# Patient Record
Sex: Female | Born: 1965 | Race: White | Hispanic: No | Smoking: Former smoker
Health system: Southern US, Community
[De-identification: ages and names within clinical notes are randomized; demographics above are authoritative.]

## PROBLEM LIST (undated history)

## (undated) DIAGNOSIS — M797 Fibromyalgia: Secondary | ICD-10-CM

## (undated) DIAGNOSIS — J189 Pneumonia, unspecified organism: Secondary | ICD-10-CM

## (undated) DIAGNOSIS — K219 Gastro-esophageal reflux disease without esophagitis: Secondary | ICD-10-CM

## (undated) DIAGNOSIS — F32A Depression, unspecified: Secondary | ICD-10-CM

## (undated) DIAGNOSIS — M549 Dorsalgia, unspecified: Secondary | ICD-10-CM

## (undated) DIAGNOSIS — F329 Major depressive disorder, single episode, unspecified: Secondary | ICD-10-CM

## (undated) DIAGNOSIS — J449 Chronic obstructive pulmonary disease, unspecified: Secondary | ICD-10-CM

## (undated) DIAGNOSIS — I5181 Takotsubo syndrome: Secondary | ICD-10-CM

## (undated) DIAGNOSIS — I10 Essential (primary) hypertension: Principal | ICD-10-CM

## (undated) DIAGNOSIS — M542 Cervicalgia: Secondary | ICD-10-CM

## (undated) DIAGNOSIS — F419 Anxiety disorder, unspecified: Secondary | ICD-10-CM

## (undated) DIAGNOSIS — K589 Irritable bowel syndrome without diarrhea: Secondary | ICD-10-CM

## (undated) HISTORY — PX: CHOLECYSTECTOMY: SHX55

## (undated) HISTORY — PX: OTHER SURGICAL HISTORY: SHX169

## (undated) HISTORY — DX: Essential (primary) hypertension: I10

## (undated) HISTORY — PX: CARDIAC CATHETERIZATION: SHX172

## (undated) HISTORY — PX: ABDOMINAL HYSTERECTOMY: SHX81

---

## 2013-05-02 ENCOUNTER — Inpatient Hospital Stay (HOSPITAL_COMMUNITY)
Admission: AD | Admit: 2013-05-02 | Discharge: 2013-05-06 | DRG: 125 | Disposition: A | Discharge status: Home / Self Care | Payer: BC Managed Care – PPO | Source: Other Acute Inpatient Hospital | Attending: Cardiology | Admitting: Cardiology

## 2013-05-02 DIAGNOSIS — Z881 Allergy status to other antibiotic agents status: Secondary | ICD-10-CM

## 2013-05-02 DIAGNOSIS — F329 Major depressive disorder, single episode, unspecified: Secondary | ICD-10-CM | POA: Diagnosis present

## 2013-05-02 DIAGNOSIS — I214 Non-ST elevation (NSTEMI) myocardial infarction: Secondary | ICD-10-CM | POA: Diagnosis present

## 2013-05-02 DIAGNOSIS — Z9981 Dependence on supplemental oxygen: Secondary | ICD-10-CM

## 2013-05-02 DIAGNOSIS — F17200 Nicotine dependence, unspecified, uncomplicated: Secondary | ICD-10-CM | POA: Diagnosis not present

## 2013-05-02 DIAGNOSIS — K219 Gastro-esophageal reflux disease without esophagitis: Secondary | ICD-10-CM | POA: Diagnosis present

## 2013-05-02 DIAGNOSIS — M542 Cervicalgia: Secondary | ICD-10-CM | POA: Diagnosis present

## 2013-05-02 DIAGNOSIS — F3289 Other specified depressive episodes: Secondary | ICD-10-CM | POA: Diagnosis present

## 2013-05-02 DIAGNOSIS — M549 Dorsalgia, unspecified: Secondary | ICD-10-CM | POA: Diagnosis present

## 2013-05-02 DIAGNOSIS — J449 Chronic obstructive pulmonary disease, unspecified: Secondary | ICD-10-CM | POA: Diagnosis present

## 2013-05-02 DIAGNOSIS — R079 Chest pain, unspecified: Secondary | ICD-10-CM

## 2013-05-02 DIAGNOSIS — K589 Irritable bowel syndrome without diarrhea: Secondary | ICD-10-CM | POA: Diagnosis present

## 2013-05-02 DIAGNOSIS — F419 Anxiety disorder, unspecified: Secondary | ICD-10-CM | POA: Diagnosis present

## 2013-05-02 DIAGNOSIS — I5181 Takotsubo syndrome: Principal | ICD-10-CM | POA: Diagnosis present

## 2013-05-02 DIAGNOSIS — E876 Hypokalemia: Secondary | ICD-10-CM | POA: Diagnosis present

## 2013-05-02 DIAGNOSIS — Z7982 Long term (current) use of aspirin: Secondary | ICD-10-CM

## 2013-05-02 DIAGNOSIS — F411 Generalized anxiety disorder: Secondary | ICD-10-CM | POA: Diagnosis present

## 2013-05-02 DIAGNOSIS — IMO0001 Reserved for inherently not codable concepts without codable children: Secondary | ICD-10-CM | POA: Diagnosis present

## 2013-05-02 DIAGNOSIS — R7309 Other abnormal glucose: Secondary | ICD-10-CM | POA: Diagnosis present

## 2013-05-02 DIAGNOSIS — E43 Unspecified severe protein-calorie malnutrition: Secondary | ICD-10-CM | POA: Insufficient documentation

## 2013-05-02 DIAGNOSIS — T380X5A Adverse effect of glucocorticoids and synthetic analogues, initial encounter: Secondary | ICD-10-CM | POA: Diagnosis present

## 2013-05-02 DIAGNOSIS — IMO0002 Reserved for concepts with insufficient information to code with codable children: Secondary | ICD-10-CM

## 2013-05-02 DIAGNOSIS — I2589 Other forms of chronic ischemic heart disease: Secondary | ICD-10-CM | POA: Diagnosis present

## 2013-05-02 DIAGNOSIS — J441 Chronic obstructive pulmonary disease with (acute) exacerbation: Secondary | ICD-10-CM | POA: Diagnosis not present

## 2013-05-02 DIAGNOSIS — R739 Hyperglycemia, unspecified: Secondary | ICD-10-CM | POA: Diagnosis present

## 2013-05-02 HISTORY — DX: Irritable bowel syndrome, unspecified: K58.9

## 2013-05-02 HISTORY — DX: Chronic obstructive pulmonary disease, unspecified: J44.9

## 2013-05-02 HISTORY — DX: Takotsubo syndrome: I51.81

## 2013-05-02 HISTORY — DX: Pneumonia, unspecified organism: J18.9

## 2013-05-02 HISTORY — DX: Dorsalgia, unspecified: M54.9

## 2013-05-02 HISTORY — DX: Cervicalgia: M54.2

## 2013-05-02 HISTORY — DX: Anxiety disorder, unspecified: F41.9

## 2013-05-02 HISTORY — DX: Gastro-esophageal reflux disease without esophagitis: K21.9

## 2013-05-02 HISTORY — DX: Major depressive disorder, single episode, unspecified: F32.9

## 2013-05-02 HISTORY — DX: Depression, unspecified: F32.A

## 2013-05-02 HISTORY — DX: Fibromyalgia: M79.7

## 2013-05-02 LAB — MRSA PCR SCREENING: MRSA by PCR: NEGATIVE

## 2013-05-02 MED ORDER — ACETAMINOPHEN 325 MG PO TABS
650.0000 mg | ORAL_TABLET | ORAL | Status: DC | PRN
  Administered 2013-05-03 – 2013-05-05 (×3): 650 mg via ORAL
  Filled 2013-05-02 (×3): qty 2

## 2013-05-02 MED ORDER — BUDESONIDE-FORMOTEROL FUMARATE 160-4.5 MCG/ACT IN AERO
2.0000 | INHALATION_SPRAY | Freq: Two times a day (BID) | RESPIRATORY_TRACT | Status: DC
  Administered 2013-05-02 – 2013-05-04 (×4): 2 via RESPIRATORY_TRACT
  Filled 2013-05-02: qty 6

## 2013-05-02 MED ORDER — ALPRAZOLAM 0.5 MG PO TABS
1.0000 mg | ORAL_TABLET | Freq: Three times a day (TID) | ORAL | Status: DC | PRN
  Administered 2013-05-02 – 2013-05-03 (×3): 1 mg via ORAL
  Filled 2013-05-02: qty 2
  Filled 2013-05-02 (×2): qty 1
  Filled 2013-05-02: qty 2

## 2013-05-02 MED ORDER — PREDNISOLONE 5 MG PO TABS: 60.0000 mg | ORAL_TABLET | Freq: Every day | ORAL | Status: DC

## 2013-05-02 MED ORDER — TRAZODONE HCL 50 MG PO TABS
50.0000 mg | ORAL_TABLET | Freq: Every day | ORAL | Status: DC
  Administered 2013-05-02 – 2013-05-03 (×2): 50 mg via ORAL
  Filled 2013-05-02 (×3): qty 1

## 2013-05-02 MED ORDER — ONDANSETRON HCL 4 MG/2ML IJ SOLN: 4.0000 mg | Freq: Four times a day (QID) | INTRAMUSCULAR | Status: DC | PRN

## 2013-05-02 MED ORDER — ATORVASTATIN CALCIUM 80 MG PO TABS
80.0000 mg | ORAL_TABLET | Freq: Every day | ORAL | Status: DC
  Filled 2013-05-02 (×3): qty 1

## 2013-05-02 MED ORDER — ENOXAPARIN SODIUM 60 MG/0.6ML ~~LOC~~ SOLN
45.0000 mg | Freq: Two times a day (BID) | SUBCUTANEOUS | Status: DC
  Administered 2013-05-03 – 2013-05-04 (×3): 45 mg via SUBCUTANEOUS
  Filled 2013-05-02 (×5): qty 0.6

## 2013-05-02 MED ORDER — LEVOFLOXACIN 750 MG PO TABS
750.0000 mg | ORAL_TABLET | Freq: Every day | ORAL | Status: DC
  Administered 2013-05-03 – 2013-05-05 (×3): 750 mg via ORAL
  Filled 2013-05-02 (×4): qty 1

## 2013-05-02 MED ORDER — PREDNISONE 50 MG PO TABS
60.0000 mg | ORAL_TABLET | Freq: Every day | ORAL | Status: DC
  Administered 2013-05-03 – 2013-05-06 (×4): 60 mg via ORAL
  Filled 2013-05-02 (×5): qty 1

## 2013-05-02 MED ORDER — ASPIRIN EC 81 MG PO TBEC
81.0000 mg | DELAYED_RELEASE_TABLET | Freq: Every day | ORAL | Status: DC
  Administered 2013-05-03 – 2013-05-06 (×3): 81 mg via ORAL
  Filled 2013-05-02 (×4): qty 1

## 2013-05-02 MED ORDER — PANTOPRAZOLE SODIUM 40 MG PO TBEC
40.0000 mg | DELAYED_RELEASE_TABLET | Freq: Every day | ORAL | Status: DC
  Administered 2013-05-03: 40 mg via ORAL
  Filled 2013-05-02: qty 1

## 2013-05-02 MED ORDER — ZOLPIDEM TARTRATE 5 MG PO TABS: 5.0000 mg | ORAL_TABLET | Freq: Every evening | ORAL | Status: DC | PRN

## 2013-05-02 MED ORDER — GABAPENTIN 300 MG PO CAPS
600.0000 mg | ORAL_CAPSULE | Freq: Three times a day (TID) | ORAL | Status: DC
  Administered 2013-05-02 – 2013-05-03 (×4): 600 mg via ORAL
  Filled 2013-05-02 (×7): qty 2

## 2013-05-02 MED ORDER — DILTIAZEM HCL ER COATED BEADS 120 MG PO CP24
120.0000 mg | ORAL_CAPSULE | Freq: Every day | ORAL | Status: DC
  Administered 2013-05-02 – 2013-05-06 (×5): 120 mg via ORAL
  Filled 2013-05-02 (×6): qty 1

## 2013-05-02 MED ORDER — ALBUTEROL SULFATE (5 MG/ML) 0.5% IN NEBU
2.5000 mg | INHALATION_SOLUTION | RESPIRATORY_TRACT | Status: DC | PRN
  Administered 2013-05-02 – 2013-05-04 (×4): 2.5 mg via RESPIRATORY_TRACT
  Filled 2013-05-02 (×4): qty 0.5

## 2013-05-02 MED ORDER — NITROGLYCERIN 0.4 MG SL SUBL: 0.4000 mg | SUBLINGUAL_TABLET | SUBLINGUAL | Status: DC | PRN

## 2013-05-02 NOTE — H&P (Signed)
Sarah Chan is an 47 y.o. female.    Chief Complaint: Shortness of breath and chest pain  HPI: 47 y/o female with a PMH of oxygen-dependent COPD, intermittent tobacco abuse presenting as a transfer from [facility] where she originally presented for evaluation of progressive shortness of breath and chest pain.  She has a baseline of chronic dyspnnea on exertion after walking about 50-70 feet.  Over the last one week she complained of worsening of her baseline dyspnea on exertion that seems to occur when she started smoking again. She was seen by outside physician who started her on inhalers, antibiotics and Prednisone taper.  Over the last 4 days, she started to complain of rest chest pain which she describes as a tightness, 9/10, typically lasting for about a few minutes before spontaneous relief.  Her chest pain is non-radiating, associated with worsening shortness of breath and diaphoresis, but she denies nausea, vomiting, palpitation or syncope.  As part of her evaluation at [facility], she had EKG that showed sinus tachycardia with no ST elevation.  Her initial cardiac marker showed a troponin-I of 1.5 which is abnormal.  She was transferred to [facility] in consideration of cardiac catheterization.  She is currently chest pain free, but still complains of shortness of breath and has bilateral expiratory wheezes.   No past medical history on file.  1. Oxygen-dependent COPD 2. GERD 3. Fibromyalgia 4. Back Pain 5. Anxiety 6. Neck pain 7. Irritable bowel syndrome 8. Depression  No past surgical history on file.  1. Hysterectomy 2. Cholecystectomy 3. C-section  No family history on file.  No family history of premature CAD  Social History:  has no tobacco, alcohol, and drug history on file.  Smoke 1/2 pack per day of cigarette  Allergies: Allergies not on file  Dicyclomine   No prescriptions prior to admission  1.  Prednisone 60  Mg qd 2. Gabapentine  600 mg tid 3. Trazodone 50 mg qhs 4. Moxifloxacin 400 mg qd 5. Xanax 0.5 mg tid  No results found for this or any previous visit (from the past 48 hour(s)). No results found.  Review of Systems  Constitutional: Negative for fever, chills, weight loss, malaise/fatigue and diaphoresis.  HENT: Negative for hearing loss, ear pain, nosebleeds, congestion, sore throat, neck pain, tinnitus and ear discharge.   Eyes: Negative for blurred vision, double vision, photophobia, pain, discharge and redness.  Respiratory: Positive for shortness of breath and wheezing. Negative for cough, hemoptysis, sputum production and stridor.   Cardiovascular: Positive for chest pain and PND. Negative for palpitations, orthopnea, claudication and leg swelling.  Gastrointestinal: Negative for heartburn, nausea, vomiting, abdominal pain, diarrhea and constipation.  Genitourinary: Negative for dysuria and urgency.  Musculoskeletal: Positive for myalgias and back pain. Negative for joint pain.  Skin: Negative for itching and rash.  Neurological: Negative for dizziness, tingling, weakness and headaches.  Psychiatric/Behavioral: Negative for hallucinations.    Temperature 98 F (36.7 C), temperature source Oral. Physical Exam  Constitutional: She is oriented to person, place, and time. She appears well-developed and well-nourished. No distress.  HENT:  Head: Normocephalic.  Eyes: Pupils are equal, round, and reactive to light. Right eye exhibits no discharge. Left eye exhibits no discharge. No scleral icterus.  Neck: Normal range of motion. Neck supple. No JVD present. No tracheal deviation present. No thyromegaly present.  Cardiovascular: Regular rhythm.  Exam reveals no gallop and no friction rub.   No murmur heard. Tachycardic  Respiratory: No stridor. No respiratory  distress. She has wheezes. She has no rales. She exhibits no tenderness.  Poor air movement with bilateral expiratory wheezes  GI: Soft. Bowel  sounds are normal. She exhibits no distension. There is no tenderness. There is no rebound and no guarding.  Musculoskeletal: Normal range of motion. She exhibits no edema and no tenderness.  Neurological: She is alert and oriented to person, place, and time. No cranial nerve deficit. Coordination normal.  Skin: No rash noted. She is not diaphoretic. No erythema.  Psychiatric: She has a normal mood and affect.    EKG from 05/02/13 at 14:21 shows sinus tachycardia (104 bpm), anteroseptal Q waves (no old EKG for comparison).    Assessment/Plan  1.  COPD exacerbation:  Patient has oxygen-dependent COPD and still smokes cigarettes.  She is currently on Levofloxacin, Prednisone and inhalers.  We will also start her on prn nebulizer treatment.  She has been advised on smoking cessation  2.  NSTEMI: could be secondary to demand ischemia from severe COPD, but cannot exclude underlying CAD since she smokes.  She is currently on Aspirin, Lipitor and Lovenox.  We will hold off on beta-blockers as she is actively wheezing and substitute Diltiazem to decrease her heart rate.  We will obtain serial cardiac markers to see which was they are trending and obtain a TTE in the morning to evaluate her LV function. We will keep her NPO on Sunday night in consideration of a cardiac catheterization on Monday.  3. GERD: We will start the patient of PPI  4. Anxiety: patient is on Xanax prn  Akeiba Axelson E 05/02/2013, 10:27 PM

## 2013-05-02 NOTE — Progress Notes (Signed)
ANTICOAGULATION CONSULT NOTE - Initial Consult  Pharmacy Consult for Lovenox Indication: chest pain/ACS  Allergies  Allergen Reactions  . Doxycycline Nausea Only    Patient Measurements: Height: 5\' 3"  (160 cm) Weight: 98 lb 8.7 oz (44.7 kg) IBW/kg (Calculated) : 52.4  Vital Signs: Temp: 98 F (36.7 C) (07/26 2116) Temp src: Oral (07/26 2116) BP: 133/107 mmHg (07/26 2300)  Labs (at [facility]): CK-MB  8.6 Trop I    1.51  WBC 11.5 Hgb 13.1 Hct 42.3 Plt 225  SCr  0.63 No results found for this basename: HGB, HCT, PLT, APTT, LABPROT, INR, HEPARINUNFRC, CREATININE, CKTOTAL, CKMB, TROPONINI,  in the last 72 hours  CrCl is unknown because no creatinine reading has been taken.   Medical History: COPD  Htn  GERD  Fibromyalgia     Medications:  Symbicort  Prednisone  NEurontin  Trazodone  Avelox  Astepro  Spiriva  Norco  Xanax    Assessment: 47 yo female with elevated cardiac markers for Lovenox  Goal of Therapy:  Full anticoagulation with Lovenox Monitor platelets by anticoagulation protocol: Yes   Plan:  Lovenox 45 mg SQ q12h  Eddie Candle 05/02/2013,11:43 PM

## 2013-05-03 ENCOUNTER — Inpatient Hospital Stay (HOSPITAL_COMMUNITY): Payer: BC Managed Care – PPO

## 2013-05-03 ENCOUNTER — Encounter (HOSPITAL_COMMUNITY): Payer: Self-pay | Admitting: *Deleted

## 2013-05-03 DIAGNOSIS — Z9981 Dependence on supplemental oxygen: Secondary | ICD-10-CM | POA: Diagnosis not present

## 2013-05-03 DIAGNOSIS — I5181 Takotsubo syndrome: Secondary | ICD-10-CM | POA: Diagnosis not present

## 2013-05-03 DIAGNOSIS — J441 Chronic obstructive pulmonary disease with (acute) exacerbation: Secondary | ICD-10-CM | POA: Diagnosis not present

## 2013-05-03 DIAGNOSIS — F17200 Nicotine dependence, unspecified, uncomplicated: Secondary | ICD-10-CM | POA: Diagnosis not present

## 2013-05-03 DIAGNOSIS — I369 Nonrheumatic tricuspid valve disorder, unspecified: Secondary | ICD-10-CM

## 2013-05-03 LAB — COMPREHENSIVE METABOLIC PANEL
Albumin: 3.7 g/dL (ref 3.5–5.2)
BUN: 8 mg/dL (ref 6–23)
Creatinine, Ser: 0.55 mg/dL (ref 0.50–1.10)
GFR calc Af Amer: >90 mL/min (ref >90)
Glucose, Bld: 134 mg/dL — ABNORMAL HIGH (ref 70–99)
Total Protein: 6.8 g/dL (ref 6.0–8.3)

## 2013-05-03 LAB — PRO B NATRIURETIC PEPTIDE: Pro B Natriuretic peptide (BNP): 6072 pg/mL — ABNORMAL HIGH (ref 0–125)

## 2013-05-03 LAB — LIPID PANEL
Cholesterol: 164 mg/dL (ref 0–200)
VLDL: 15 mg/dL (ref 0–40)

## 2013-05-03 LAB — CBC WITH DIFFERENTIAL/PLATELET
Basophils Absolute: 0 10*3/uL (ref 0.0–0.1)
Basophils Relative: 0 % (ref 0–1)
Eosinophils Absolute: 0 10*3/uL (ref 0.0–0.7)
Eosinophils Relative: 0 % (ref 0–5)
MCH: 28.1 pg (ref 26.0–34.0)
MCHC: 30.1 g/dL (ref 30.0–36.0)
MCV: 93.5 fL (ref 78.0–100.0)
Platelets: 262 10*3/uL (ref 150–400)
RDW: 13.6 % (ref 11.5–15.5)
WBC: 9.9 10*3/uL (ref 4.0–10.5)

## 2013-05-03 LAB — TROPONIN I
Troponin I: 2.3 ng/mL (ref <0.30)
Troponin I: 2.32 ng/mL (ref <0.30)
Troponin I: 1.45 ng/mL (ref <0.30)

## 2013-05-03 LAB — CBC
Hemoglobin: 13 g/dL (ref 12.0–15.0)
MCH: 28.7 pg (ref 26.0–34.0)
MCV: 92.1 fL (ref 78.0–100.0)
Platelets: 245 10*3/uL (ref 150–400)
RBC: 4.53 MIL/uL (ref 3.87–5.11)
WBC: 9.4 10*3/uL (ref 4.0–10.5)

## 2013-05-03 LAB — BASIC METABOLIC PANEL
CO2: 36 mEq/L — ABNORMAL HIGH (ref 19–32)
Calcium: 8.9 mg/dL (ref 8.4–10.5)
Chloride: 99 mEq/L (ref 96–112)
Glucose, Bld: 128 mg/dL — ABNORMAL HIGH (ref 70–99)
Sodium: 141 mEq/L (ref 135–145)

## 2013-05-03 LAB — MAGNESIUM: Magnesium: 2 mg/dL (ref 1.5–2.5)

## 2013-05-03 LAB — HEMOGLOBIN A1C: Hgb A1c MFr Bld: 5.5 % (ref <5.7)

## 2013-05-03 LAB — PROTIME-INR: Prothrombin Time: 13.6 seconds (ref 11.6–15.2)

## 2013-05-03 LAB — TSH: TSH: 0.376 u[IU]/mL (ref 0.350–4.500)

## 2013-05-03 MED ORDER — DIAZEPAM 2 MG PO TABS
2.0000 mg | ORAL_TABLET | ORAL | Status: AC
  Administered 2013-05-04: 2 mg via ORAL
  Filled 2013-05-03: qty 1

## 2013-05-03 MED ORDER — ASPIRIN 81 MG PO CHEW
324.0000 mg | CHEWABLE_TABLET | ORAL | Status: AC
  Administered 2013-05-04: 324 mg via ORAL
  Filled 2013-05-03: qty 4

## 2013-05-03 MED ORDER — SODIUM CHLORIDE 0.9 % IJ SOLN
3.0000 mL | Freq: Two times a day (BID) | INTRAMUSCULAR | Status: DC
  Administered 2013-05-03 – 2013-05-05 (×4): 3 mL via INTRAVENOUS

## 2013-05-03 MED ORDER — SODIUM CHLORIDE 0.9 % IJ SOLN: 3.0000 mL | INTRAMUSCULAR | Status: DC | PRN

## 2013-05-03 MED ORDER — SODIUM CHLORIDE 0.9 % IV SOLN: 250.0000 mL | INTRAVENOUS | Status: DC | PRN

## 2013-05-03 MED ORDER — FLUTICASONE PROPIONATE 50 MCG/ACT NA SUSP
1.0000 | Freq: Every day | NASAL | Status: DC
  Administered 2013-05-03 – 2013-05-06 (×4): 1 via NASAL
  Filled 2013-05-03: qty 16

## 2013-05-03 NOTE — Progress Notes (Signed)
Subjective:  Patient remains very dyspneic. Still has dull aching anterior chest discomfort.  Objective:  Vital Signs in the last 24 hours: Temp:  [98 F (36.7 C)-98.7 F (37.1 C)] 98.3 F (36.8 C) (07/27 0800) Pulse Rate:  [113] 113 (07/27 0800) Resp:  [18-25] 19 (07/27 0405) BP: (109-137)/(83-113) 109/83 mmHg (07/27 0800) SpO2:  [96 %-100 %] 96 % (07/27 0922) Weight:  [98 lb 8.7 oz (44.7 kg)] 98 lb 8.7 oz (44.7 kg) (07/26 2333)  Intake/Output from previous day: 07/26 0701 - 07/27 0700 In: 240 [P.O.:240] Out: 301 [Urine:300; Stool:1] Intake/Output from this shift: Total I/O In: 480 [P.O.:480] Out: 400 [Urine:400]  . aspirin EC  81 mg Oral Daily  . atorvastatin  80 mg Oral q1800  . budesonide-formoterol  2 puff Inhalation BID  . diltiazem  120 mg Oral Daily  . enoxaparin (LOVENOX) injection  45 mg Subcutaneous BID  . gabapentin  600 mg Oral TID  . levofloxacin  750 mg Oral Daily  . pantoprazole  40 mg Oral Daily  . predniSONE  60 mg Oral Q breakfast  . traZODone  50 mg Oral QHS      Physical Exam: The patient appears to be in moderate distress.  Head and neck exam normal JVP.  Chest reveals distant breath sounds. Mild wheezes. Marked increase AP diameter c/w severe COPD  Heart reveals sinus tachycardia. No rub. No gallop or murmur.  The abdomen is soft and nontender.  Bowel sounds are normoactive.  There is no hepatosplenomegaly or mass.  There are no abdominal bruits.  Extremities reveal no phlebitis or edema.  Pedal pulses are good.  There is no cyanosis or clubbing. There is no paradoxical pulse.  Neurologic exam is normal strength and no lateralizing weakness.  No sensory deficits.  Integument reveals no rash  Lab Results:  Recent Labs  05/02/13 0005 05/03/13 0450  WBC 9.9 9.4  HGB 13.0 13.0  PLT 262 245    Recent Labs  05/02/13 0005 05/03/13 0450  NA 138 141  K 3.4* 3.6  CL 95* 99  CO2 37* 36*  GLUCOSE 134* 128*  BUN 8 9  CREATININE  0.55 0.55    Recent Labs  05/02/13 2221 05/03/13 0450  TROPONINI 2.30* 2.32*   Hepatic Function Panel  Recent Labs  05/02/13 0005  PROT 6.8  ALBUMIN 3.7  AST 27  ALT 18  ALKPHOS 67  BILITOT 0.3    Recent Labs  05/03/13 0450  CHOL 164   No results found for this basename: PROTIME,  in the last 72 hours  Imaging: Imaging results have been reviewed. No chest xray available .  Will order.  Cardiac Studies: 2D echo: Study Conclusions  - Left ventricle: The cavity size was normal. Wall thickness was increased in a pattern of mild LVH. Systolic function was severely reduced. The estimated ejection fraction was in the range of 25% to 30%. There is severe hypokinesis of the mid-distalanteroseptal and apical myocardium. Doppler parameters are consistent with abnormal left ventricular relaxation (grade 1 diastolic dysfunction). - Pulmonary arteries: PA peak pressure: 46mm Hg (S).  EKG: Sinus tachy.  QS pattern in V1-V3 consistent with ASMI uncertain age. Troponins plateauing. Assessment/Plan:  1. COPD exacerbation: Patient has oxygen-dependent COPD and still smokes cigarettes. She is currently on Levofloxacin, Prednisone and inhalers. We will also start her on prn nebulizer treatment. She has been advised on smoking cessation  2. NSTEMI: could be secondary to demand ischemia from severe COPD, but cannot  exclude underlying CAD since she smokes. She is currently on Aspirin, Lipitor and Lovenox. We will hold off on beta-blockers as she is actively wheezing and substitute Diltiazem to decrease her heart rate. 2D echo shows evidence of ASMI with severe LV systolic dysfunction.. We will keep her NPO on Sunday night and plan cardiac catheterization on Monday.  3. GERD: We will start the patient of PPI  4. Anxiety: patient is on Xanax prn   LOS: 1 day    Sarah Chan 05/03/2013, 10:35 AM

## 2013-05-03 NOTE — Progress Notes (Signed)
MD aware of positive Troponin markers; no new orders

## 2013-05-03 NOTE — Progress Notes (Signed)
*  PRELIMINARY RESULTS* Echocardiogram 2D Echocardiogram has been performed.  Sarah Chan 05/03/2013, 11:26 AM

## 2013-05-04 ENCOUNTER — Encounter (HOSPITAL_COMMUNITY): Payer: Self-pay | Admitting: Physician Assistant

## 2013-05-04 ENCOUNTER — Encounter (HOSPITAL_COMMUNITY)
Admission: AD | Disposition: A | Discharge status: Home / Self Care | Payer: Self-pay | Source: Other Acute Inpatient Hospital | Attending: Cardiology

## 2013-05-04 DIAGNOSIS — I5181 Takotsubo syndrome: Secondary | ICD-10-CM | POA: Diagnosis not present

## 2013-05-04 DIAGNOSIS — E43 Unspecified severe protein-calorie malnutrition: Secondary | ICD-10-CM | POA: Insufficient documentation

## 2013-05-04 DIAGNOSIS — I214 Non-ST elevation (NSTEMI) myocardial infarction: Secondary | ICD-10-CM

## 2013-05-04 DIAGNOSIS — J441 Chronic obstructive pulmonary disease with (acute) exacerbation: Secondary | ICD-10-CM | POA: Diagnosis not present

## 2013-05-04 DIAGNOSIS — J449 Chronic obstructive pulmonary disease, unspecified: Secondary | ICD-10-CM

## 2013-05-04 DIAGNOSIS — F411 Generalized anxiety disorder: Secondary | ICD-10-CM

## 2013-05-04 DIAGNOSIS — F17200 Nicotine dependence, unspecified, uncomplicated: Secondary | ICD-10-CM | POA: Diagnosis not present

## 2013-05-04 DIAGNOSIS — F419 Anxiety disorder, unspecified: Secondary | ICD-10-CM | POA: Diagnosis present

## 2013-05-04 DIAGNOSIS — K219 Gastro-esophageal reflux disease without esophagitis: Secondary | ICD-10-CM | POA: Diagnosis present

## 2013-05-04 DIAGNOSIS — Z9981 Dependence on supplemental oxygen: Secondary | ICD-10-CM | POA: Diagnosis not present

## 2013-05-04 DIAGNOSIS — R739 Hyperglycemia, unspecified: Secondary | ICD-10-CM | POA: Diagnosis present

## 2013-05-04 HISTORY — PX: LEFT HEART CATHETERIZATION WITH CORONARY ANGIOGRAM: SHX5451

## 2013-05-04 LAB — PROTIME-INR: INR: 1.03 (ref 0.00–1.49)

## 2013-05-04 LAB — CBC
HCT: 43.1 % (ref 36.0–46.0)
Hemoglobin: 13 g/dL (ref 12.0–15.0)
MCHC: 30.2 g/dL (ref 30.0–36.0)

## 2013-05-04 SURGERY — LEFT HEART CATHETERIZATION WITH CORONARY ANGIOGRAM
Anesthesia: LOCAL

## 2013-05-04 MED ORDER — AZELASTINE HCL 0.15 % NA SOLN: 1.0000 "application " | Freq: Every day | NASAL | Status: DC

## 2013-05-04 MED ORDER — HEPARIN (PORCINE) IN NACL 2-0.9 UNIT/ML-% IJ SOLN
INTRAMUSCULAR | Status: AC
  Filled 2013-05-04: qty 1000

## 2013-05-04 MED ORDER — TIOTROPIUM BROMIDE MONOHYDRATE 18 MCG IN CAPS
18.0000 ug | ORAL_CAPSULE | Freq: Every day | RESPIRATORY_TRACT | Status: DC
  Administered 2013-05-05 – 2013-05-06 (×2): 18 ug via RESPIRATORY_TRACT
  Filled 2013-05-04: qty 5

## 2013-05-04 MED ORDER — TRAZODONE HCL 50 MG PO TABS
50.0000 mg | ORAL_TABLET | Freq: Every day | ORAL | Status: DC
  Administered 2013-05-04 – 2013-05-05 (×2): 50 mg via ORAL
  Filled 2013-05-04 (×3): qty 1

## 2013-05-04 MED ORDER — ALBUTEROL SULFATE (5 MG/ML) 0.5% IN NEBU
2.5000 mg | INHALATION_SOLUTION | Freq: Four times a day (QID) | RESPIRATORY_TRACT | Status: DC | PRN
  Administered 2013-05-04 – 2013-05-06 (×4): 2.5 mg via RESPIRATORY_TRACT
  Filled 2013-05-04 (×4): qty 0.5

## 2013-05-04 MED ORDER — FLUOXETINE HCL 20 MG PO CAPS
80.0000 mg | ORAL_CAPSULE | Freq: Every day | ORAL | Status: DC
  Administered 2013-05-04 – 2013-05-06 (×3): 80 mg via ORAL
  Filled 2013-05-04 (×3): qty 4

## 2013-05-04 MED ORDER — MIDAZOLAM HCL 2 MG/2ML IJ SOLN
INTRAMUSCULAR | Status: AC
  Filled 2013-05-04: qty 2

## 2013-05-04 MED ORDER — ALPRAZOLAM 0.5 MG PO TABS
0.5000 mg | ORAL_TABLET | Freq: Two times a day (BID) | ORAL | Status: DC | PRN
  Administered 2013-05-04 – 2013-05-05 (×2): 0.5 mg via ORAL
  Filled 2013-05-04 (×2): qty 1

## 2013-05-04 MED ORDER — GUAIFENESIN ER 600 MG PO TB12
1200.0000 mg | ORAL_TABLET | Freq: Two times a day (BID) | ORAL | Status: DC
  Administered 2013-05-04 – 2013-05-06 (×5): 1200 mg via ORAL
  Filled 2013-05-04 (×6): qty 2

## 2013-05-04 MED ORDER — FENTANYL CITRATE 0.05 MG/ML IJ SOLN
INTRAMUSCULAR | Status: AC
  Filled 2013-05-04: qty 2

## 2013-05-04 MED ORDER — ALPRAZOLAM 0.5 MG PO TABS
1.0000 mg | ORAL_TABLET | Freq: Every day | ORAL | Status: DC
  Administered 2013-05-04 – 2013-05-06 (×3): 1 mg via ORAL
  Filled 2013-05-04 (×3): qty 2

## 2013-05-04 MED ORDER — AZELASTINE HCL 0.1 % NA SOLN
1.0000 | Freq: Every day | NASAL | Status: DC
  Administered 2013-05-04 – 2013-05-05 (×2): 1 via NASAL
  Filled 2013-05-04: qty 30

## 2013-05-04 MED ORDER — GABAPENTIN 600 MG PO TABS
600.0000 mg | ORAL_TABLET | Freq: Three times a day (TID) | ORAL | Status: DC
  Administered 2013-05-04 – 2013-05-06 (×7): 600 mg via ORAL
  Filled 2013-05-04 (×11): qty 1

## 2013-05-04 MED ORDER — BUDESONIDE-FORMOTEROL FUMARATE 160-4.5 MCG/ACT IN AERO
2.0000 | INHALATION_SPRAY | Freq: Two times a day (BID) | RESPIRATORY_TRACT | Status: DC
  Administered 2013-05-04 – 2013-05-06 (×4): 2 via RESPIRATORY_TRACT
  Filled 2013-05-04: qty 6

## 2013-05-04 MED ORDER — FLUOXETINE HCL 40 MG PO CAPS: 80.0000 mg | ORAL_CAPSULE | Freq: Every day | ORAL | Status: DC

## 2013-05-04 MED ORDER — SODIUM CHLORIDE 0.9 % IV SOLN: INTRAVENOUS | Status: DC

## 2013-05-04 MED ORDER — SODIUM CHLORIDE 0.9 % IV SOLN: INTRAVENOUS | Status: AC

## 2013-05-04 MED ORDER — NITROGLYCERIN 0.2 MG/ML ON CALL CATH LAB
INTRAVENOUS | Status: AC
  Filled 2013-05-04: qty 1

## 2013-05-04 MED ORDER — HYDROCODONE-ACETAMINOPHEN 7.5-325 MG PO TABS: 1.0000 | ORAL_TABLET | Freq: Four times a day (QID) | ORAL | Status: DC | PRN

## 2013-05-04 MED ORDER — LIDOCAINE HCL (PF) 1 % IJ SOLN
INTRAMUSCULAR | Status: AC
  Filled 2013-05-04: qty 30

## 2013-05-04 MED ORDER — PANTOPRAZOLE SODIUM 40 MG PO TBEC
40.0000 mg | DELAYED_RELEASE_TABLET | Freq: Every day | ORAL | Status: DC
  Administered 2013-05-04 – 2013-05-06 (×3): 40 mg via ORAL
  Filled 2013-05-04 (×3): qty 1

## 2013-05-04 NOTE — Progress Notes (Addendum)
Patient Name: Sarah Chan Date of Encounter: 05/04/2013  Principal Problem:   Non-ST elevation myocardial infarction (NSTEMI), initial episode of care Active Problems:   Ischemic cardiomyopathy   COPD (chronic obstructive pulmonary disease)   Anxiety   GERD (gastroesophageal reflux disease)   Hyperglycemia   SUBJECTIVE: Chest pain improved. SOB a little better than yesterday. Anxious, takes Xanax at home. Thinks she can do cath today. Normally drinks a lot of fluids to help loosen congestion so she can cough it up. On home O2.  OBJECTIVE Filed Vitals:   05/04/13 0400 05/04/13 0500 05/04/13 0723 05/04/13 0743  BP: 79/61 91/65 80/63    Pulse:   67   Temp: 97.8 F (36.6 C)  98.3 F (36.8 C)   TempSrc: Oral  Oral   Resp: 16 22 16    Height:      Weight:      SpO2: 96% 96% 96% 98%    Intake/Output Summary (Last 24 hours) at 05/04/13 0845 Last data filed at 05/03/13 2135  Gross per 24 hour  Intake   1283 ml  Output    900 ml  Net    383 ml   Filed Weights   05/02/13 2130 05/02/13 2333 05/03/13 1929  Weight: 98 lb 8.7 oz (44.7 kg) 98 lb 8.7 oz (44.7 kg) 97 lb 10.6 oz (44.3 kg)    PHYSICAL EXAM General: Well developed, well nourished, female, anxious, but in no acute pain. Head: Normocephalic, atraumatic.  Neck: Supple without bruits, JVD 8 cm. Lungs:  Resp regular and unlabored, decreased BS bilaterally with rales, no rhonchi. Heart: RRR, S1, S2, no S3, S4, or murmur; no rub. Abdomen: Soft, non-tender, non-distended, BS + x 4.  Extremities: No clubbing, cyanosis, no edema.  Neuro: Alert and oriented X 3. Moves all extremities spontaneously. Psych: Normal affect.  LABS: CBC:  Recent Labs  05/02/13 0005 05/03/13 0450 05/04/13 0435  WBC 9.9 9.4 11.9*  NEUTROABS 8.6*  --   --   HGB 13.0 13.0 13.0  HCT 43.2 41.7 43.1  MCV 93.5 92.1 92.1  PLT 262 245 232   INR:  Recent Labs  05/04/13 0435  INR 1.03   Basic Metabolic Panel:  Recent Labs   05/02/13 0005 05/03/13 0450  NA 138 141  K 3.4* 3.6  CL 95* 99  CO2 37* 36*  GLUCOSE 134* 128*  BUN 8 9  CREATININE 0.55 0.55  CALCIUM 8.9 8.9  MG 2.0  --    Liver Function Tests:  Recent Labs  05/02/13 0005  AST 27  ALT 18  ALKPHOS 67  BILITOT 0.3  PROT 6.8  ALBUMIN 3.7   Cardiac Enzymes:  Recent Labs  05/02/13 2221 05/03/13 0450 05/03/13 1040  TROPONINI 2.30* 2.32* 1.45*   BNP: Pro B Natriuretic peptide (BNP)  Date/Time Value Range Status  05/02/2013 10:21 PM 6072.0* 0 - 125 pg/mL Final   Hemoglobin A1C:  Recent Labs  05/02/13 0005  HGBA1C 5.5   Fasting Lipid Panel:  Recent Labs  05/03/13 0450  CHOL 164  HDL 80  LDLCALC 69  TRIG 77  CHOLHDL 2.1   Thyroid Function Tests:  Recent Labs  05/02/13 0005  TSH 0.376    TELE:  SR, no significant ectopy      ECG: evolving MI changes  ECHO : 05/03/2013 Study Conclusions - Left ventricle: The cavity size was normal. Wall thickness was increased in a pattern of mild LVH. Systolic function was severely reduced. The estimated ejection fraction was  in the range of 25% to 30%. There is severe hypokinesis of the mid-distalanteroseptal and apical myocardium. Doppler parameters are consistent with abnormal left ventricular relaxation (grade 1 diastolic dysfunction). - Pulmonary arteries: PA peak pressure: 46mm Hg (S).    Radiology/Studies: Dg Chest Port 1 View 05/03/2013   *RADIOLOGY REPORT*  Clinical Data: COPD.  PORTABLE CHEST - 1 VIEW  Comparison: 05/02/2013  Findings: The lungs are markedly hyperinflated consistent with emphysema.  There is peribronchial thickening which is unchanged. No infiltrates or effusions.  Heart size and vascularity are normal.  No acute osseous abnormality.  IMPRESSION: Hyperinflated lungs consistent with COPD.  No acute abnormality.   Original Report Authenticated By: Francene Boyers, M.D.     Current Medications:  . aspirin EC  81 mg Oral Daily  . atorvastatin  80 mg  Oral q1800  . budesonide-formoterol  2 puff Inhalation BID  . diazepam  2 mg Oral On Call  . diltiazem  120 mg Oral Daily  . enoxaparin (LOVENOX) injection  45 mg Subcutaneous BID  . fluticasone  1 spray Each Nare Daily  . gabapentin  600 mg Oral TID  . levofloxacin  750 mg Oral Daily  . pantoprazole  40 mg Oral Daily  . predniSONE  60 mg Oral Q breakfast  . sodium chloride  3 mL Intravenous Q12H  . traZODone  50 mg Oral QHS      ASSESSMENT AND PLAN: Principal Problem:   Non-ST elevation myocardial infarction (NSTEMI), initial episode of care - cath today, EF decreased, on ASA, statin, no BB secondary to COPD  Active Problems:   Ischemic cardiomyopathy - BP too low for ACE right now, MD advise on d/c Dilt to add ACE.    COPD (chronic obstructive pulmonary disease) - on steroids at high dose since admit, was on 40 mg as part of taper PTA. On ABX for possible URI PTA, MD advise on duration of ABX and steroid taper, may need pulm help. Add Mucinex. Reorder home Rx.    Anxiety - give 1 dose Xanax qam and continue Rx PTA. She was on 0.5 mg TID PRN PTA.    GERD (gastroesophageal reflux disease) - on PPI    Hyperglycemia - A1c OK, likely secondary to steroids.  Plan - cath today. Signed, Theodore Demark , PA-C 8:45 AM 05/04/2013  Patient seen, examined. Available data reviewed. Agree with findings, assessment, and plan as outlined by Theodore Demark, PA-C. Exam reveals thin, pleasant woman in NAD. Femoral pulses 2+. EKG reveals marked T wave abnormality with QT prolongation. Troponins positive. Hx consistent with NSTEMI, echo pattern raises question of Takotsubo's versus LAD infarct. Reviewed risks, indication, and alternatives to cath and possible PCI. She understands and agrees to proceed. Plan RFA approach as her wrist/radial are very small.  Tonny Bollman, M.D. 05/04/2013 1:33 PM

## 2013-05-04 NOTE — H&P (View-Only) (Signed)
 Patient Name: Sarah Chan Date of Encounter: 05/04/2013  Principal Problem:   Non-ST elevation myocardial infarction (NSTEMI), initial episode of care Active Problems:   Ischemic cardiomyopathy   COPD (chronic obstructive pulmonary disease)   Anxiety   GERD (gastroesophageal reflux disease)   Hyperglycemia   SUBJECTIVE: Chest pain improved. SOB a little better than yesterday. Anxious, takes Xanax at home. Thinks she can do cath today. Normally drinks a lot of fluids to help loosen congestion so she can cough it up. On home O2.  OBJECTIVE Filed Vitals:   05/04/13 0400 05/04/13 0500 05/04/13 0723 05/04/13 0743  BP: 79/61 91/65 80/63   Pulse:   67   Temp: 97.8 F (36.6 C)  98.3 F (36.8 C)   TempSrc: Oral  Oral   Resp: 16 22 16   Height:      Weight:      SpO2: 96% 96% 96% 98%    Intake/Output Summary (Last 24 hours) at 05/04/13 0845 Last data filed at 05/03/13 2135  Gross per 24 hour  Intake   1283 ml  Output    900 ml  Net    383 ml   Filed Weights   05/02/13 2130 05/02/13 2333 05/03/13 1929  Weight: 98 lb 8.7 oz (44.7 kg) 98 lb 8.7 oz (44.7 kg) 97 lb 10.6 oz (44.3 kg)    PHYSICAL EXAM General: Well developed, well nourished, female, anxious, but in no acute pain. Head: Normocephalic, atraumatic.  Neck: Supple without bruits, JVD 8 cm. Lungs:  Resp regular and unlabored, decreased BS bilaterally with rales, no rhonchi. Heart: RRR, S1, S2, no S3, S4, or murmur; no rub. Abdomen: Soft, non-tender, non-distended, BS + x 4.  Extremities: No clubbing, cyanosis, no edema.  Neuro: Alert and oriented X 3. Moves all extremities spontaneously. Psych: Normal affect.  LABS: CBC:  Recent Labs  05/02/13 0005 05/03/13 0450 05/04/13 0435  WBC 9.9 9.4 11.9*  NEUTROABS 8.6*  --   --   HGB 13.0 13.0 13.0  HCT 43.2 41.7 43.1  MCV 93.5 92.1 92.1  PLT 262 245 232   INR:  Recent Labs  05/04/13 0435  INR 1.03   Basic Metabolic Panel:  Recent Labs   05/02/13 0005 05/03/13 0450  NA 138 141  K 3.4* 3.6  CL 95* 99  CO2 37* 36*  GLUCOSE 134* 128*  BUN 8 9  CREATININE 0.55 0.55  CALCIUM 8.9 8.9  MG 2.0  --    Liver Function Tests:  Recent Labs  05/02/13 0005  AST 27  ALT 18  ALKPHOS 67  BILITOT 0.3  PROT 6.8  ALBUMIN 3.7   Cardiac Enzymes:  Recent Labs  05/02/13 2221 05/03/13 0450 05/03/13 1040  TROPONINI 2.30* 2.32* 1.45*   BNP: Pro B Natriuretic peptide (BNP)  Date/Time Value Range Status  05/02/2013 10:21 PM 6072.0* 0 - 125 pg/mL Final   Hemoglobin A1C:  Recent Labs  05/02/13 0005  HGBA1C 5.5   Fasting Lipid Panel:  Recent Labs  05/03/13 0450  CHOL 164  HDL 80  LDLCALC 69  TRIG 77  CHOLHDL 2.1   Thyroid Function Tests:  Recent Labs  05/02/13 0005  TSH 0.376    TELE:  SR, no significant ectopy      ECG: evolving MI changes  ECHO : 05/03/2013 Study Conclusions - Left ventricle: The cavity size was normal. Wall thickness was increased in a pattern of mild LVH. Systolic function was severely reduced. The estimated ejection fraction was   in the range of 25% to 30%. There is severe hypokinesis of the mid-distalanteroseptal and apical myocardium. Doppler parameters are consistent with abnormal left ventricular relaxation (grade 1 diastolic dysfunction). - Pulmonary arteries: PA peak pressure: 46mm Hg (S).    Radiology/Studies: Dg Chest Port 1 View 05/03/2013   *RADIOLOGY REPORT*  Clinical Data: COPD.  PORTABLE CHEST - 1 VIEW  Comparison: 05/02/2013  Findings: The lungs are markedly hyperinflated consistent with emphysema.  There is peribronchial thickening which is unchanged. No infiltrates or effusions.  Heart size and vascularity are normal.  No acute osseous abnormality.  IMPRESSION: Hyperinflated lungs consistent with COPD.  No acute abnormality.   Original Report Authenticated By: James Maxwell, M.D.     Current Medications:  . aspirin EC  81 mg Oral Daily  . atorvastatin  80 mg  Oral q1800  . budesonide-formoterol  2 puff Inhalation BID  . diazepam  2 mg Oral On Call  . diltiazem  120 mg Oral Daily  . enoxaparin (LOVENOX) injection  45 mg Subcutaneous BID  . fluticasone  1 spray Each Nare Daily  . gabapentin  600 mg Oral TID  . levofloxacin  750 mg Oral Daily  . pantoprazole  40 mg Oral Daily  . predniSONE  60 mg Oral Q breakfast  . sodium chloride  3 mL Intravenous Q12H  . traZODone  50 mg Oral QHS      ASSESSMENT AND PLAN: Principal Problem:   Non-ST elevation myocardial infarction (NSTEMI), initial episode of care - cath today, EF decreased, on ASA, statin, no BB secondary to COPD  Active Problems:   Ischemic cardiomyopathy - BP too low for ACE right now, MD advise on d/c Dilt to add ACE.    COPD (chronic obstructive pulmonary disease) - on steroids at high dose since admit, was on 40 mg as part of taper PTA. On ABX for possible URI PTA, MD advise on duration of ABX and steroid taper, may need pulm help. Add Mucinex. Reorder home Rx.    Anxiety - give 1 dose Xanax qam and continue Rx PTA. She was on 0.5 mg TID PRN PTA.    GERD (gastroesophageal reflux disease) - on PPI    Hyperglycemia - A1c OK, likely secondary to steroids.  Plan - cath today. Signed, Jada Barrett , PA-C 8:45 AM 05/04/2013  Patient seen, examined. Available data reviewed. Agree with findings, assessment, and plan as outlined by Takeesha Barrett, PA-C. Exam reveals thin, pleasant woman in NAD. Femoral pulses 2+. EKG reveals marked T wave abnormality with QT prolongation. Troponins positive. Hx consistent with NSTEMI, echo pattern raises question of Takotsubo's versus LAD infarct. Reviewed risks, indication, and alternatives to cath and possible PCI. She understands and agrees to proceed. Plan RFA approach as her wrist/radial are very small.  Michael Cooper, M.D. 05/04/2013 1:33 PM   

## 2013-05-04 NOTE — Interval H&P Note (Signed)
History and Physical Interval Note:  05/04/2013 2:15 PM  Sarah Chan  has presented today for surgery, with the diagnosis of cp  The various methods of treatment have been discussed with the patient and family. After consideration of risks, benefits and other options for treatment, the patient has consented to  Procedure(s): LEFT HEART CATHETERIZATION WITH CORONARY ANGIOGRAM (N/A) as a surgical intervention .  The patient's history has been reviewed, patient examined, no change in status, stable for surgery.  I have reviewed the patient's chart and labs.  Questions were answered to the patient's satisfaction.    Cath Lab Visit (complete for each Cath Lab visit)  Clinical Evaluation Leading to the Procedure:   ACS: yes  Non-ACS:    Anginal Classification: CCS IV  Anti-ischemic medical therapy: Minimal Therapy (1 class of medications)  Non-Invasive Test Results: No non-invasive testing performed  Prior CABG: No previous CABG         Tonny Bollman

## 2013-05-04 NOTE — CV Procedure (Signed)
   Cardiac Catheterization Procedure Note  Name: Sarah Chan MRN: [id number] DOB: 14-Sep-1966  Procedure: Left Heart Cath, Selective Coronary Angiography, LV angiography  Indication: NSTEMI, severe LV dysfunction  Procedural details: The right groin was prepped, draped, and anesthetized with 1% lidocaine. Using modified Seldinger technique, a 5 French sheath was introduced into the right femoral artery. Standard Judkins catheters were used for coronary angiography and left ventriculography. Catheter exchanges were performed over a guidewire. There were no immediate procedural complications. The patient was transferred to the post catheterization recovery area for further monitoring.  Procedural Findings: Hemodynamics:  AO 95/65 LV 94/7   Coronary angiography: Coronary dominance: right  Left mainstem: Widely patent without obstructive CAD  Left anterior descending (LAD): Patent throughout. No obstructive disease. Reaches the LV apex. Diagonal branches are patent.  Left circumflex (LCx): Patent, smooth vessel without disease. OM branches are patent.   Right coronary artery (RCA): Large, dominant vessel. No obstructive disease. PDA and PLA branches patent.  Left ventriculography: Left ventricular systolic function is severely reduced. There is anterior, periapical, and inferior hypokinesis. The heart is vertical in it's orientation. The basal segments are hyperdynamic. The estimated LVEF is 30%.  Final Conclusions:   1. Normal coronary arteries 2. Severe LV dysfunction with typical features of Takotsubo's Cardiomyopathy.  Tonny Bollman 05/04/2013, 2:57 PM

## 2013-05-04 NOTE — Progress Notes (Signed)
INITIAL NUTRITION ASSESSMENT  DOCUMENTATION CODES Per approved criteria  -Severe malnutrition in the context of chronic illness   INTERVENTION: 1.  Supplements; Ensure Complete po TID once diet resumed, each supplement provides 350 kcal and 13 grams of protein.  RD provided with recipes for making high-calorie Ensure shakes at home.  2.  Modify diet; liberalize to Regular.    NUTRITION DIAGNOSIS: Malnutrition related to poor appetite and intake as evidenced by physical exam.   Monitor:  1.  Food/Beverage; pt meeting >/=90% estimated needs with tolerance. 2.  Wt/wt change; monitor trends  Reason for Assessment: MST  47 y.o. female  Admitting Dx: Non-ST elevation myocardial infarction (NSTEMI), initial episode of care  ASSESSMENT: Pt admitted with MI and shortness of breath.  Pt states that she has had poor appetite and intake for the past several months.  Pt reports 20 lbs wt loss in 2 months.  She and friend at bedside report that she is not able to consume meals during the day, sometimes snacking, and recently started Ensure TID.   Discussed ways to maximize nutrition and encouraged intake once diet resumes.   Nutrition Focused Physical Exam:  Subcutaneous Fat:  Orbital Region: severe Upper Arm Region: wnl Thoracic and Lumbar Region: moderate  Muscle:  Temple Region: moderate Clavicle Bone Region: moderate Clavicle and Acromion Bone Region: moderate Scapular Bone Region: moderate Dorsal Hand: wnl Patellar Region: severe Anterior Thigh Region: moderate Posterior Calf Region: moderate  Edema: none present  Pt meets criteria for severe MALNUTRITION in the context of chronic as evidenced by >16% wt loss in 2 months and wasting identified on physical exam.   Height: Ht Readings from Last 1 Encounters:  05/02/13 5\' 3"  (1.6 m)    Weight: Wt Readings from Last 1 Encounters:  05/03/13 97 lb 10.6 oz (44.3 kg)    Ideal Body Weight: 115 lbs  % Ideal Body Weight:  84%  Wt Readings from Last 10 Encounters:  05/03/13 97 lb 10.6 oz (44.3 kg)  05/03/13 97 lb 10.6 oz (44.3 kg)    Usual Body Weight: 118 lbs per pt report  % Usual Body Weight: 84%  BMI:  Body mass index is 17.3 kg/(m^2).  Estimated Nutritional Needs: Kcal: 1450-1700 Protein: 50-65g Fluid: >1.5 L/day  Skin: intact  Diet Order: NPO  EDUCATION NEEDS: -Education needs addressed   Intake/Output Summary (Last 24 hours) at 05/04/13 1213 Last data filed at 05/03/13 2135  Gross per 24 hour  Intake    803 ml  Output    500 ml  Net    303 ml    Last BM: 7/27   Labs:   Recent Labs Lab 05/02/13 0005 05/03/13 0450  NA 138 141  K 3.4* 3.6  CL 95* 99  CO2 37* 36*  BUN 8 9  CREATININE 0.55 0.55  CALCIUM 8.9 8.9  MG 2.0  --   GLUCOSE 134* 128*    CBG (last 3)  No results found for this basename: GLUCAP,  in the last 72 hours  Scheduled Meds: . ALPRAZolam  1 mg Oral Daily  . aspirin EC  81 mg Oral Daily  . atorvastatin  80 mg Oral q1800  . azelastine  1 spray Each Nare QHS  . budesonide-formoterol  2 puff Inhalation BID  . diazepam  2 mg Oral On Call  . diltiazem  120 mg Oral Daily  . enoxaparin (LOVENOX) injection  45 mg Subcutaneous BID  . FLUoxetine  80 mg Oral Daily  .  fluticasone  1 spray Each Nare Daily  . gabapentin  600 mg Oral TID  . guaiFENesin  1,200 mg Oral BID  . levofloxacin  750 mg Oral Daily  . pantoprazole  40 mg Oral Daily  . predniSONE  60 mg Oral Q breakfast  . sodium chloride  3 mL Intravenous Q12H  . tiotropium  18 mcg Inhalation Daily  . traZODone  50 mg Oral QHS    Continuous Infusions: . sodium chloride      Past Medical History  Diagnosis Date  . COPD (chronic obstructive pulmonary disease)   . Shortness of breath   . Pneumonia   . Fibromyalgia   . Anxiety   . GERD (gastroesophageal reflux disease)   . Non-ST elevation myocardial infarction (NSTEMI), initial episode of care 05/04/2013    Past Surgical History   Procedure Laterality Date  . Cholecystectomy    . Abdominal hysterectomy    . C-secition      Loyce Dys, MS RD LDN Clinical Inpatient Dietitian Pager: [phone] Weekend/After hours pager: [phone]

## 2013-05-04 NOTE — Progress Notes (Signed)
Pt is refusing statin d/t her fibromyalgia.  Explained the reason she was on it, but still refuses.  Eliane Decree, RN

## 2013-05-04 NOTE — Care Management Note (Addendum)
    Page 1 of 1   05/05/2013     11:13:11 AM   CARE MANAGEMENT NOTE 05/05/2013  Patient:  Sarah Chan,Sarah Chan   Account Number:  [id number]  Date Initiated:  05/04/2013  Documentation initiated by:  Junius Creamer  Subjective/Objective Assessment:   adm w mi     Action/Plan:   lives w fam, chart states has home o2   Anticipated DC Date:  05/06/2013   Anticipated DC Plan:  HOME/SELF CARE      DC Planning Services  CM consult      Choice offered to / List presented to:             Status of service:   Medicare Important Message given?   (If response is "NO", the following Medicare IM given date fields will be blank) Date Medicare IM given:   Date Additional Medicare IM given:    Discharge Disposition:  HOME/SELF CARE  Per UR Regulation:  Reviewed for med. necessity/level of care/duration of stay  If discussed at Long Length of Stay Meetings, dates discussed:    Comments:  7/29 1111 debbie Rabia Argote rn,bsn spoke w pt. she lives in [place]. her pcp is a dr bridges. gave her 2 prescription discount cards. she uses local pharm for meds. she does not feel she needs hhc as has lots of fam and friends close.

## 2013-05-05 DIAGNOSIS — I5181 Takotsubo syndrome: Secondary | ICD-10-CM | POA: Diagnosis not present

## 2013-05-05 DIAGNOSIS — Z9981 Dependence on supplemental oxygen: Secondary | ICD-10-CM | POA: Diagnosis not present

## 2013-05-05 DIAGNOSIS — F17200 Nicotine dependence, unspecified, uncomplicated: Secondary | ICD-10-CM | POA: Diagnosis not present

## 2013-05-05 DIAGNOSIS — J441 Chronic obstructive pulmonary disease with (acute) exacerbation: Secondary | ICD-10-CM | POA: Diagnosis not present

## 2013-05-05 LAB — CBC
MCV: 91.2 fL (ref 78.0–100.0)
Platelets: 192 10*3/uL (ref 150–400)
RBC: 4.08 MIL/uL (ref 3.87–5.11)
WBC: 9.1 10*3/uL (ref 4.0–10.5)

## 2013-05-05 LAB — BASIC METABOLIC PANEL
CO2: 32 mEq/L (ref 19–32)
Chloride: 103 mEq/L (ref 96–112)
Creatinine, Ser: 0.58 mg/dL (ref 0.50–1.10)

## 2013-05-05 MED ORDER — POTASSIUM CHLORIDE CRYS ER 20 MEQ PO TBCR
40.0000 meq | EXTENDED_RELEASE_TABLET | Freq: Once | ORAL | Status: AC
  Administered 2013-05-05: 40 meq via ORAL
  Filled 2013-05-05: qty 2

## 2013-05-05 NOTE — Progress Notes (Signed)
   Subjective:  Feels better today.  No further chest pain. Cath showed Takotsubo.  Objective:  Vital Signs in the last 24 hours: Temp:  [97.6 F (36.4 C)-98.6 F (37 C)] 98 F (36.7 C) (07/29 0721) Pulse Rate:  [78-95] 82 (07/29 0721) Resp:  [13-20] 15 (07/29 0721) BP: (91-107)/(61-81) 100/68 mmHg (07/29 0721) SpO2:  [93 %-100 %] 98 % (07/29 0800)  Intake/Output from previous day: 07/28 0701 - 07/29 0700 In: 1171.3 [P.O.:240; I.V.:931.3] Out: 1800 [Urine:1800] Intake/Output from this shift:    . ALPRAZolam  1 mg Oral Daily  . aspirin EC  81 mg Oral Daily  . azelastine  1 spray Each Nare QHS  . budesonide-formoterol  2 puff Inhalation BID  . diltiazem  120 mg Oral Daily  . FLUoxetine  80 mg Oral Daily  . fluticasone  1 spray Each Nare Daily  . gabapentin  600 mg Oral TID  . guaiFENesin  1,200 mg Oral BID  . levofloxacin  750 mg Oral Daily  . pantoprazole  40 mg Oral Daily  . predniSONE  60 mg Oral Q breakfast  . sodium chloride  3 mL Intravenous Q12H  . tiotropium  18 mcg Inhalation Daily  . traZODone  50 mg Oral QHS   . sodium chloride      Physical Exam: The patient appears to be in no distress.  Head and neck exam reveals that the pupils are equal and reactive.  The extraocular movements are full.  There is no scleral icterus.  Mouth and pharynx are benign.  No lymphadenopathy.  No carotid bruits.  The jugular venous pressure is normal.  Thyroid is not enlarged or tender.  Chest mild wheezes.  Heart reveals no abnormal lift or heave.  First and second heart sounds are normal.  There is no murmur gallop rub or click.  The abdomen is soft and nontender.  Bowel sounds are normoactive.  There is no hepatosplenomegaly or mass.  There are no abdominal bruits. Groin okay.  Extremities reveal no phlebitis or edema.  Pedal pulses are good.  There is no cyanosis or clubbing.  Neurologic exam is normal strength and no lateralizing weakness.  No sensory  deficits.  Integument reveals no rash  Lab Results:  Recent Labs  05/04/13 0435 05/05/13 0458  WBC 11.9* 9.1  HGB 13.0 11.3*  PLT 232 192    Recent Labs  05/03/13 0450 05/05/13 0458  NA 141 142  K 3.6 3.0*  CL 99 103  CO2 36* 32  GLUCOSE 128* 96  BUN 9 12  CREATININE 0.55 0.58    Recent Labs  05/03/13 0450 05/03/13 1040  TROPONINI 2.32* 1.45*   Hepatic Function Panel No results found for this basename: PROT, ALBUMIN, AST, ALT, ALKPHOS, BILITOT, BILIDIR, IBILI,  in the last 72 hours  Recent Labs  05/03/13 0450  CHOL 164   No results found for this basename: PROTIME,  in the last 72 hours  Imaging: Imaging results have been reviewed  Cardiac Studies: Telemetry NSR. EKG NSR with marked anterior T wave changes. Assessment/Plan:  1. Takotsubo stress cardiomyopathy. 2  Oxygen dependent COPD  3. GERD  Plan: Patient refuses lipitor because of her fibromyalgia. Fortunately she has normal coronaries and her lipid levels are normal. BP still soft so no ARB yet. Ambulate today, prob home in am  LOS: 3 days    Cassell Clement 05/05/2013, 8:09 AM

## 2013-05-05 NOTE — Progress Notes (Signed)
CARDIAC REHAB PHASE I   PRE:  Rate/Rhythm: 100SR  BP:  Supine: 115/68  Sitting:   Standing:    SaO2: 97%2L  MODE:  Ambulation: 170 ft   POST:  Rate/Rhythm: 119ST  BP:  Supine:  Sitting: 118/73  Standing:    SaO2: 94%2L 0955-1108 Pt walked 170 ft on 2L with rolling walker and asst x 1 with slow pace. Stopped twice to rest due to weakness not CP. Pt very weak. To recliner after walk. Walks on 2L oxygen at home but does not walk long distances. Encouraged pt to walk as tolerated. MI ed done with focus on takot subo cardiomyopathy. Gave pt CHF booklet and encouraged daily weights and 2000 mg sodium restriction. Discussed smoking cessation and handouts given . Pt states going to quit cold [place]. Pt has been under lots of stress since leaving husband in May and being concerned about her 20 yr old son. Encouraged pt to walk with nurse later to help with weakness. Pt has done a little of pulmonary rehab in [place] before and would like to try to get her life more in order before deciding on cardiac rehab. She will talk with cardiologist at office visit if she decides to do program.   Sarah Nutting, RN BSN  05/05/2013 11:02 AM

## 2013-05-06 ENCOUNTER — Other Ambulatory Visit: Payer: Self-pay | Admitting: Physician Assistant

## 2013-05-06 ENCOUNTER — Encounter (HOSPITAL_COMMUNITY): Payer: Self-pay | Admitting: Physician Assistant

## 2013-05-06 ENCOUNTER — Telehealth: Payer: Self-pay | Admitting: Physician Assistant

## 2013-05-06 DIAGNOSIS — I5181 Takotsubo syndrome: Secondary | ICD-10-CM

## 2013-05-06 DIAGNOSIS — E876 Hypokalemia: Secondary | ICD-10-CM

## 2013-05-06 DIAGNOSIS — I214 Non-ST elevation (NSTEMI) myocardial infarction: Secondary | ICD-10-CM

## 2013-05-06 LAB — BASIC METABOLIC PANEL
BUN: 10 mg/dL (ref 6–23)
CO2: 32 mEq/L (ref 19–32)
Chloride: 101 mEq/L (ref 96–112)
GFR calc Af Amer: >90 mL/min (ref >90)
Glucose, Bld: 72 mg/dL (ref 70–99)
Potassium: 3.1 mEq/L — ABNORMAL LOW (ref 3.5–5.1)

## 2013-05-06 MED ORDER — LISINOPRIL 2.5 MG PO TABS
2.5000 mg | ORAL_TABLET | Freq: Every day | ORAL | Status: DC
  Administered 2013-05-06: 2.5 mg via ORAL
  Filled 2013-05-06: qty 1

## 2013-05-06 MED ORDER — LISINOPRIL 2.5 MG PO TABS: 2.5000 mg | ORAL_TABLET | Freq: Every day | ORAL | Status: DC

## 2013-05-06 MED ORDER — POTASSIUM CHLORIDE CRYS ER 20 MEQ PO TBCR
40.0000 meq | EXTENDED_RELEASE_TABLET | Freq: Once | ORAL | Status: AC
  Administered 2013-05-06: 40 meq via ORAL

## 2013-05-06 MED ORDER — ENSURE COMPLETE PO LIQD
237.0000 mL | Freq: Two times a day (BID) | ORAL | Status: DC
Start: 2013-05-06 — End: 2013-05-06

## 2013-05-06 MED ORDER — DILTIAZEM HCL ER COATED BEADS 120 MG PO CP24: 120.0000 mg | ORAL_CAPSULE | Freq: Every day | ORAL | Status: DC

## 2013-05-06 MED ORDER — POTASSIUM CHLORIDE ER 20 MEQ PO TBCR: 20.0000 meq | EXTENDED_RELEASE_TABLET | Freq: Every day | ORAL | Status: DC

## 2013-05-06 NOTE — Telephone Encounter (Signed)
TCM -patient d/c [facility] 7-30

## 2013-05-06 NOTE — Progress Notes (Signed)
Subjective:  Feels better today.  No further chest pain. Tolerated ambulation yesterday. Cath showed Takotsubo.  Objective:  Vital Signs in the last 24 hours: Temp:  [97.9 F (36.6 C)-98.9 F (37.2 C)] 98 F (36.7 C) (07/30 0715) Pulse Rate:  [83-101] 83 (07/30 0715) Resp:  [16-26] 19 (07/30 0715) BP: (93-156)/(66-87) 124/79 mmHg (07/30 0715) SpO2:  [96 %-98 %] 98 % (07/30 0715)  Intake/Output from previous day: 07/29 0701 - 07/30 0700 In: 1406 [P.O.:1400; I.V.:6] Out: 1775 [Urine:1775] Intake/Output from this shift:    . ALPRAZolam  1 mg Oral Daily  . aspirin EC  81 mg Oral Daily  . azelastine  1 spray Each Nare QHS  . budesonide-formoterol  2 puff Inhalation BID  . diltiazem  120 mg Oral Daily  . FLUoxetine  80 mg Oral Daily  . fluticasone  1 spray Each Nare Daily  . gabapentin  600 mg Oral TID  . guaiFENesin  1,200 mg Oral BID  . levofloxacin  750 mg Oral Daily  . lisinopril  2.5 mg Oral Daily  . pantoprazole  40 mg Oral Daily  . predniSONE  60 mg Oral Q breakfast  . sodium chloride  3 mL Intravenous Q12H  . tiotropium  18 mcg Inhalation Daily  . traZODone  50 mg Oral QHS   . sodium chloride      Physical Exam: The patient appears to be in no distress.  Head and neck exam reveals that the pupils are equal and reactive.  The extraocular movements are full.  There is no scleral icterus.  Mouth and pharynx are benign.  No lymphadenopathy.  No carotid bruits.  The jugular venous pressure is normal.  Thyroid is not enlarged or tender.  Chest mild wheezes.  Heart reveals no abnormal lift or heave.  First and second heart sounds are normal.  There is no murmur gallop rub or click.  The abdomen is soft and nontender.  Bowel sounds are normoactive.  There is no hepatosplenomegaly or mass.  There are no abdominal bruits. Groin okay.  Extremities reveal no phlebitis or edema.  Pedal pulses are good.  There is no cyanosis or clubbing.  Neurologic exam is normal  strength and no lateralizing weakness.  No sensory deficits.  Integument reveals no rash  Lab Results:  Recent Labs  05/04/13 0435 05/05/13 0458  WBC 11.9* 9.1  HGB 13.0 11.3*  PLT 232 192    Recent Labs  05/05/13 0458  NA 142  K 3.0*  CL 103  CO2 32  GLUCOSE 96  BUN 12  CREATININE 0.58    Recent Labs  05/03/13 1040  TROPONINI 1.45*   Hepatic Function Panel No results found for this basename: PROT, ALBUMIN, AST, ALT, ALKPHOS, BILITOT, BILIDIR, IBILI,  in the last 72 hours No results found for this basename: CHOL,  in the last 72 hours No results found for this basename: PROTIME,  in the last 72 hours  Imaging: Imaging results have been reviewed  Cardiac Studies: Telemetry NSR. EKG NSR with marked anterior T wave changes. Assessment/Plan:  1. Takotsubo stress cardiomyopathy. 2  Oxygen dependent COPD  3. GERD  Plan: Patient refuses lipitor because of her fibromyalgia. Fortunately she has normal coronaries and her lipid levels are normal. BP normal now so able to start low lose lisinopril. K+ low yesterday, repeating this am. Okay for discharge today. Medical and pulmonary followup in [place].  She would like to be followed in [facility] for cardiology.  LOS: 4 days    Cassell Clement 05/06/2013, 8:17 AM

## 2013-05-06 NOTE — Discharge Summary (Signed)
Discharge Summary   Patient ID: Sarah Chan MRN: [id number], DOB/AGE: 1966-07-03 47 y.o. Admit date: 05/02/2013 D/C date:     05/06/2013  Primary Cardiologist: [facility], pt prefers to f/u with [facility] in [facility]  Primary Discharge Diagnoses:  1. Takotsubo stress cardiomyopathy - EF 30% by cath with normal coronary arteries this admission 2. Oxygen-dependent COPD 3. GERD 4. Hypokalemia  Secondary Discharge Diagnoses:  1. Fibromyalgia 2. Back Pain  3. Anxiety  4. Neck pain  5. Irritable bowel syndrome  6. Depression  Hospital Course: Sarah Chan is a 47 y/o F with history of oxygen-dependent COPD, intermittent tobacco abuse, GERD who presented 05/02/2013 as a transfer from [facility] to [facility] with chest pain and SOB. She has a baseline of chronic dyspnea on exertion after walking about 50-70 feet. Over the last one week, she complained of worsening of her baseline dyspnea on exertion that seems to occur when she started smoking again. She was seen by outside physician who started her on inhalers, antibiotics and prednisone taper. However, four days prior to admission she started to complain of rest chest pain described as a tightness, 9/10, typically lasting for about a few minutes before spontaneous relief. Her chest pain was non-radiating, associated with worsening SOB and diaphoresis. She denied nausea, vomiting, palpitation or syncope. As part of her evaluation at [facility], she had EKG that showed sinus tachycardia with no ST elevation but elevated troponin of 1.5. D-dimer was WNL. She was subsequently transferred to [facility] for consideration of cardiac cath. She was continued on her treatment of antibiotics, prednisone, inhalers, and PRN nebs for COPD exacerbation. Troponin here peaked at 2.32. She was placed on ASA, Lipitor and Lovenox. 2D Echo showed EF 25-30% with severe hypokinesis of the mid-distal anteroseptal and apical myocardium with  grade 1 diastolic dysfunction, PA pressure raising question of Takotsubo CM. She underwent cath 05/04/13 showing normal coronary arteries and severe LV dysfunction EF 30% anterior, periapical, and inferior hypokinesis and hyperdynamic basal segments, consistent with Takotsubo's stress cardiomyopathy. The patient did endorse going through a very stressful time with separation from her spouse. She declined social work consult. The patient refused statin due to fibromyalgia, which was acceptable given normal coronary arteries and normal lipid levels. Dr. Patty Chan added Diltiazem for improved HR control & anti-spasm quality as she was not a candidate for BB given her COPD/wheezing. He feels CCB is ok to use in this setting because he expects her LV function will improve. He does not feel the patient requires daily ASA. BP remained soft after addition of dilitazem, thus only low dose ACEI was added. Activity was progressed. Today she is feeling better. She has completed a full course of antibiotics as previously prescribed including doses this admission. She is to pick up her prednisone taper where she left off at home (was getting 60mg  here, will pick up at 40mg  at home). Dr. Patty Chan has seen and examined the patient today and feels she is stable for discharge. She will require repeat echo at some point at discretion of primary cardiologist to reassess EF.  Potassium level was low periodically this admission and was supplemented - she will go home on KCl daily. Hopefully this, along with initiation of ACEI, will keep potassium at a normal level. Plan to repeat BMET at [facility] in 1 week - lab slip given to patient. She will also have f/u ECG at her office visit.  Discharge Vitals: Blood pressure 124/79,  pulse 85, temperature 98 F (36.7 C), temperature source Oral, resp. rate 19, height 5\' 3"  (1.6 m), weight 97 lb 10.6 oz (44.3 kg), SpO2 97.00%.  Labs: Lab Results  Component Value Date   WBC 9.1  05/05/2013   HGB 11.3* 05/05/2013   HCT 37.2 05/05/2013   MCV 91.2 05/05/2013   PLT 192 05/05/2013    Recent Labs Lab 05/02/13 0005  05/06/13 0913  NA 138  < > 140  K 3.4*  < > 3.1*  CL 95*  < > 101  CO2 37*  < > 32  BUN 8  < > 10  CREATININE 0.55  < > 0.65  CALCIUM 8.9  < > 9.1  PROT 6.8  --   --   BILITOT 0.3  --   --   ALKPHOS 67  --   --   ALT 18  --   --   AST 27  --   --   GLUCOSE 134*  < > 72  < > = values in this interval not displayed. No results found for this basename: CKTOTAL, CKMB, TROPONINI,  in the last 72 hours Lab Results  Component Value Date   CHOL 164 05/03/2013   HDL 80 05/03/2013   LDLCALC 69 05/03/2013   TRIG 77 05/03/2013    Diagnostic Studies/Procedures   Dg Chest Port 1 View 05/03/2013   *RADIOLOGY REPORT*  Clinical Data: COPD.  PORTABLE CHEST - 1 VIEW  Comparison: 05/02/2013  Findings: The lungs are markedly hyperinflated consistent with emphysema.  There is peribronchial thickening which is unchanged. No infiltrates or effusions.  Heart size and vascularity are normal.  No acute osseous abnormality.  IMPRESSION: Hyperinflated lungs consistent with COPD.  No acute abnormality.   Original Report Authenticated By: Francene Boyers, M.D.   Cardiac catheterization this admission, please see full report and above for summary.  2D Echo 05/03/13 - Left ventricle: The cavity size was normal. Wall thickness was increased in a pattern of mild LVH. Systolic function was severely reduced. The estimated ejection fraction was in the range of 25% to 30%. There is severe hypokinesis of the mid-distalanteroseptal and apical myocardium. Doppler parameters are consistent with abnormal left ventricular relaxation (grade 1 diastolic dysfunction). - Pulmonary arteries: PA peak pressure: 46mm Hg (S).   Discharge Medications     Medication List    STOP taking these medications       moxifloxacin 400 MG tablet  Commonly known as:  AVELOX      TAKE these medications        albuterol (2.5 MG/3ML) 0.083% nebulizer solution  Commonly known as:  PROVENTIL  Take 2.5 mg by nebulization every 6 (six) hours as needed for wheezing or shortness of breath.     albuterol 108 (90 BASE) MCG/ACT inhaler  Commonly known as:  PROVENTIL HFA;VENTOLIN HFA  Inhale 2 puffs into the lungs every 6 (six) hours as needed for wheezing or shortness of breath.     ALPRAZolam 0.5 MG tablet  Commonly known as:  XANAX  Take 0.5 mg by mouth 3 (three) times daily as needed for sleep.     Azelastine HCl 0.15 % Soln  Place 0.15 application into the nose at bedtime.     budesonide-formoterol 160-4.5 MCG/ACT inhaler  Commonly known as:  SYMBICORT  Inhale 2 puffs into the lungs 2 (two) times daily.     cyanocobalamin 1000 MCG/ML injection  Commonly known as:  (VITAMIN B-12)  Inject 1,000 mcg into the  muscle every 30 (thirty) days.     dexlansoprazole 60 MG capsule  Commonly known as:  DEXILANT  Take 60 mg by mouth daily.     diltiazem 120 MG 24 hr capsule  Commonly known as:  CARDIZEM CD  Take 1 capsule (120 mg total) by mouth daily.     FLUoxetine 40 MG capsule  Commonly known as:  PROZAC  Take 80 mg by mouth daily.     gabapentin 600 MG tablet  Commonly known as:  NEURONTIN  Take 600 mg by mouth 3 (three) times daily.     HYDROcodone-acetaminophen 7.5-325 MG per tablet  Commonly known as:  NORCO  Take 1 tablet by mouth every 6 (six) hours as needed for pain.     ibandronate 150 MG tablet  Commonly known as:  BONIVA  Take 150 mg by mouth every 30 (thirty) days. Take in the morning with a full glass of water, on an empty stomach, and do not take anything else by mouth or lie down for the next 30 min.     lisinopril 2.5 MG tablet  Commonly known as:  PRINIVIL,ZESTRIL  Take 1 tablet (2.5 mg total) by mouth daily.     Potassium Chloride ER 20 MEQ Tbcr  Take 20 mEq by mouth daily.     predniSONE 20 MG tablet  Commonly known as:  DELTASONE  - Take 10-40 mg by mouth  daily. Take 2 tablets daily x7 days  - Take 1 tablet daily x7 days  - Take 1/2 tablet daily x7 days     tiotropium 18 MCG inhalation capsule  Commonly known as:  SPIRIVA  Place 18 mcg into inhaler and inhale daily.     traZODone 50 MG tablet  Commonly known as:  DESYREL  Take 50 mg by mouth at bedtime.        Disposition   The patient will be discharged in stable condition to home. Discharge Orders   Future Appointments Provider Department Dept Phone   05/14/2013 3:00 PM Prescott Parma, PA-C [facility] [place] [phone]   Future Orders Complete By Expires     Diet - low sodium heart healthy  As directed     Discharge instructions  As directed     Comments:      Check the date on your antibiotic bottle to make sure you completed 7 days of medication - the days in the hospital count towards this. Pick up steroid taper where you left off prior to admission.  One of your heart tests showed weakness of the heart muscle this admission. This may make you more susceptible to weight gain from fluid retention, which can lead to symptoms that we call heart failure. For patients with this condition, we give them these special instructions:  1. Follow a low-salt diet and watch your fluid intake. In general, you should not be taking in more than 2 liters of fluid per day (no more than 8 glasses per day). Some patients are restricted to less than 1.5 liters of fluid per day (no more than 6 glasses per day). This includes sources of water in foods like soup, coffee, tea, milk, etc. 2. Weigh yourself on the same scale at same time of day and keep a log. 3. Call your doctor: (Anytime you feel any of the following symptoms)  - 3-4 pound weight gain in 1-2 days or 2 pounds overnight  - Shortness of breath, with or without a dry hacking cough  -  Swelling in the hands, feet or stomach  - If you have to sleep on extra pillows at night in order to breathe  IT IS IMPORTANT TO LET YOUR  DOCTOR KNOW EARLY ON IF YOU ARE HAVING SYMPTOMS SO WE CAN HELP YOU!    Increase activity slowly  As directed     Comments:      No driving for 1 week. No lifting over 10 lbs for 2 weeks. No sexual activity for 2 weeks. Keep procedure site clean & dry. If you notice increased pain, swelling, bleeding or pus, call/return!  You may shower, but no soaking baths/hot tubs/pools for 1 week.      Follow-up Information   Follow up with SERPE, EUGENE, PA-C. (05/14/13 at 3pm)    Contact information:   [facility] [place] [address] [place], [address] [place] [address] [phone]       Follow up with [facility] at [facility]. (Please have labwork done at [facility] either the day before or day of your appointment with Mr. Shara Blazing as above - bring lab slip.)       Follow up with Primary Care Doctor/Pulmonary Doctor.        Duration of Discharge Encounter: Greater than 30 minutes including physician and PA time.  Signed, Ronie Spies PA-C 05/06/2013, 10:56 AM

## 2013-05-06 NOTE — Progress Notes (Signed)
DC instructions are completed. Pt is being discharged to home with a friend.

## 2013-05-07 NOTE — Telephone Encounter (Signed)
Patient contacted regarding discharge from [facility] on 05/06/2013.   Patient understands to follow up with provider Rozell Searing, PA on Thursday, May 14, 2013 at 3:00 at [facility] office.    Patient understands discharge instructions? yes  Patient understands medications and regiment? Yes, Did have question regarding whether or not she is to be on Aspirin 81mg  daily.  Informed patient that per d/c summary - no ASA indicated at this time.    Patient understands to bring all medications to this visit? yes    Patient questioned if we could write her an order for a shower chair.  Informed her that we typically do not write for these things, usually comes from PMD.  Patient stated that she did have one in the past, but having other issues and can not locate now.  Also, stated that she could not afford to purchase another chair & needed order so that her insurance will cover.  Informed pt that message will be sent to provider for approval or not.  Patient verbalized understanding.

## 2013-05-08 NOTE — Telephone Encounter (Signed)
Yes, she may have prescription forshower chair

## 2013-05-11 NOTE — Telephone Encounter (Signed)
Patient notified.  She will call back with name of pharmacy she would like it to go to.  She is going to call to make sure they have one in stock.

## 2013-05-11 NOTE — Telephone Encounter (Signed)
Patient returned call - stated she would like to get this from [facility].  Needed order to be a 3 prong so her insurance will cover this.  Asker her to have [facility] fax [place] an order for signing as we typically do not write for this.  This will also ensure that the correct item is ordered.  Patient verbalized understanding.

## 2013-05-12 MED ORDER — COMMODE BEDSIDE MISC: Status: DC

## 2013-05-12 NOTE — Addendum Note (Signed)
Addended by: Lesle Chris on: 05/12/2013 03:11 PM   Modules accepted: Orders

## 2013-05-12 NOTE — Telephone Encounter (Signed)
[place] [facility] [place], [place] [phone]) contacted.  Order written & faxed [phone]) for 3 in 1 shower chair / bedside commode.

## 2013-05-13 ENCOUNTER — Other Ambulatory Visit: Payer: Self-pay | Admitting: *Deleted

## 2013-05-13 DIAGNOSIS — E876 Hypokalemia: Secondary | ICD-10-CM

## 2013-05-14 ENCOUNTER — Encounter: Payer: Self-pay | Admitting: Physician Assistant

## 2013-05-14 ENCOUNTER — Ambulatory Visit (INDEPENDENT_AMBULATORY_CARE_PROVIDER_SITE_OTHER): Payer: BC Managed Care – PPO | Admitting: Physician Assistant

## 2013-05-14 VITALS — BP 89/58 | HR 98 | Ht 63.0 in | Wt 99.4 lb

## 2013-05-14 DIAGNOSIS — E876 Hypokalemia: Secondary | ICD-10-CM

## 2013-05-14 DIAGNOSIS — I5181 Takotsubo syndrome: Secondary | ICD-10-CM

## 2013-05-14 DIAGNOSIS — J449 Chronic obstructive pulmonary disease, unspecified: Secondary | ICD-10-CM

## 2013-05-14 DIAGNOSIS — I252 Old myocardial infarction: Secondary | ICD-10-CM

## 2013-05-14 DIAGNOSIS — I429 Cardiomyopathy, unspecified: Secondary | ICD-10-CM

## 2013-05-14 MED ORDER — DILTIAZEM HCL 30 MG PO TABS: 30.0000 mg | ORAL_TABLET | Freq: Three times a day (TID) | ORAL | Status: DC

## 2013-05-14 NOTE — Progress Notes (Signed)
Primary Cardiologist: Prentice Docker, MD (new)   HPI: Post hospital followup from [facility], discharged July 30, following direct transfer from [facility] ED for further evaluation of CP with abnormal troponins. She was subsequently diagnosed with Tako-tsubo cardiomyopathy.   - Catheterization, July 28: NL coronary arteries; EF 30%, with hyperdynamic basal segments   - Echocardiogram, July 27: EF 25-30%  The patient was deemed not a suitable candidate for beta blocker therapy, secondary to COPD/wheezing. Consequently, she was placed on Cardizem for both improved HR control, as well as antispasm benefit. Use of calcium channel blocker therapy was also felt to be appropriate, given that patient's LVF was expected to improve. She was also started on low-dose ACE inhibitor therapy.   - Followup laboratory data, August 6: BUN 9, creatinine 0.6, potassium 4.9 (3.1)  Clinically, she has not had any further CP. She denies symptoms suggestive of congestive heart failure. She admits to being under considerable stress, due to ongoing separation proceedings with her husband.   Twelve-lead EKG today, reviewed by me, indicates NSR 86 bpm; symmetric T wave inversion in anterolateral leads  Allergies  Allergen Reactions  . Doxycycline Nausea Only    Current Outpatient Prescriptions  Medication Sig Dispense Refill  . albuterol (PROVENTIL HFA;VENTOLIN HFA) 108 (90 BASE) MCG/ACT inhaler Inhale 2 puffs into the lungs every 6 (six) hours as needed for wheezing or shortness of breath.      Marland Kitchen albuterol (PROVENTIL) (2.5 MG/3ML) 0.083% nebulizer solution Take 2.5 mg by nebulization every 6 (six) hours as needed for wheezing or shortness of breath.      . ALPRAZolam (XANAX) 0.5 MG tablet Take 0.5 mg by mouth 3 (three) times daily as needed for sleep.      . Azelastine HCl 0.15 % SOLN Place 0.15 application into the nose at bedtime.      Marland Kitchen azithromycin (ZITHROMAX) 250 MG tablet Take 250 mg by mouth. One tab on Monday,  Wednesday, Friday      . budesonide-formoterol (SYMBICORT) 160-4.5 MCG/ACT inhaler Inhale 2 puffs into the lungs 2 (two) times daily.      . cyanocobalamin (,VITAMIN B-12,) 1000 MCG/ML injection Inject 1,000 mcg into the muscle every 30 (thirty) days.      Marland Kitchen dexlansoprazole (DEXILANT) 60 MG capsule Take 60 mg by mouth daily.      Marland Kitchen diltiazem (CARDIZEM CD) 120 MG 24 hr capsule Take 1 capsule (120 mg total) by mouth daily.  30 capsule  2  . FLUoxetine (PROZAC) 40 MG capsule Take 80 mg by mouth daily.      Marland Kitchen gabapentin (NEURONTIN) 600 MG tablet Take 600 mg by mouth 3 (three) times daily.      Marland Kitchen HYDROcodone-acetaminophen (NORCO) 7.5-325 MG per tablet Take 1 tablet by mouth every 6 (six) hours as needed for pain.      Marland Kitchen ibandronate (BONIVA) 150 MG tablet Take 150 mg by mouth every 30 (thirty) days. Take in the morning with a full glass of water, on an empty stomach, and do not take anything else by mouth or lie down for the next 30 min.      Marland Kitchen lisinopril (PRINIVIL,ZESTRIL) 2.5 MG tablet Take 1 tablet (2.5 mg total) by mouth daily.  30 tablet  2  . Milnacipran (SAVELLA) 50 MG TABS tablet Take 50 mg by mouth 2 (two) times daily.      . potassium chloride 20 MEQ TBCR Take 20 mEq by mouth daily.  30 tablet  2  . predniSONE (DELTASONE) 20 MG  tablet Take 10-40 mg by mouth daily. Take 2 tablets daily x7 days Take 1 tablet daily x7 days Take 1/2 tablet daily x7 days      . roflumilast (DALIRESP) 500 MCG TABS tablet Take 500 mcg by mouth daily.      Marland Kitchen tiotropium (SPIRIVA) 18 MCG inhalation capsule Place 18 mcg into inhaler and inhale daily.      . traZODone (DESYREL) 50 MG tablet Take 50 mg by mouth at bedtime.       No current facility-administered medications for this visit.    Past Medical History  Diagnosis Date  . COPD (chronic obstructive pulmonary disease)     Oxygen dependent  . Pneumonia   . Fibromyalgia   . Anxiety   . GERD (gastroesophageal reflux disease)   . Takotsubo cardiomyopathy      a. NSTEMI 04/2013 secondary to Takotsubo's CM - normal cors, EF 30%.  . Depression   . IBS (irritable bowel syndrome)   . Back pain   . Neck pain     Past Surgical History  Procedure Laterality Date  . Cholecystectomy    . Abdominal hysterectomy    . C-secition      History   Social History  . Marital Status: Unknown    Spouse Name: N/A    Number of Children: N/A  . Years of Education: N/A   Occupational History  . disabled    Social History Main Topics  . Smoking status: Former Smoker -- 0.50 packs/day for 20 years    Types: Cigarettes    Quit date: 04/19/2013  . Smokeless tobacco: Not on file  . Alcohol Use: No  . Drug Use: No  . Sexually Active: Not Currently   Other Topics Concern  . Not on file   Social History Narrative  . No narrative on file    No family history on file.  ROS: no nausea, vomiting; no fever, chills; no melena, hematochezia; no claudication  PHYSICAL EXAM: BP 89/58  Pulse 98  Ht 5\' 3"  (1.6 m)  Wt 99 lb 6.4 oz (45.088 kg)  BMI 17.61 kg/m2  SpO2 98% GENERAL: 47 year old female; NAD HEENT: NCAT, PERRLA, EOMI; sclera clear; no xanthelasma NECK: palpable bilateral carotid pulses, no bruits; no JVD; no TM LUNGS: CTA bilaterally CARDIAC: RRR (S1, S2); no significant murmurs; no rubs or gallops ABDOMEN: soft, non-tender; intact BS EXTREMETIES: Small palpable right groin knot, or incisional ecchymosis, no femoral bruit intact distal pulses; no peripheral edema SKIN: warm/dry; no obvious rash/lesions MUSCULOSKELETAL: no joint deformity NEURO: no focal deficit; NL affect   EKG: reviewed and available in Electronic Records   ASSESSMENT & PLAN:  Takotsubo cardiomyopathy Will order a followup echocardiogram in approximately 2 months, prior to next OV, for reassessment of LVF, which is expected to fully recover. Until then, recommend continued therapy with diltiazem and ACE inhibitor, for reasons as outlined above. However, in light of  current relative hypotension, will discontinue long-acting Cardizem and start diltiazem 30 mg 3 times a day. Lisinopril to be continued at current dose. Following normalization of LVF, the patient may be taken off both medications, the latter given that she has no known history of HTN.  Hypokalemia Normalized, by followup metabolic profile. Patient is not on a diuretic, and supplemental potassium will be discontinued.  COPD (chronic obstructive pulmonary disease) Oxygen dependent    Gene Jasan Doughtie, PAC

## 2013-05-14 NOTE — Assessment & Plan Note (Signed)
Oxygen dependent.  

## 2013-05-14 NOTE — Patient Instructions (Addendum)
   Echo - due in 2 months just prior to next office visit  Stop K-Dur (potassium)  Stop Cardizem  Begin Diltiazem 30mg  three times per day - new sent to pharm  Continue all other medications.   Follow up in  2 months

## 2013-05-14 NOTE — Assessment & Plan Note (Signed)
Normalized, by followup metabolic profile. Patient is not on a diuretic, and supplemental potassium will be discontinued.

## 2013-05-14 NOTE — Assessment & Plan Note (Addendum)
Will order a followup echocardiogram in approximately 2 months, prior to next OV, for reassessment of LVF, which is expected to fully recover. Until then, recommend continued therapy with diltiazem and ACE inhibitor, for reasons as outlined above. However, in light of current relative hypotension, will discontinue long-acting Cardizem and start diltiazem 30 mg 3 times a day. Lisinopril to be continued at current dose. Following normalization of LVF, the patient may be taken off both medications, the latter given that she has no known history of HTN.

## 2013-06-17 ENCOUNTER — Encounter: Payer: Self-pay | Admitting: Cardiovascular Disease

## 2013-06-17 LAB — PULMONARY FUNCTION TEST

## 2013-07-22 ENCOUNTER — Other Ambulatory Visit (INDEPENDENT_AMBULATORY_CARE_PROVIDER_SITE_OTHER): Payer: BC Managed Care – PPO

## 2013-07-22 ENCOUNTER — Other Ambulatory Visit: Payer: Self-pay

## 2013-07-22 DIAGNOSIS — I429 Cardiomyopathy, unspecified: Secondary | ICD-10-CM

## 2013-07-22 DIAGNOSIS — I5181 Takotsubo syndrome: Secondary | ICD-10-CM

## 2013-07-22 DIAGNOSIS — I252 Old myocardial infarction: Secondary | ICD-10-CM

## 2013-07-22 DIAGNOSIS — I059 Rheumatic mitral valve disease, unspecified: Secondary | ICD-10-CM

## 2013-07-24 ENCOUNTER — Ambulatory Visit (INDEPENDENT_AMBULATORY_CARE_PROVIDER_SITE_OTHER): Payer: BC Managed Care – PPO | Admitting: Cardiovascular Disease

## 2013-07-24 ENCOUNTER — Encounter: Payer: Self-pay | Admitting: Cardiovascular Disease

## 2013-07-24 VITALS — BP 115/81 | HR 91 | Ht 63.0 in | Wt 100.4 lb

## 2013-07-24 DIAGNOSIS — J449 Chronic obstructive pulmonary disease, unspecified: Secondary | ICD-10-CM

## 2013-07-24 DIAGNOSIS — I5181 Takotsubo syndrome: Secondary | ICD-10-CM

## 2013-07-24 DIAGNOSIS — I252 Old myocardial infarction: Secondary | ICD-10-CM

## 2013-07-24 NOTE — Progress Notes (Signed)
Patient ID: Sarah Chan, female   DOB: [birth year], 47 y.o.   MRN: [id number]      SUBJECTIVE: Sarah Chan is here to f/u on her Takotsubo cardiomyopathy. Her LV function has now normalized. She has not taken either diltiazem or lisinopril for the past 2 weeks. She denies worsening shortness of breath from her baseline due to COPD. She also denies leg swelling. She said her chest "felt funny" this morning, but also says she's going through a custody battle over her 35 yr old son with her husband whom she's separated from. She continues to wear oxygen for her COPD. Cath in July revealed normal coronaries.    Allergies  Allergen Reactions  . Doxycycline Nausea Only    Current Outpatient Prescriptions  Medication Sig Dispense Refill  . albuterol (PROVENTIL HFA;VENTOLIN HFA) 108 (90 BASE) MCG/ACT inhaler Inhale 2 puffs into the lungs every 6 (six) hours as needed for wheezing or shortness of breath.      Marland Kitchen albuterol (PROVENTIL) (2.5 MG/3ML) 0.083% nebulizer solution Take 2.5 mg by nebulization every 6 (six) hours as needed for wheezing or shortness of breath.      . ALPRAZolam (XANAX) 0.5 MG tablet Take 0.5 mg by mouth 3 (three) times daily as needed for sleep.      . Azelastine HCl 0.15 % SOLN Place 0.15 application into the nose at bedtime.      . budesonide-formoterol (SYMBICORT) 160-4.5 MCG/ACT inhaler Inhale 2 puffs into the lungs 2 (two) times daily.      . cyanocobalamin (,VITAMIN B-12,) 1000 MCG/ML injection Inject 1,000 mcg into the muscle every 30 (thirty) days.      Marland Kitchen dexlansoprazole (DEXILANT) 60 MG capsule Take 60 mg by mouth daily.      Marland Kitchen FLUoxetine (PROZAC) 40 MG capsule Take 80 mg by mouth daily.      Marland Kitchen gabapentin (NEURONTIN) 600 MG tablet Take 600 mg by mouth 3 (three) times daily.      Marland Kitchen HYDROcodone-acetaminophen (NORCO) 7.5-325 MG per tablet Take 1 tablet by mouth every 6 (six) hours as needed for pain.      Marland Kitchen ibandronate (BONIVA) 150 MG tablet Take 150 mg by mouth  every 30 (thirty) days. Take in the morning with a full glass of water, on an empty stomach, and do not take anything else by mouth or lie down for the next 30 min.      . megestrol (MEGACE) 40 MG tablet Take 40 mg by mouth 2 (two) times daily.      . Milnacipran (SAVELLA) 50 MG TABS tablet Take 50 mg by mouth 2 (two) times daily.      . roflumilast (DALIRESP) 500 MCG TABS tablet Take 500 mcg by mouth daily.      Marland Kitchen tiotropium (SPIRIVA) 18 MCG inhalation capsule Place 18 mcg into inhaler and inhale daily.      . traZODone (DESYREL) 50 MG tablet Take 50 mg by mouth at bedtime.       No current facility-administered medications for this visit.    Past Medical History  Diagnosis Date  . COPD (chronic obstructive pulmonary disease)     Oxygen dependent  . Pneumonia   . Fibromyalgia   . Anxiety   . GERD (gastroesophageal reflux disease)   . Takotsubo cardiomyopathy     a. NSTEMI 04/2013 secondary to Takotsubo's CM - normal cors, EF 30%.  . Depression   . IBS (irritable bowel syndrome)   . Back pain   . Neck pain  Past Surgical History  Procedure Laterality Date  . Cholecystectomy    . Abdominal hysterectomy    . C-secition      History   Social History  . Marital Status: Unknown    Spouse Name: N/A    Number of Children: N/A  . Years of Education: N/A   Occupational History  . disabled    Social History Main Topics  . Smoking status: Former Smoker -- 0.50 packs/day for 20 years    Types: Cigarettes    Quit date: 04/19/2013  . Smokeless tobacco: Not on file  . Alcohol Use: No  . Drug Use: No  . Sexual Activity: Not Currently   Other Topics Concern  . Not on file   Social History Narrative  . No narrative on file     Filed Vitals:   07/24/13 1028  BP: 115/81  Pulse: 91  Height: 5\' 3"  (1.6 m)  Weight: 100 lb 6.4 oz (45.541 kg)    PHYSICAL EXAM General: NAD, wearing oxygen Neck: No JVD, no thyromegaly or thyroid nodule.  Lungs: Clear to auscultation  bilaterally with normal respiratory effort. CV: Nondisplaced PMI.  Heart regular S1/S2, no S3/S4, no murmur.  No peripheral edema.  No carotid bruit.  Normal pedal pulses.  Abdomen: Soft, nontender, no hepatosplenomegaly, no distention.  Neurologic: Alert and oriented x 3.  Psych: Normal affect. Extremities: No clubbing or cyanosis.   ECG: reviewed and available in electronic records.      ASSESSMENT AND PLAN: 1. Takotsubo cardiomyopathy: LV systolic function has now normalized. She has been off of diltiazem and lisinopril and is symptomatically stable. No further testing is warranted. 2. COPD: oxygen-dependent.  Dispo: f/u 6 months.   Prentice Docker, M.D., F.A.C.C.

## 2013-07-24 NOTE — Patient Instructions (Signed)
   Remain off of the Lisinopril & Diltiazem Continue all other medications.   Your physician wants you to follow up in: 6 months.  You will receive a reminder letter in the mail one-two months in advance.  If you don't receive a letter, please call our office to schedule the follow up appointment

## 2013-09-09 ENCOUNTER — Other Ambulatory Visit (HOSPITAL_COMMUNITY): Payer: Self-pay | Admitting: Physician Assistant

## 2014-05-24 ENCOUNTER — Ambulatory Visit (INDEPENDENT_AMBULATORY_CARE_PROVIDER_SITE_OTHER): Payer: BC Managed Care – PPO | Admitting: Cardiovascular Disease

## 2014-05-24 ENCOUNTER — Encounter: Payer: Self-pay | Admitting: Cardiovascular Disease

## 2014-05-24 VITALS — BP 117/86 | HR 102 | Ht 63.0 in | Wt 93.0 lb

## 2014-05-24 DIAGNOSIS — R000 Tachycardia, unspecified: Secondary | ICD-10-CM

## 2014-05-24 DIAGNOSIS — I5181 Takotsubo syndrome: Secondary | ICD-10-CM

## 2014-05-24 DIAGNOSIS — J438 Other emphysema: Secondary | ICD-10-CM

## 2014-05-24 DIAGNOSIS — J439 Emphysema, unspecified: Secondary | ICD-10-CM

## 2014-05-24 DIAGNOSIS — G252 Other specified forms of tremor: Secondary | ICD-10-CM

## 2014-05-24 DIAGNOSIS — G250 Essential tremor: Secondary | ICD-10-CM

## 2014-05-24 MED ORDER — PROPRANOLOL HCL 20 MG PO TABS: 20.0000 mg | ORAL_TABLET | Freq: Two times a day (BID) | ORAL | Status: DC

## 2014-05-24 NOTE — Patient Instructions (Signed)
   Begin Propranolol 20mg  twice a day  - new sent to pharm Continue all other medications.   Your physician wants you to follow up in: 6 months.  You will receive a reminder letter in the mail one-two months in advance.  If you don't receive a letter, please call our office to schedule the follow up appointment

## 2014-05-24 NOTE — Progress Notes (Signed)
Patient ID: Sarah ContesRhonda Chan, female   DOB: Jan 05, 1966, 48 y.o.   MRN: [id number]      SUBJECTIVE: The patient is here to follow up for Takotsubo cardiomyopathy leading to a NSTEMI. She has normal coronary arteries. Her LV systolic function has since normalized. She denies chest pain and shortness of breath. Her primary complaint relates to tremors of both her hands and her feet. She has difficulty writing her name. She seldom has palpitations. ECG performed in the office today demonstrates sinus rhythm at a rate of 100 beats per minute.   Review of Systems: As per "subjective", otherwise negative.  Allergies  Allergen Reactions  . Doxycycline Nausea Only    Current Outpatient Prescriptions  Medication Sig Dispense Refill  . albuterol (PROVENTIL HFA;VENTOLIN HFA) 108 (90 BASE) MCG/ACT inhaler Inhale 2 puffs into the lungs every 6 (six) hours as needed for wheezing or shortness of breath.      Marland Kitchen. albuterol (PROVENTIL) (2.5 MG/3ML) 0.083% nebulizer solution Take 2.5 mg by nebulization every 6 (six) hours as needed for wheezing or shortness of breath.      . ALPRAZolam (XANAX) 0.5 MG tablet Take 0.5 mg by mouth 3 (three) times daily as needed for sleep.      . Azelastine HCl 0.15 % SOLN Place 0.15 application into the nose at bedtime.      . budesonide-formoterol (SYMBICORT) 160-4.5 MCG/ACT inhaler Inhale 2 puffs into the lungs 2 (two) times daily.      . cyanocobalamin (,VITAMIN B-12,) 1000 MCG/ML injection Inject 1,000 mcg into the muscle every 30 (thirty) days.      Marland Kitchen. FLUoxetine (PROZAC) 40 MG capsule Take 40 mg by mouth 2 (two) times daily.      Marland Kitchen. gabapentin (NEURONTIN) 600 MG tablet Take 600 mg by mouth 3 (three) times daily.      Marland Kitchen. ibandronate (BONIVA) 150 MG tablet Take 150 mg by mouth every 30 (thirty) days. Take in the morning with a full glass of water, on an empty stomach, and do not take anything else by mouth or lie down for the next 30 min.      . megestrol (MEGACE) 40 MG tablet  Take 40 mg by mouth 2 (two) times daily.      . Milnacipran (SAVELLA) 50 MG TABS tablet Take 50 mg by mouth 2 (two) times daily.      Marland Kitchen. oxyCODONE-acetaminophen (PERCOCET) 5-325 MG per tablet Take 1 tablet by mouth 3 (three) times daily.      . OXYGEN-HELIUM IN Inhale 3 L into the lungs.      . potassium chloride SA (K-DUR,KLOR-CON) 20 MEQ tablet TAKE ONE TABLET BY MOUTH DAILY  30 tablet  2  . predniSONE (DELTASONE) 10 MG tablet Take 10 mg by mouth 2 (two) times daily. 2 tabs twice a day for 5 days , then 2 tabs for 5 daily, then one tab for 5 days, for a total of 35mg       . roflumilast (DALIRESP) 500 MCG TABS tablet Take 500 mcg by mouth daily.      Marland Kitchen. tiotropium (SPIRIVA) 18 MCG inhalation capsule Place 18 mcg into inhaler and inhale daily.      . traZODone (DESYREL) 50 MG tablet Take 50 mg by mouth at bedtime.       No current facility-administered medications for this visit.    Past Medical History  Diagnosis Date  . COPD (chronic obstructive pulmonary disease)     Oxygen dependent  . Pneumonia   .  Fibromyalgia   . Anxiety   . GERD (gastroesophageal reflux disease)   . Takotsubo cardiomyopathy     a. NSTEMI 04/2013 secondary to Takotsubo's CM - normal cors, EF 30%.  . Depression   . IBS (irritable bowel syndrome)   . Back pain   . Neck pain     Past Surgical History  Procedure Laterality Date  . Cholecystectomy    . Abdominal hysterectomy    . C-secition      History   Social History  . Marital Status: Unknown    Spouse Name: N/A    Number of Children: N/A  . Years of Education: N/A   Occupational History  . disabled    Social History Main Topics  . Smoking status: Former Smoker -- 0.50 packs/day for 20 years    Types: Cigarettes    Quit date: 04/19/2013  . Smokeless tobacco: Not on file  . Alcohol Use: No  . Drug Use: No  . Sexual Activity: Not Currently   Other Topics Concern  . Not on file   Social History Narrative  . No narrative on file      Filed Vitals:   05/24/14 1038  BP: 117/86  Pulse: 102  Height: 5\' 3"  (1.6 m)  Weight: 93 lb (42.185 kg)  SpO2: 98%    PHYSICAL EXAM General: NAD, wearing oxygen by nasal cannula. Neck: No JVD, no thyromegaly. Lungs: Diminished air entry b/l, no crackles. CV: Nondisplaced PMI. Upper normal rate and regular rhythm, normal S1/S2, no S3/S4, no murmur. No pretibial or periankle edema.  No carotid bruit.  Normal pedal pulses.  Abdomen: Soft, nontender, no hepatosplenomegaly, no distention.  Neurologic: Alert and oriented x 3. Tremors of both arms/hands and feet/legs. Psych: Normal affect. Extremities: No clubbing or cyanosis.   ECG: reviewed and available in electronic records.    ASSESSMENT AND PLAN: 1. Takotsubo cardiomyopathy: LV systolic function has since normalized. No further testing is warranted. Given her tachycardia and essential tremors, I will start propranolol 20 mg bid which should help to treat both. 2. COPD: Oxygen-dependent.  3. Tachycardia: So as to prevent the development of a tachycardia-mediated cardiomyopathy, I will start propranolol. 4. Essential tremors: Will treat with propranolol 20 mg bid.  Dispo: f/u 6 months.  Prentice Docker, M.D., F.A.C.C.

## 2014-09-16 ENCOUNTER — Encounter (HOSPITAL_COMMUNITY): Payer: Self-pay | Admitting: Cardiovascular Disease

## 2014-10-14 ENCOUNTER — Encounter (HOSPITAL_COMMUNITY)
Admission: RE | Admit: 2014-10-14 | Discharge: 2014-10-14 | Disposition: A | Discharge status: Home / Self Care | Payer: BLUE CROSS/BLUE SHIELD | Source: Ambulatory Visit | Attending: Pulmonary Disease | Admitting: Pulmonary Disease

## 2014-10-14 ENCOUNTER — Encounter (HOSPITAL_COMMUNITY): Payer: Self-pay

## 2014-10-14 VITALS — BP 138/86 | HR 86 | Ht 63.0 in | Wt 104.5 lb

## 2014-10-14 DIAGNOSIS — J441 Chronic obstructive pulmonary disease with (acute) exacerbation: Secondary | ICD-10-CM

## 2014-10-14 NOTE — Psychosocial Assessment (Signed)
Sarah Chan 49 y.o. female  Initial Psychosocial Assessment  Pt psychosocial assessment reveals pt lives with their son. Pt is currently unemployed, disabled. Pt hobbies include swimming, dancing, and spending time with her son. Pt reports her stress level is moderate. Areas of stress/anxiety include Health Family home.  Pt does exhibit signs of depression. Signs of depression include helplessness, hopelessness and sadness and difficulty falling asleep and difficulty maintaining sleep. Pt shows fair  coping skills with positive outlook . Staff offered emotional support and reassurance. Monitor and evaluate progress toward psychosocial goal(s).  Goal(s): Improved management of stress, depression, anxiety Improved coping skills  Help patient work toward returning to meaningful activities that improve patient's QOL and are attainable with patient's lung disease Increase strength and energy Take better care of self, appearance, and son    10/14/2014 2:53 PM

## 2014-10-14 NOTE — Progress Notes (Signed)
Pt has finished orientation and is scheduled to start CR on Tuesday, October 19, 2014 at 10:45am. Pt has been instructed to arrive to class 15 minutes early for scheduled class. Pt has been instructed to wear comfortable clothing and shoes with rubber soles. Pt has been told to take their medications 1 hour prior to coming to class.  If the patient is not going to attend class, he/she has been instructed to call.

## 2014-10-14 NOTE — Patient Instructions (Signed)
Patient referred to Pulmonary Rehab by Dr. Donnal MoatUlep due to COPD.  Dr. Donnal MoatUlep is her pulmonologist and does not current have a PCP.  During orientation advised patient on arrival and appointment times what to wear, what to do before, during and after exercise.  Reviewed attendance and class policy.  Talked about inclement weather and class consultation policy. Patient is scheduled to start Pulmonary Rehab on Tuesday, October 19, 2014 at 10:45am.  Patient was advised to come to class 5 minutes before class starts.  She was also given instructions on meeting with the dietician and attending the Family Structure classes. Pt is eager to get started.  Pt was able to complete 6 minute walk test without any s/s.

## 2014-10-14 NOTE — Progress Notes (Signed)
[facility] Pulmonary Rehabilitation Baseline Outcomes Assessment   Anthropometrics:  . Height (inches): 63 . Weight (kg): 47.3 . Grip strength was measured using a Dynamometer.  The patient's highest score was a 66.  Functional Status/Exercise Capacity: . Sarah Chan had a resting heart rate of 86 BPM, a resting blood pressure of 138/86, and an oxygen saturation of 97 % on 3 liters of O2.  Sarah Chan performed a 6-minute walk test on 10/14/14.  The patient completed 700 feet in 6 minutes with 0 rest breaks.  This quantifies 2.02 METS.   Dyspnea Measures: . The [facility] is a simple and standardized method of classifying disability in patients with COPD.  The assessment correlates disability and dyspnea.  At entrance the patient scored a 3. The scale is provided below.   0= I only get breathless with strenuous exercise. 1= I get short of breath when hurrying on level ground or walking up a slight incline. 2= On level ground, I walk slower than people of the same age because of breathlessness, or have to stop for breath when walking at my own pace. 3= I stop for breath after walking 100 yards or after a few minutes on level ground. 4=I am too breathless to leave the house or I am breathless when dressing.   . The patient completed the [facility] Shortness Of Breath Questionnaire ([facility]).  This questionnaire relates activities of daily living and shortness of breath.  The score ranges from 0-120, a higher score relates to severe shortness of breath during activities of daily living. The patient's score at entrance was 68.  Quality of Life: . Ferrans and Powers Quality of Life Index Pulmonary Version is used to assess the patients satisfaction in different domains of their life; health and functioning, socioeconomic, psychological/spiritual, and family. The overall score is recorded out of 30 points.  The patient's goal is to achieve an overall score of 21 or higher.  Sarah Chan  received a 9.89 at entrance.  . The Patient Health Questionnaire (PHQ-2) is a first step approach for the screening of depression.  If the patient scores positive on the PHQ-2 the patient should be further assessed with the PHQ-9.  The Patient Health Questionnaire (PHQ-9) assesses the degree of depression.  Depression is important to monitor and track in pulmonary patients due to its prevalence in the population.  If the patient advances to the PHQ-9 the goal is to score less than 4 on this assessment.  Sarah Chan scored a 11 at entrance.  Clinical Assessment Tools: . The COPD Assessment Test (CAT) is a measurement tool to quantify how much of an impact the disease has on the patient's life.  This assessment aids the Pulmonary Rehab Team in designing the patients individualized treatment plan.  A CAT score ranges from 0-40.  A score of 10 or below indicates that COPD has a low impact on the patient's life whereas a score of 30 or higher indicates a severe impact. The patient's goal is a decrease of 1 point from entrance to discharge.  Sarah Chan had a CAT score of 28 at entrance.  Nutrition: . The "Rate My Plate" is a dietary assessment that quantifies the balance of a patient's diet.  This tool allows the Pulmonary Rehab Team to key in on the areas of the patient's diet that needs improving.  The team can then focus their nutritional education on those areas.  If the patient scores 24-40, this means there are many ways they  can make their eating habits healthier, 41-57 states that there are some ways they can make their eating habits healthier and a score of 58-72 states that they are making many healthy choices.  The patient's goal is to achieve a score of 49 or higher on this assessment.  Sarah Chan scored a 24 at entrance.  Oxygen Compliance: . Patient is currently on 2.5 to 3 liters at rest, 2.5 to 3 liters at night, and 2.5 to 3 liters for exercise.  [name] is currently not using a cpap/bipap at night. The  patient states that they do not have barriers that keep them from using their oxygen.  Education: . Sarah Chan will attend education classes during the course of Pulmonary Rehab.  Education classes that will be offered to the patient are Activities of Daily Living and Energy Conservation, Pursed Lip Breathing and Diaphragmatic Breathing, Nutrition, Exercise for the Pulmonary Patient, Warning Signs of Infection, Chronic Lung Disease, Advanced Directives, Medications, and Stress and Meditation.  The patient completed an assessment at the entrance of the program and will complete it again upon discharge to demonstrate the level of understanding provided by the educational classes.  This assessment includes 14 questions regarding all of the education topics above.  Sarah Chan achieved a score of 9/14 at entrance.  Smoking Cessation: N/A  Exercise:  Sarah Chan will be provided with an individualized Home Exercise Prescription (HEP) at the entrance of the program.  The patient will be followed by the Pulmonary Exercise Physiologist throughout the program to assist with the progression of the frequency, intensity, time, and type of exercise. The patient's long-term goal is to be exercising 30-60 minutes, 3-5 days per week. At entrance, the patient was exercising 3 days at home.

## 2014-10-19 ENCOUNTER — Encounter (HOSPITAL_COMMUNITY): Payer: BLUE CROSS/BLUE SHIELD

## 2014-10-19 ENCOUNTER — Telehealth: Payer: Self-pay | Admitting: *Deleted

## 2014-10-19 MED ORDER — AMLODIPINE BESYLATE 5 MG PO TABS: 5.0000 mg | ORAL_TABLET | Freq: Every day | ORAL | Status: DC

## 2014-10-19 NOTE — Telephone Encounter (Signed)
-----   Message from Laqueta LindenSuresh A Koneswaran, MD sent at 10/19/2014 11:41 AM EST ----- Regarding: Please arrange for [place] next [place] Can we please start amlodipine 5 mg daily? Thanks.  Dr. Purvis SheffieldKoneswaran   ----- Message -----    From: Rolene Courseiane B Coad    Sent: 10/19/2014  11:06 AM      To: Laqueta LindenSuresh A Koneswaran, MD Subject: Patient BP extremely High                      Dr. Purvis SheffieldKoneswaran, As we discussed on the phone, your patient came in to exercise for Pulmonary Rehab. Her BP was 220/120 (L) arm, then rested about 6 mins 190/102, then we rested her 15-20 minutes. Took BP again it was 170/100 (R), 210/118 (L). Her HR 91 at rest. She had NSTEMI/Takotsubo in 05/02/2013. We will wait until she sees you before we start her in Pulmonary Rehab.   I am also checking her insurance to see if they will pay for the NSTEMI/Takotsubo so that we can switch her to Cardiac Rehab so that she can be monitored. Thanks so much. Diane HoneywellCoad

## 2014-10-19 NOTE — Telephone Encounter (Signed)
Patient notified.  [facility]  Follow up scheduled for 10/27/14 with Dr. Purvis SheffieldKoneswaran.

## 2014-10-21 ENCOUNTER — Encounter (HOSPITAL_COMMUNITY): Payer: BLUE CROSS/BLUE SHIELD

## 2014-10-26 ENCOUNTER — Encounter (HOSPITAL_COMMUNITY): Payer: BLUE CROSS/BLUE SHIELD

## 2014-10-27 ENCOUNTER — Encounter: Payer: Self-pay | Admitting: Cardiovascular Disease

## 2014-10-27 ENCOUNTER — Ambulatory Visit (INDEPENDENT_AMBULATORY_CARE_PROVIDER_SITE_OTHER): Payer: BLUE CROSS/BLUE SHIELD | Admitting: Cardiovascular Disease

## 2014-10-27 VITALS — BP 110/82 | HR 110 | Ht 63.0 in | Wt 100.0 lb

## 2014-10-27 DIAGNOSIS — I252 Old myocardial infarction: Secondary | ICD-10-CM

## 2014-10-27 DIAGNOSIS — G250 Essential tremor: Secondary | ICD-10-CM

## 2014-10-27 DIAGNOSIS — I10 Essential (primary) hypertension: Secondary | ICD-10-CM

## 2014-10-27 DIAGNOSIS — J439 Emphysema, unspecified: Secondary | ICD-10-CM

## 2014-10-27 DIAGNOSIS — R000 Tachycardia, unspecified: Secondary | ICD-10-CM

## 2014-10-27 DIAGNOSIS — I5181 Takotsubo syndrome: Secondary | ICD-10-CM

## 2014-10-27 HISTORY — DX: Essential (primary) hypertension: I10

## 2014-10-27 MED ORDER — METOPROLOL SUCCINATE ER 25 MG PO TB24: 25.0000 mg | ORAL_TABLET | Freq: Every day | ORAL | Status: DC

## 2014-10-27 NOTE — Patient Instructions (Signed)
Your physician recommends that you schedule a follow-up appointment in: 6 months. You will receive a reminder letter in the mail in about 4 months reminding you to call and schedule your appointment. If you don't receive this letter, please contact our office. Your physician has recommended you make the following change in your medication:  Start toprol xl 25 mg (metoprolol succinate) daily. Continue all other medications the same.

## 2014-10-27 NOTE — Progress Notes (Signed)
Patient ID: Sarah Chan, female   DOB: 07/20/1966, 49 y.o.   MRN: [id number]      SUBJECTIVE: The patient is here to follow up for malignant hypertension, noted in cardiac rehab where SBP's ran between 210-220 mmHg for which I recently started amlodipine 5 mg. She also has a history ofTakotsubo cardiomyopathy leading to a NSTEMI. She has normal coronary arteries. Her LV systolic function has since normalized. She denies chest pain and worsening of her chronic shortness of breath.   Review of Systems: As per "subjective", otherwise negative.  Allergies  Allergen Reactions  . Doxycycline Nausea Only    Current Outpatient Prescriptions  Medication Sig Dispense Refill  . albuterol (PROVENTIL HFA;VENTOLIN HFA) 108 (90 BASE) MCG/ACT inhaler Inhale 2 puffs into the lungs every 6 (six) hours as needed for wheezing or shortness of breath.    Marland Kitchen. albuterol (PROVENTIL) (2.5 MG/3ML) 0.083% nebulizer solution Take 2.5 mg by nebulization every 6 (six) hours as needed for wheezing or shortness of breath.    . ALPRAZolam (XANAX) 0.5 MG tablet Take 0.5 mg by mouth 3 (three) times daily as needed for sleep.    Marland Kitchen. amLODipine (NORVASC) 5 MG tablet Take 1 tablet (5 mg total) by mouth daily. 30 tablet 6  . Azelastine HCl 0.15 % SOLN Place 0.15 application into the nose at bedtime.    . budesonide-formoterol (SYMBICORT) 160-4.5 MCG/ACT inhaler Inhale 2 puffs into the lungs 2 (two) times daily.    . cyanocobalamin (,VITAMIN B-12,) 1000 MCG/ML injection Inject 1,000 mcg into the muscle every 30 (thirty) days.    Marland Kitchen. FLUoxetine (PROZAC) 40 MG capsule Take 40 mg by mouth 2 (two) times daily.    . fluticasone (FLONASE) 50 MCG/ACT nasal spray Place 1 spray into both nostrils daily.    Marland Kitchen. gabapentin (NEURONTIN) 600 MG tablet Take 600 mg by mouth 3 (three) times daily.    Marland Kitchen. HYDROcodone-acetaminophen (NORCO/VICODIN) 5-325 MG per tablet Take 1 tablet by mouth every 6 (six) hours as needed for moderate pain.    Marland Kitchen.  ibandronate (BONIVA) 150 MG tablet Take 150 mg by mouth every 30 (thirty) days. Take in the morning with a full glass of water, on an empty stomach, and do not take anything else by mouth or lie down for the next 30 min.    . lansoprazole (PREVACID) 15 MG capsule Take 15 mg by mouth daily at 12 noon.    . Milnacipran (SAVELLA) 50 MG TABS tablet Take 50 mg by mouth 2 (two) times daily.    . mirtazapine (REMERON) 15 MG tablet Take 15 mg by mouth at bedtime.    Marland Kitchen. omega-3 acid ethyl esters (LOVAZA) 1 G capsule Take by mouth 2 (two) times daily.    . OXYGEN-HELIUM IN Inhale 3 L into the lungs.    . potassium chloride SA (K-DUR,KLOR-CON) 20 MEQ tablet TAKE ONE TABLET BY MOUTH DAILY 30 tablet 2  . predniSONE (DELTASONE) 10 MG tablet Take 10 mg by mouth 2 (two) times daily. 2 tabs twice a day for 5 days , then 2 tabs for 5 daily, then one tab for 5 days, for a total of 35mg     . roflumilast (DALIRESP) 500 MCG TABS tablet Take 500 mcg by mouth daily.    Marland Kitchen. tiotropium (SPIRIVA) 18 MCG inhalation capsule Place 18 mcg into inhaler and inhale daily.     No current facility-administered medications for this visit.    Past Medical History  Diagnosis Date  . COPD (chronic  obstructive pulmonary disease)     Oxygen dependent  . Pneumonia   . Fibromyalgia   . Anxiety   . GERD (gastroesophageal reflux disease)   . Takotsubo cardiomyopathy     a. NSTEMI 04/2013 secondary to Takotsubo's CM - normal cors, EF 30%.  . Depression   . IBS (irritable bowel syndrome)   . Back pain   . Neck pain   . HTN (hypertension), malignant 10/27/2014    Past Surgical History  Procedure Laterality Date  . Cholecystectomy    . Abdominal hysterectomy    . C-secition    . Left heart catheterization with coronary angiogram N/A 05/04/2013    Procedure: LEFT HEART CATHETERIZATION WITH CORONARY ANGIOGRAM;  Surgeon: Wendall Stade, MD;  Location: [facility] CATH LAB;  Service: Cardiovascular;  Laterality: N/A;  . Cardiac catheterization        History   Social History  . Marital Status: Legally Separated    Spouse Name: N/A    Number of Children: N/A  . Years of Education: N/A   Occupational History  . disabled    Social History Main Topics  . Smoking status: Former Smoker -- 0.50 packs/day for 20 years    Types: Cigarettes    Quit date: 04/19/2013  . Smokeless tobacco: Current User     Comment: e-cigg occiasionally  . Alcohol Use: No  . Drug Use: No  . Sexual Activity: Not Currently   Other Topics Concern  . Not on file   Social History Narrative     Filed Vitals:   10/27/14 0914  BP: 110/82  Pulse: 110  Height:  (1.6 m)  Weight: 100 lb (45.36 kg)  SpO2: 98%    PHYSICAL EXAM General: NAD, wearing oxygen by nasal cannula. Neck: No JVD, no thyromegaly. Lungs: Diminished air entry b/l, no crackles. CV: Nondisplaced PMI. Upper normal rate and regular rhythm, normal S1/S2, no S3/S4, no murmur. No pretibial or periankle edema. No carotid bruit. Normal pedal pulses.  Abdomen: Soft, nontender, no hepatosplenomegaly, no distention.  Neurologic: Alert and oriented x 3. Tremors of both arms/hands and feet/legs. Psych: Normal affect. Extremities: No clubbing or cyanosis.  Musculoskeletal: Normal range of motion, no gross deformities. Skin: Normal.  ECG: Most recent ECG reviewed.      ASSESSMENT AND PLAN: 1. Takotsubo cardiomyopathy: LV systolic function has since normalized. No further testing is warranted. Given her tachycardia and essential tremors, I previously started propranolol 20 mg bid to help to treat both, but she is no longer taking it as she felt it didn't help. I will start Toprol-XL 25 mg daily. 2. COPD: Oxygen-dependent and stable. 3. Tachycardia: So as to prevent the development of a tachycardia-mediated cardiomyopathy, I will start Toprol-XL 25 mg daily. 4. Essential tremors: I prescribed propranolol 20 mg bid but she is no longer taking it as she felt it didn't help. 5.  Malignant HTN: Well controlled on amlodipine 5 mg. No changes.  Dispo: f/u 6 months.   Prentice Docker, M.D., F.A.C.C.

## 2014-10-28 ENCOUNTER — Encounter (HOSPITAL_COMMUNITY): Payer: BLUE CROSS/BLUE SHIELD

## 2014-11-02 ENCOUNTER — Encounter (HOSPITAL_COMMUNITY): Admission: RE | Admit: 2014-11-02 | Payer: BLUE CROSS/BLUE SHIELD | Source: Ambulatory Visit

## 2014-11-04 ENCOUNTER — Encounter (HOSPITAL_COMMUNITY): Payer: BLUE CROSS/BLUE SHIELD

## 2014-11-09 ENCOUNTER — Encounter (HOSPITAL_COMMUNITY)
Admission: RE | Admit: 2014-11-09 | Discharge: 2014-11-09 | Disposition: A | Discharge status: Home / Self Care | Payer: BLUE CROSS/BLUE SHIELD | Source: Ambulatory Visit | Attending: Pulmonary Disease | Admitting: Pulmonary Disease

## 2014-11-09 DIAGNOSIS — J441 Chronic obstructive pulmonary disease with (acute) exacerbation: Secondary | ICD-10-CM | POA: Diagnosis present

## 2014-11-11 ENCOUNTER — Encounter (HOSPITAL_COMMUNITY): Payer: BLUE CROSS/BLUE SHIELD

## 2014-11-16 ENCOUNTER — Encounter (HOSPITAL_COMMUNITY)
Admission: RE | Admit: 2014-11-16 | Discharge: 2014-11-16 | Disposition: A | Discharge status: Home / Self Care | Payer: BLUE CROSS/BLUE SHIELD | Source: Ambulatory Visit | Attending: Pulmonary Disease | Admitting: Pulmonary Disease

## 2014-11-16 DIAGNOSIS — J441 Chronic obstructive pulmonary disease with (acute) exacerbation: Secondary | ICD-10-CM | POA: Diagnosis not present

## 2014-11-18 ENCOUNTER — Encounter (HOSPITAL_COMMUNITY)
Admission: RE | Admit: 2014-11-18 | Discharge: 2014-11-18 | Disposition: A | Discharge status: Home / Self Care | Payer: BLUE CROSS/BLUE SHIELD | Source: Ambulatory Visit | Attending: Pulmonary Disease | Admitting: Pulmonary Disease

## 2014-11-18 DIAGNOSIS — J441 Chronic obstructive pulmonary disease with (acute) exacerbation: Secondary | ICD-10-CM | POA: Diagnosis not present

## 2014-11-19 NOTE — Progress Notes (Signed)
Pulmonary Rehabilitation Program Outcomes Report   Orientation:  10/14/14 Graduate Date:  tbd Discharge Date:  tbd # of sessions completed: 3  Pulmonologist: Ulep Family MD:  Ulep Class Time:  1045  A.  Exercise Program:  Tolerates exercise @ 3.08 METS for 15 minutes and Walk Test Results:  Pre: 2.02 with wheelchair assistance, using 3L [place]  B.  Mental Health:  Good mental attitude and PHQ-9: 11  C.  Education/Instruction/Skills  Knows THR for exercise  Uses Perceived Exertion Scale and/or Dyspnea Scale  D.  Nutrition/Weight Control/Body Composition:  Adherence to prescribed nutrition program: good    E.  Blood Lipids    Lab Results  Component Value Date   CHOL 164 05/03/2013   HDL 80 05/03/2013   LDLCALC 69 05/03/2013   TRIG 77 05/03/2013   CHOLHDL 2.1 05/03/2013    F.  Lifestyle Changes:  Making positive lifestyle changes  G.  Symptoms noted with exercise:  Asymptomatic  Report Completed By:  Doretha Sou Kinsie Belford RN   Comments:  First week progress note.

## 2014-11-23 ENCOUNTER — Encounter (HOSPITAL_COMMUNITY): Payer: BLUE CROSS/BLUE SHIELD

## 2014-11-25 ENCOUNTER — Encounter (HOSPITAL_COMMUNITY)
Admission: RE | Admit: 2014-11-25 | Discharge: 2014-11-25 | Disposition: A | Discharge status: Home / Self Care | Payer: BLUE CROSS/BLUE SHIELD | Source: Ambulatory Visit | Attending: Pulmonary Disease | Admitting: Pulmonary Disease

## 2014-11-25 DIAGNOSIS — J441 Chronic obstructive pulmonary disease with (acute) exacerbation: Secondary | ICD-10-CM | POA: Diagnosis not present

## 2014-11-30 ENCOUNTER — Encounter (HOSPITAL_COMMUNITY): Payer: BLUE CROSS/BLUE SHIELD

## 2014-12-02 ENCOUNTER — Encounter (HOSPITAL_COMMUNITY): Payer: BLUE CROSS/BLUE SHIELD

## 2014-12-07 ENCOUNTER — Encounter (HOSPITAL_COMMUNITY): Payer: BLUE CROSS/BLUE SHIELD

## 2014-12-09 ENCOUNTER — Encounter (HOSPITAL_COMMUNITY): Payer: BLUE CROSS/BLUE SHIELD

## 2014-12-09 NOTE — Progress Notes (Signed)
Sarah Chan Kindle 49 y.o. female  30 day Psychosocial Note  Patient psychosocial assessment reveals no barriers to participation in Pulmonary Rehab. Psychosocial areas that are currently affecting patient's rehab experience include concerns about financial resources,health issues and self appearance as evidenced by verbalization. Since patient started, pt states is getting better with the self appearance and that makes her feel better. Patient does continue to exhibit positive coping skills to deal with her psychosocial concerns. Offered emotional support and reassurance. Patient does feel she is making progress toward Pulmonary Rehab goals. Patient reports her health and activity level has improved slightly in the past 30 days as evidenced by patient's report of increased ability to to take better care of self.  Patient reports feeling positive about current and projected progression in Pulmonary Rehab. After reviewing the patient's treatment plan, the patient is making progress toward Pulmonary Rehab goals. Patient's rate of progress toward rehab goals is good. Plan of action to help patient continue to work towards rehab goals include encouragement, exercise, and education. Will continue to monitor and evaluate progress toward psychosocial goal(s).  Goal(s) in progress: Continued Improvement of management of depression Increase strength and energy Take better care of self Help patient work toward returning to meaningful activities that improve patient's QOL and are attainable with patient's lung disease

## 2014-12-14 ENCOUNTER — Other Ambulatory Visit: Payer: Self-pay | Admitting: *Deleted

## 2014-12-14 ENCOUNTER — Encounter (HOSPITAL_COMMUNITY): Payer: BLUE CROSS/BLUE SHIELD

## 2014-12-14 MED ORDER — AMLODIPINE BESYLATE 5 MG PO TABS: 5.0000 mg | ORAL_TABLET | Freq: Every day | ORAL | Status: DC

## 2014-12-14 MED ORDER — METOPROLOL SUCCINATE ER 25 MG PO TB24: 25.0000 mg | ORAL_TABLET | Freq: Every day | ORAL | Status: DC

## 2014-12-16 ENCOUNTER — Encounter (HOSPITAL_COMMUNITY): Payer: BLUE CROSS/BLUE SHIELD

## 2014-12-21 ENCOUNTER — Encounter (HOSPITAL_COMMUNITY): Payer: BLUE CROSS/BLUE SHIELD

## 2014-12-23 ENCOUNTER — Encounter (HOSPITAL_COMMUNITY): Payer: BLUE CROSS/BLUE SHIELD

## 2014-12-28 ENCOUNTER — Encounter (HOSPITAL_COMMUNITY): Payer: BLUE CROSS/BLUE SHIELD

## 2014-12-30 ENCOUNTER — Encounter (HOSPITAL_COMMUNITY): Payer: BLUE CROSS/BLUE SHIELD

## 2015-01-04 ENCOUNTER — Encounter (HOSPITAL_COMMUNITY): Payer: BLUE CROSS/BLUE SHIELD

## 2015-01-06 ENCOUNTER — Encounter (HOSPITAL_COMMUNITY): Payer: BLUE CROSS/BLUE SHIELD

## 2015-02-01 ENCOUNTER — Encounter (HOSPITAL_COMMUNITY)
Admission: RE | Admit: 2015-02-01 | Discharge: 2015-02-01 | Disposition: A | Discharge status: Home / Self Care | Payer: BLUE CROSS/BLUE SHIELD | Source: Ambulatory Visit | Attending: Pulmonary Disease | Admitting: Pulmonary Disease

## 2015-02-01 DIAGNOSIS — J449 Chronic obstructive pulmonary disease, unspecified: Secondary | ICD-10-CM | POA: Diagnosis present

## 2015-02-01 NOTE — Progress Notes (Signed)
Pt started back CR today at her 5th visit. Pt has been unable to attend CR due to having home health visits-insurance would not pay for both at the same time.

## 2015-02-03 ENCOUNTER — Encounter (HOSPITAL_COMMUNITY): Payer: BLUE CROSS/BLUE SHIELD

## 2015-02-08 ENCOUNTER — Encounter (HOSPITAL_COMMUNITY): Payer: BLUE CROSS/BLUE SHIELD

## 2015-02-10 ENCOUNTER — Encounter (HOSPITAL_COMMUNITY): Payer: BLUE CROSS/BLUE SHIELD

## 2015-02-15 ENCOUNTER — Encounter (HOSPITAL_COMMUNITY)
Admission: RE | Admit: 2015-02-15 | Discharge: 2015-02-15 | Disposition: A | Discharge status: Home / Self Care | Payer: BLUE CROSS/BLUE SHIELD | Source: Ambulatory Visit | Attending: Pulmonary Disease | Admitting: Pulmonary Disease

## 2015-02-15 DIAGNOSIS — J449 Chronic obstructive pulmonary disease, unspecified: Secondary | ICD-10-CM | POA: Diagnosis present

## 2015-02-17 ENCOUNTER — Encounter (HOSPITAL_COMMUNITY): Payer: BLUE CROSS/BLUE SHIELD

## 2015-02-22 ENCOUNTER — Encounter (HOSPITAL_COMMUNITY): Payer: BLUE CROSS/BLUE SHIELD

## 2015-02-22 NOTE — Progress Notes (Signed)
Patient has stopped [facility] Cardiac/Pulmonary Rehab program due to health and other issues per patient.

## 2015-02-23 NOTE — Addendum Note (Signed)
Encounter addended by: Julious PayerAlysia R Marshelle Bilger, CCT on: 02/23/2015  3:32 PM<BR>     Documentation filed: Notes Section

## 2015-02-24 ENCOUNTER — Encounter (HOSPITAL_COMMUNITY): Payer: BLUE CROSS/BLUE SHIELD

## 2015-02-24 NOTE — Progress Notes (Signed)
[facility] Pulmonary Rehabilitation                                                             Final/Discharge Outcome Results  Anthropometrics: . Height (inches): 63 . Weight (kg): 45.6 ? This is a change of -1.7kg from entrance.   Functional Status/Exercise Capacity: ? Sarah Chan had a resting heart rate of 77 BPM, a resting blood pressure of 124/68, and an oxygen saturation of 96 % on 3 liters of O2.   Education: ? Sarah Chan attended 3/13 education classes.    Patient completed 4 sessions in the program and then stopped due to health and personal reasons. Further discharge outcomes are not applicable due to patient stopping the program.

## 2015-03-01 ENCOUNTER — Encounter (HOSPITAL_COMMUNITY): Payer: BLUE CROSS/BLUE SHIELD

## 2015-03-03 ENCOUNTER — Encounter (HOSPITAL_COMMUNITY): Payer: BLUE CROSS/BLUE SHIELD

## 2015-03-08 ENCOUNTER — Encounter (HOSPITAL_COMMUNITY): Payer: BLUE CROSS/BLUE SHIELD

## 2015-03-10 ENCOUNTER — Encounter (HOSPITAL_COMMUNITY): Payer: BLUE CROSS/BLUE SHIELD

## 2015-03-15 ENCOUNTER — Encounter (HOSPITAL_COMMUNITY): Payer: BLUE CROSS/BLUE SHIELD

## 2015-03-17 ENCOUNTER — Encounter (HOSPITAL_COMMUNITY): Payer: BLUE CROSS/BLUE SHIELD

## 2015-03-22 ENCOUNTER — Encounter (HOSPITAL_COMMUNITY): Payer: BLUE CROSS/BLUE SHIELD

## 2015-03-24 ENCOUNTER — Encounter (HOSPITAL_COMMUNITY): Payer: BLUE CROSS/BLUE SHIELD

## 2015-03-29 ENCOUNTER — Encounter (HOSPITAL_COMMUNITY): Payer: BLUE CROSS/BLUE SHIELD

## 2015-03-31 ENCOUNTER — Encounter (HOSPITAL_COMMUNITY): Payer: BLUE CROSS/BLUE SHIELD

## 2015-04-05 ENCOUNTER — Encounter (HOSPITAL_COMMUNITY): Payer: BLUE CROSS/BLUE SHIELD

## 2015-04-07 ENCOUNTER — Encounter (HOSPITAL_COMMUNITY): Payer: BLUE CROSS/BLUE SHIELD

## 2015-04-12 ENCOUNTER — Encounter (HOSPITAL_COMMUNITY): Payer: BLUE CROSS/BLUE SHIELD

## 2015-04-14 ENCOUNTER — Encounter (HOSPITAL_COMMUNITY): Payer: BLUE CROSS/BLUE SHIELD

## 2015-04-19 ENCOUNTER — Encounter (HOSPITAL_COMMUNITY): Payer: BLUE CROSS/BLUE SHIELD

## 2015-04-21 ENCOUNTER — Encounter (HOSPITAL_COMMUNITY): Payer: BLUE CROSS/BLUE SHIELD

## 2015-08-10 ENCOUNTER — Ambulatory Visit (INDEPENDENT_AMBULATORY_CARE_PROVIDER_SITE_OTHER): Payer: BLUE CROSS/BLUE SHIELD | Admitting: Cardiovascular Disease

## 2015-08-10 ENCOUNTER — Encounter: Payer: Self-pay | Admitting: Cardiovascular Disease

## 2015-08-10 VITALS — BP 123/82 | HR 99 | Ht 63.0 in | Wt 107.0 lb

## 2015-08-10 DIAGNOSIS — J439 Emphysema, unspecified: Secondary | ICD-10-CM | POA: Diagnosis not present

## 2015-08-10 DIAGNOSIS — R000 Tachycardia, unspecified: Secondary | ICD-10-CM

## 2015-08-10 DIAGNOSIS — I10 Essential (primary) hypertension: Secondary | ICD-10-CM | POA: Diagnosis not present

## 2015-08-10 DIAGNOSIS — I5181 Takotsubo syndrome: Secondary | ICD-10-CM | POA: Diagnosis not present

## 2015-08-10 DIAGNOSIS — I252 Old myocardial infarction: Secondary | ICD-10-CM

## 2015-08-10 MED ORDER — METOPROLOL SUCCINATE ER 25 MG PO TB24
25.0000 mg | ORAL_TABLET | Freq: Two times a day (BID) | ORAL | Status: DC
Start: 2015-08-10 — End: 2016-10-12

## 2015-08-10 NOTE — Progress Notes (Signed)
Patient ID: Sarah Chan, female   DOB: [birth year], 49 y.o.   MRN: [id number]      SUBJECTIVE: The patient percent for follow-up of tachycardia and malignant hypertension. She also has a history of Takotsubo cardiomyopathy/NSTEMI and has normal coronary arteries. Echo on 07/22/13 showed normal LV systolic function, EF 50-55%.  ECG performed in the office today demonstrates normal sinus rhythm, heart rate 97 bpm.  Denies chest pain and palpitations as well as leg swelling. She feels that she is more fatigued in the past one year.  Review of Systems: As per "subjective", otherwise negative.  Allergies  Allergen Reactions  . Doxycycline Nausea Only  . Trintellix [Vortioxetine] Nausea Only and Other (See Comments)    constipation    Current Outpatient Prescriptions  Medication Sig Dispense Refill  . albuterol (PROVENTIL HFA;VENTOLIN HFA) 108 (90 BASE) MCG/ACT inhaler Inhale 2 puffs into the lungs every 6 (six) hours as needed for wheezing or shortness of breath.    Marland Kitchen. albuterol (PROVENTIL) (2.5 MG/3ML) 0.083% nebulizer solution Take 2.5 mg by nebulization every 6 (six) hours as needed for wheezing or shortness of breath.    . ALPRAZolam (XANAX) 0.5 MG tablet Take 0.5 mg by mouth 3 (three) times daily as needed for sleep.    Marland Kitchen. amLODipine (NORVASC) 5 MG tablet Take 1 tablet (5 mg total) by mouth daily. 90 tablet 3  . Azelastine HCl 0.15 % SOLN Place 0.15 application into the nose at bedtime.    . budesonide-formoterol (SYMBICORT) 160-4.5 MCG/ACT inhaler Inhale 2 puffs into the lungs 2 (two) times daily.    . cyanocobalamin (,VITAMIN B-12,) 1000 MCG/ML injection Inject 1,000 mcg into the muscle every 30 (thirty) days.    Marland Kitchen. FLUoxetine (PROZAC) 40 MG capsule Take 40 mg by mouth 2 (two) times daily.    . fluticasone (FLONASE) 50 MCG/ACT nasal spray Place 1 spray into both nostrils daily.    Marland Kitchen. gabapentin (NEURONTIN) 600 MG tablet Take 600 mg by mouth 3 (three) times daily.    Marland Kitchen.  HYDROcodone-acetaminophen (NORCO/VICODIN) 5-325 MG per tablet Take 1 tablet by mouth every 6 (six) hours as needed for moderate pain.    Marland Kitchen. ibandronate (BONIVA) 150 MG tablet Take 150 mg by mouth every 30 (thirty) days. Take in the morning with a full glass of water, on an empty stomach, and do not take anything else by mouth or lie down for the next 30 min.    . lansoprazole (PREVACID) 15 MG capsule Take 15 mg by mouth daily at 12 noon.    . metoprolol succinate (TOPROL XL) 25 MG 24 hr tablet Take 1 tablet (25 mg total) by mouth daily. 90 tablet 3  . Milnacipran (SAVELLA) 50 MG TABS tablet Take 50 mg by mouth 2 (two) times daily.    . mirtazapine (REMERON) 15 MG tablet Take 15 mg by mouth at bedtime.    Marland Kitchen. omega-3 acid ethyl esters (LOVAZA) 1 G capsule Take by mouth 2 (two) times daily.    . OXYGEN-HELIUM IN Inhale 3 L into the lungs.    . potassium chloride SA (K-DUR,KLOR-CON) 20 MEQ tablet TAKE ONE TABLET BY MOUTH DAILY 30 tablet 2  . predniSONE (DELTASONE) 10 MG tablet Take 10 mg by mouth 2 (two) times daily. 2 tabs twice a day for 5 days , then 2 tabs for 5 daily, then one tab for 5 days, for a total of 35mg     . roflumilast (DALIRESP) 500 MCG TABS tablet Take 500 mcg by  mouth daily.    Marland Kitchen tiotropium (SPIRIVA) 18 MCG inhalation capsule Place 18 mcg into inhaler and inhale daily.     No current facility-administered medications for this visit.    Past Medical History  Diagnosis Date  . COPD (chronic obstructive pulmonary disease) (HCC)     Oxygen dependent  . Pneumonia   . Fibromyalgia   . Anxiety   . GERD (gastroesophageal reflux disease)   . Takotsubo cardiomyopathy     a. NSTEMI 04/2013 secondary to Takotsubo's CM - normal cors, EF 30%.  . Depression   . IBS (irritable bowel syndrome)   . Back pain   . Neck pain   . HTN (hypertension), malignant 10/27/2014    Past Surgical History  Procedure Laterality Date  . Cholecystectomy    . Abdominal hysterectomy    . C-secition    .  Left heart catheterization with coronary angiogram N/A 05/04/2013    Procedure: LEFT HEART CATHETERIZATION WITH CORONARY ANGIOGRAM;  Surgeon: Wendall Stade, MD;  Location: [facility] CATH LAB;  Service: Cardiovascular;  Laterality: N/A;  . Cardiac catheterization      Social History   Social History  . Marital Status: Legally Separated    Spouse Name: N/A  . Number of Children: N/A  . Years of Education: N/A   Occupational History  . disabled    Social History Main Topics  . Smoking status: Former Smoker -- 0.50 packs/day for 20 years    Types: Cigarettes    Start date: 04/19/1993    Quit date: 04/19/2013  . Smokeless tobacco: Current User     Comment: e-cigg occiasionally  . Alcohol Use: No  . Drug Use: No  . Sexual Activity: Not Currently   Other Topics Concern  . Not on file   Social History Narrative     Filed Vitals:   08/10/15 1456  BP: 123/82  Pulse: 99  Height:  (1.6 m)  Weight: 107 lb (48.535 kg)  SpO2: 95%    PHYSICAL EXAM General: NAD, wearing oxygen by nasal cannula. Neck: No JVD, no thyromegaly. Lungs: Diminished air entry b/l, no crackles. CV: Nondisplaced PMI. Upper normal rate and regular rhythm, normal S1/S2, no S3/S4, no murmur. No pretibial or periankle edema. No carotid bruit. Abdomen: Soft,  no distention.  Neurologic: Alert and oriented x 3.  Psych: Normal affect. Extremities: No clubbing or cyanosis.  Musculoskeletal: Normal range of motion, no gross deformities. Skin: Normal.   ECG: Most recent ECG reviewed.      ASSESSMENT AND PLAN: 1. Takotsubo cardiomyopathy: LV systolic function has since normalized. No further testing is warranted. Given tachycardia, will increase Toprol-XL to 50 mg twice daily.  2. COPD: Oxygen-dependent and stable.  3. Tachycardia: Will increase Toprol-XL to 50 mg twice daily.  4. Malignant HTN: Well controlled on amlodipine 5 mg. No changes.  Dispo: f/u 1 year.   Prentice Docker, M.D.,  F.A.C.C.

## 2015-08-10 NOTE — Patient Instructions (Signed)
Your physician has recommended you make the following change in your medication:  Increase toprol xl 25 mg to twice daily. Continue all other medications the same. Your physician recommends that you schedule a follow-up appointment in: 1 year. You will receive a reminder letter in the mail in about 10 months reminding you to call and schedule your appointment. If you don't receive this letter, please contact our office.

## 2015-10-12 DIAGNOSIS — Z418 Encounter for other procedures for purposes other than remedying health state: Secondary | ICD-10-CM | POA: Diagnosis not present

## 2015-10-12 DIAGNOSIS — Z6820 Body mass index (BMI) 20.0-20.9, adult: Secondary | ICD-10-CM | POA: Diagnosis not present

## 2015-10-12 DIAGNOSIS — Z87891 Personal history of nicotine dependence: Secondary | ICD-10-CM | POA: Diagnosis not present

## 2015-10-12 DIAGNOSIS — J449 Chronic obstructive pulmonary disease, unspecified: Secondary | ICD-10-CM | POA: Diagnosis not present

## 2015-10-20 DIAGNOSIS — I10 Essential (primary) hypertension: Secondary | ICD-10-CM | POA: Diagnosis present

## 2015-10-20 DIAGNOSIS — Z87891 Personal history of nicotine dependence: Secondary | ICD-10-CM | POA: Diagnosis not present

## 2015-10-20 DIAGNOSIS — E876 Hypokalemia: Secondary | ICD-10-CM | POA: Diagnosis present

## 2015-10-20 DIAGNOSIS — J9622 Acute and chronic respiratory failure with hypercapnia: Secondary | ICD-10-CM | POA: Diagnosis not present

## 2015-10-20 DIAGNOSIS — J9692 Respiratory failure, unspecified with hypercapnia: Secondary | ICD-10-CM | POA: Diagnosis not present

## 2015-10-20 DIAGNOSIS — K589 Irritable bowel syndrome without diarrhea: Secondary | ICD-10-CM | POA: Diagnosis not present

## 2015-10-20 DIAGNOSIS — I252 Old myocardial infarction: Secondary | ICD-10-CM | POA: Diagnosis not present

## 2015-10-20 DIAGNOSIS — M549 Dorsalgia, unspecified: Secondary | ICD-10-CM | POA: Diagnosis present

## 2015-10-20 DIAGNOSIS — J9601 Acute respiratory failure with hypoxia: Secondary | ICD-10-CM | POA: Diagnosis present

## 2015-10-20 DIAGNOSIS — J9621 Acute and chronic respiratory failure with hypoxia: Secondary | ICD-10-CM | POA: Diagnosis not present

## 2015-10-20 DIAGNOSIS — I5181 Takotsubo syndrome: Secondary | ICD-10-CM | POA: Diagnosis present

## 2015-10-20 DIAGNOSIS — F329 Major depressive disorder, single episode, unspecified: Secondary | ICD-10-CM | POA: Diagnosis present

## 2015-10-20 DIAGNOSIS — F419 Anxiety disorder, unspecified: Secondary | ICD-10-CM | POA: Diagnosis present

## 2015-10-20 DIAGNOSIS — K449 Diaphragmatic hernia without obstruction or gangrene: Secondary | ICD-10-CM | POA: Diagnosis present

## 2015-10-20 DIAGNOSIS — G8929 Other chronic pain: Secondary | ICD-10-CM | POA: Diagnosis present

## 2015-10-20 DIAGNOSIS — M797 Fibromyalgia: Secondary | ICD-10-CM | POA: Diagnosis present

## 2015-10-20 DIAGNOSIS — R0602 Shortness of breath: Secondary | ICD-10-CM | POA: Diagnosis not present

## 2015-10-20 DIAGNOSIS — Z888 Allergy status to other drugs, medicaments and biological substances status: Secondary | ICD-10-CM | POA: Diagnosis not present

## 2015-10-20 DIAGNOSIS — J9691 Respiratory failure, unspecified with hypoxia: Secondary | ICD-10-CM | POA: Diagnosis not present

## 2015-10-20 DIAGNOSIS — Z9981 Dependence on supplemental oxygen: Secondary | ICD-10-CM | POA: Diagnosis not present

## 2015-10-20 DIAGNOSIS — J441 Chronic obstructive pulmonary disease with (acute) exacerbation: Secondary | ICD-10-CM | POA: Diagnosis present

## 2015-10-20 DIAGNOSIS — E873 Alkalosis: Secondary | ICD-10-CM | POA: Diagnosis present

## 2015-11-17 ENCOUNTER — Other Ambulatory Visit: Payer: Self-pay | Admitting: Cardiovascular Disease

## 2016-07-30 ENCOUNTER — Ambulatory Visit (INDEPENDENT_AMBULATORY_CARE_PROVIDER_SITE_OTHER): Payer: BLUE CROSS/BLUE SHIELD | Admitting: Psychiatry

## 2016-07-30 ENCOUNTER — Encounter (HOSPITAL_COMMUNITY): Payer: Self-pay | Admitting: Psychiatry

## 2016-07-30 VITALS — BP 173/112 | HR 105 | Ht 63.0 in | Wt 105.6 lb

## 2016-07-30 DIAGNOSIS — F322 Major depressive disorder, single episode, severe without psychotic features: Secondary | ICD-10-CM

## 2016-07-30 DIAGNOSIS — Z79899 Other long term (current) drug therapy: Secondary | ICD-10-CM | POA: Diagnosis not present

## 2016-07-30 DIAGNOSIS — Z818 Family history of other mental and behavioral disorders: Secondary | ICD-10-CM

## 2016-07-30 DIAGNOSIS — Z87891 Personal history of nicotine dependence: Secondary | ICD-10-CM

## 2016-07-30 MED ORDER — DULOXETINE HCL 60 MG PO CPEP: 60.0000 mg | ORAL_CAPSULE | Freq: Every day | ORAL | 2 refills | Status: DC

## 2016-07-30 MED ORDER — ALPRAZOLAM 1 MG PO TABS: 1.0000 mg | ORAL_TABLET | Freq: Two times a day (BID) | ORAL | 2 refills | Status: DC
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
## 2016-08-03 ENCOUNTER — Telehealth (HOSPITAL_COMMUNITY): Payer: Self-pay | Admitting: *Deleted

## 2016-08-03 NOTE — Telephone Encounter (Signed)
voice message from patient, Cymbalta was sent to mail order, 30 day.  please change to 90 day-3 month supply.

## 2016-08-04 ENCOUNTER — Other Ambulatory Visit: Payer: Self-pay | Admitting: Cardiovascular Disease

## 2016-08-07 NOTE — Telephone Encounter (Signed)
lmtcb

## 2016-08-09 NOTE — Telephone Encounter (Signed)
Spoke with pt and she stated to disregard message due this being her first fill at local pharmacy. Per pt if med works then pharmacy will have to be changed to mail order

## 2016-08-21 ENCOUNTER — Ambulatory Visit: Payer: Self-pay | Admitting: Cardiovascular Disease

## 2016-08-28 ENCOUNTER — Ambulatory Visit (INDEPENDENT_AMBULATORY_CARE_PROVIDER_SITE_OTHER): Payer: BLUE CROSS/BLUE SHIELD | Admitting: Psychiatry

## 2016-08-28 ENCOUNTER — Encounter (HOSPITAL_COMMUNITY): Payer: Self-pay | Admitting: Psychiatry

## 2016-08-28 VITALS — BP 146/93 | HR 88 | Resp 22 | Wt 107.0 lb

## 2016-08-28 DIAGNOSIS — Z79899 Other long term (current) drug therapy: Secondary | ICD-10-CM

## 2016-08-28 DIAGNOSIS — Z818 Family history of other mental and behavioral disorders: Secondary | ICD-10-CM | POA: Diagnosis not present

## 2016-08-28 DIAGNOSIS — Z888 Allergy status to other drugs, medicaments and biological substances status: Secondary | ICD-10-CM

## 2016-08-28 DIAGNOSIS — Z87891 Personal history of nicotine dependence: Secondary | ICD-10-CM

## 2016-08-28 DIAGNOSIS — F322 Major depressive disorder, single episode, severe without psychotic features: Secondary | ICD-10-CM

## 2016-08-28 DIAGNOSIS — Z9889 Other specified postprocedural states: Secondary | ICD-10-CM | POA: Diagnosis not present

## 2016-08-28 MED ORDER — ALPRAZOLAM 1 MG PO TABS: 1.0000 mg | ORAL_TABLET | Freq: Three times a day (TID) | ORAL | 2 refills | Status: DC

## 2016-08-28 MED ORDER — DULOXETINE HCL 60 MG PO CPEP: 60.0000 mg | ORAL_CAPSULE | Freq: Every day | ORAL | 2 refills | Status: DC
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
## 2016-09-19 ENCOUNTER — Ambulatory Visit: Payer: Self-pay | Admitting: Cardiovascular Disease

## 2016-10-11 ENCOUNTER — Encounter (HOSPITAL_COMMUNITY): Payer: Self-pay | Admitting: Psychiatry

## 2016-10-11 ENCOUNTER — Ambulatory Visit (INDEPENDENT_AMBULATORY_CARE_PROVIDER_SITE_OTHER): Payer: BLUE CROSS/BLUE SHIELD | Admitting: Psychiatry

## 2016-10-11 VITALS — BP 161/110 | HR 107 | Ht 63.0 in | Wt 109.4 lb

## 2016-10-11 DIAGNOSIS — F322 Major depressive disorder, single episode, severe without psychotic features: Secondary | ICD-10-CM | POA: Diagnosis not present

## 2016-10-11 DIAGNOSIS — Z9889 Other specified postprocedural states: Secondary | ICD-10-CM

## 2016-10-11 DIAGNOSIS — Z818 Family history of other mental and behavioral disorders: Secondary | ICD-10-CM | POA: Diagnosis not present

## 2016-10-11 DIAGNOSIS — Z87891 Personal history of nicotine dependence: Secondary | ICD-10-CM | POA: Diagnosis not present

## 2016-10-11 DIAGNOSIS — Z888 Allergy status to other drugs, medicaments and biological substances status: Secondary | ICD-10-CM

## 2016-10-11 DIAGNOSIS — Z79899 Other long term (current) drug therapy: Secondary | ICD-10-CM

## 2016-10-11 MED ORDER — DULOXETINE HCL 60 MG PO CPEP: 60.0000 mg | ORAL_CAPSULE | Freq: Two times a day (BID) | ORAL | 2 refills | Status: DC

## 2016-10-11 MED ORDER — ALPRAZOLAM 1 MG PO TABS: 1.0000 mg | ORAL_TABLET | Freq: Three times a day (TID) | ORAL | 2 refills | Status: DC
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
## 2016-10-12 ENCOUNTER — Ambulatory Visit (INDEPENDENT_AMBULATORY_CARE_PROVIDER_SITE_OTHER): Payer: BLUE CROSS/BLUE SHIELD | Admitting: Cardiovascular Disease

## 2016-10-12 ENCOUNTER — Encounter: Payer: Self-pay | Admitting: Cardiovascular Disease

## 2016-10-12 VITALS — BP 142/90 | HR 86 | Ht 63.0 in | Wt 107.0 lb

## 2016-10-12 DIAGNOSIS — I10 Essential (primary) hypertension: Secondary | ICD-10-CM

## 2016-10-12 DIAGNOSIS — R000 Tachycardia, unspecified: Secondary | ICD-10-CM

## 2016-10-12 DIAGNOSIS — I5181 Takotsubo syndrome: Secondary | ICD-10-CM | POA: Diagnosis not present

## 2016-10-12 MED ORDER — AMLODIPINE BESYLATE 10 MG PO TABS: 10.0000 mg | ORAL_TABLET | Freq: Every day | ORAL | 3 refills | Status: DC

## 2016-10-12 MED ORDER — METOPROLOL SUCCINATE ER 25 MG PO TB24: 25.0000 mg | ORAL_TABLET | Freq: Every day | ORAL | Status: DC

## 2016-10-12 NOTE — Patient Instructions (Addendum)
Medication Instructions:   Increase Norvasc to 10mg  daily - new sent to mail order today.  Continue your Toprol at daily.  Continue all other medications.    Labwork: none  Testing/Procedures: none  Follow-Up: Your physician wants you to follow up in:  1 year.  You will receive a reminder letter in the mail one-two months in advance.  If you don't receive a letter, please call our office to schedule the follow up appointment   Any Other Special Instructions Will Be Listed Below (If Applicable).  If you need a refill on your cardiac medications before your next appointment, please call your pharmacy.

## 2016-10-12 NOTE — Progress Notes (Signed)
SUBJECTIVE: The patient percent for follow-up of tachycardia and malignant hypertension. She also has a history of Takotsubo cardiomyopathy/NSTEMI and has normal coronary arteries. Echo on 07/22/13 showed normal LV systolic function, EF 50-55%.  ECG performed in the office today demonstrates normal sinus rhythm.  Denies chest pain and palpitations. She has had some leg swelling for the past 10 days by the end of the day. Blood pressure yesterday was 161/110 and has been higher than this recently. She has chronic fatigue and was more fatigued recently due to a sinus infection. She uses 3 L of oxygen around-the-clock. Takes Toprol-XL once daily.  Review of Systems: As per "subjective", otherwise negative.  Allergies  Allergen Reactions  . Doxycycline Nausea Only  . Trintellix [Vortioxetine] Nausea Only and Other (See Comments)    constipation    Current Outpatient Prescriptions  Medication Sig Dispense Refill  . albuterol (PROVENTIL HFA;VENTOLIN HFA) 108 (90 BASE) MCG/ACT inhaler Inhale 2 puffs into the lungs every 6 (six) hours as needed for wheezing or shortness of breath.    Marland Kitchen. albuterol (PROVENTIL) (2.5 MG/3ML) 0.083% nebulizer solution Take 2.5 mg by nebulization every 6 (six) hours as needed for wheezing or shortness of breath.    . ALPRAZolam (XANAX) 1 MG tablet Take 1 tablet (1 mg total) by mouth 3 (three) times daily. 90 tablet 2  . amLODipine (NORVASC) 5 MG tablet TAKE 1 TABLET DAILY 90 tablet 3  . Azelastine HCl 0.15 % SOLN Place 0.15 application into the nose at bedtime.    . budesonide-formoterol (SYMBICORT) 160-4.5 MCG/ACT inhaler Inhale 2 puffs into the lungs 2 (two) times daily.    . cyanocobalamin (,VITAMIN B-12,) 1000 MCG/ML injection Inject 1,000 mcg into the muscle every 14 (fourteen) days.     Marland Kitchen. denosumab (PROLIA) 60 MG/ML SOLN injection Inject 60 mg into the skin every 6 (six) months. Administer in upper arm, thigh, or abdomen    . DULoxetine (CYMBALTA) 60  MG capsule Take 1 capsule (60 mg total) by mouth 2 (two) times daily. 180 capsule 2  . fluticasone (FLONASE) 50 MCG/ACT nasal spray Place 1 spray into both nostrils daily.    Marland Kitchen. gabapentin (NEURONTIN) 600 MG tablet Take 600 mg by mouth 3 (three) times daily.    Marland Kitchen. HYDROcodone-acetaminophen (NORCO/VICODIN) 5-325 MG tablet Take 1 tablet by mouth every 4 (four) hours as needed for moderate pain.    . metoprolol succinate (TOPROL XL) 25 MG 24 hr tablet Take 1 tablet (25 mg total) by mouth 2 (two) times daily. 180 tablet 3  . omega-3 acid ethyl esters (LOVAZA) 1 G capsule Take by mouth 2 (two) times daily.    . OXYGEN-HELIUM IN Inhale 3 L into the lungs.    . pantoprazole (PROTONIX) 40 MG tablet Take 40 mg by mouth 2 (two) times daily.    . predniSONE (DELTASONE) 5 MG tablet Take 1 tablet by mouth daily.  0  . tiotropium (SPIRIVA) 18 MCG inhalation capsule Place 18 mcg into inhaler and inhale daily.     No current facility-administered medications for this visit.     Past Medical History:  Diagnosis Date  . Anxiety   . Back pain   . COPD (chronic obstructive pulmonary disease) (HCC)    Oxygen dependent  . Depression   . Fibromyalgia   . GERD (gastroesophageal reflux disease)   . HTN (hypertension), malignant 10/27/2014  . IBS (irritable bowel syndrome)   . Neck pain   . Pneumonia   .  Takotsubo cardiomyopathy    a. NSTEMI 04/2013 secondary to Takotsubo's CM - normal cors, EF 30%.    Past Surgical History:  Procedure Laterality Date  . ABDOMINAL HYSTERECTOMY    . C-Secition    . CARDIAC CATHETERIZATION    . CHOLECYSTECTOMY    . LEFT HEART CATHETERIZATION WITH CORONARY ANGIOGRAM N/A 05/04/2013   Procedure: LEFT HEART CATHETERIZATION WITH CORONARY ANGIOGRAM;  Surgeon: Wendall Stade, MD;  Location: [facility] CATH LAB;  Service: Cardiovascular;  Laterality: N/A;    Social History   Social History  . Marital status: Legally Separated    Spouse name: N/A  . Number of children: N/A  . Years of  education: N/A   Occupational History  . disabled    Social History Main Topics  . Smoking status: Former Smoker    Packs/day: 0.50    Years: 20.00    Types: Cigarettes    Start date: 04/19/1993    Quit date: 04/19/2013  . Smokeless tobacco: Current User     Comment: e-cigg occiasionally  . Alcohol use No     Comment: 07-30-2016 per pt no but stopped in 2012  . Drug use: No     Comment: 07-30-2016 per pt no   . Sexual activity: Not Currently   Other Topics Concern  . Not on file   Social History Narrative  . No narrative on file     Vitals:   10/12/16 1257  BP: (!) 142/90  Pulse: 86  SpO2: 100%  Weight: 107 lb (48.5 kg)  Height: 5\' 3"  (1.6 m)    PHYSICAL EXAM General: NAD, wearing oxygen by nasal cannula. Neck: No JVD, no thyromegaly. Lungs: Diminished air entry b/l, no crackles. CV: Nondisplaced PMI.  Regular rate and rhythm, normal S1/S2, no S3/S4, no murmur. No pretibial or periankle edema.    Abdomen: Soft, nontender, no distention.  Neurologic: Alert and oriented.  Psych: Normal affect. Skin: Normal. Musculoskeletal: No gross deformities.    ECG: Most recent ECG reviewed.      ASSESSMENT AND PLAN: 1. Takotsubo cardiomyopathy: LV systolic function has since normalized. No further testing is warranted. Continue Toprol-XL.  2. COPD: Oxygen-dependent and stable.  3. Tachycardia: Controlled on Toprol-XL at current dose. No changes.  4. Malignant HTN: Elevated. Increase amlodipine to 10 mg.  Dispo: f/u 1 year.   Prentice Docker, M.D., F.A.C.C.

## 2016-10-18 DIAGNOSIS — Z6821 Body mass index (BMI) 21.0-21.9, adult: Secondary | ICD-10-CM | POA: Diagnosis not present

## 2016-10-18 DIAGNOSIS — F329 Major depressive disorder, single episode, unspecified: Secondary | ICD-10-CM | POA: Diagnosis not present

## 2016-10-18 DIAGNOSIS — R600 Localized edema: Secondary | ICD-10-CM | POA: Diagnosis not present

## 2016-10-18 DIAGNOSIS — J449 Chronic obstructive pulmonary disease, unspecified: Secondary | ICD-10-CM | POA: Diagnosis not present

## 2016-10-18 DIAGNOSIS — Z299 Encounter for prophylactic measures, unspecified: Secondary | ICD-10-CM | POA: Diagnosis not present

## 2016-10-18 DIAGNOSIS — Z87891 Personal history of nicotine dependence: Secondary | ICD-10-CM | POA: Diagnosis not present

## 2016-10-18 DIAGNOSIS — M797 Fibromyalgia: Secondary | ICD-10-CM | POA: Diagnosis not present

## 2016-10-25 ENCOUNTER — Other Ambulatory Visit (HOSPITAL_COMMUNITY): Payer: Self-pay | Admitting: Psychiatry

## 2016-11-02 DIAGNOSIS — M545 Low back pain: Secondary | ICD-10-CM | POA: Diagnosis not present

## 2016-11-02 DIAGNOSIS — G8929 Other chronic pain: Secondary | ICD-10-CM | POA: Diagnosis not present

## 2016-11-02 DIAGNOSIS — M797 Fibromyalgia: Secondary | ICD-10-CM | POA: Diagnosis not present

## 2016-11-02 DIAGNOSIS — G894 Chronic pain syndrome: Secondary | ICD-10-CM | POA: Diagnosis not present

## 2016-11-22 ENCOUNTER — Encounter (HOSPITAL_COMMUNITY): Payer: Self-pay | Admitting: Psychiatry

## 2016-11-22 ENCOUNTER — Ambulatory Visit (INDEPENDENT_AMBULATORY_CARE_PROVIDER_SITE_OTHER): Payer: BLUE CROSS/BLUE SHIELD | Admitting: Psychiatry

## 2016-11-22 VITALS — BP 140/97 | HR 88 | Ht 63.0 in | Wt 115.2 lb

## 2016-11-22 DIAGNOSIS — Z888 Allergy status to other drugs, medicaments and biological substances status: Secondary | ICD-10-CM | POA: Diagnosis not present

## 2016-11-22 DIAGNOSIS — Z79899 Other long term (current) drug therapy: Secondary | ICD-10-CM

## 2016-11-22 DIAGNOSIS — Z90710 Acquired absence of both cervix and uterus: Secondary | ICD-10-CM | POA: Diagnosis not present

## 2016-11-22 DIAGNOSIS — Z9049 Acquired absence of other specified parts of digestive tract: Secondary | ICD-10-CM

## 2016-11-22 DIAGNOSIS — F322 Major depressive disorder, single episode, severe without psychotic features: Secondary | ICD-10-CM

## 2016-11-22 DIAGNOSIS — Z9889 Other specified postprocedural states: Secondary | ICD-10-CM | POA: Diagnosis not present

## 2016-11-22 DIAGNOSIS — Z87891 Personal history of nicotine dependence: Secondary | ICD-10-CM

## 2016-11-22 DIAGNOSIS — Z818 Family history of other mental and behavioral disorders: Secondary | ICD-10-CM | POA: Diagnosis not present

## 2016-11-22 MED ORDER — DULOXETINE HCL 60 MG PO CPEP: 60.0000 mg | ORAL_CAPSULE | Freq: Two times a day (BID) | ORAL | 2 refills | Status: DC

## 2016-11-22 MED ORDER — ALPRAZOLAM 1 MG PO TABS: 1.0000 mg | ORAL_TABLET | Freq: Three times a day (TID) | ORAL | 2 refills | Status: DC
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
## 2016-12-13 DIAGNOSIS — Z780 Asymptomatic menopausal state: Secondary | ICD-10-CM | POA: Diagnosis not present

## 2016-12-13 DIAGNOSIS — Z90710 Acquired absence of both cervix and uterus: Secondary | ICD-10-CM | POA: Diagnosis not present

## 2016-12-13 DIAGNOSIS — Z79890 Hormone replacement therapy (postmenopausal): Secondary | ICD-10-CM | POA: Diagnosis not present

## 2016-12-13 DIAGNOSIS — Z1272 Encounter for screening for malignant neoplasm of vagina: Secondary | ICD-10-CM | POA: Diagnosis not present

## 2016-12-28 DIAGNOSIS — J301 Allergic rhinitis due to pollen: Secondary | ICD-10-CM | POA: Diagnosis not present

## 2016-12-28 DIAGNOSIS — J449 Chronic obstructive pulmonary disease, unspecified: Secondary | ICD-10-CM | POA: Diagnosis not present

## 2016-12-28 DIAGNOSIS — J329 Chronic sinusitis, unspecified: Secondary | ICD-10-CM | POA: Diagnosis not present

## 2017-01-10 ENCOUNTER — Telehealth: Payer: Self-pay | Admitting: Cardiovascular Disease

## 2017-01-10 NOTE — Telephone Encounter (Signed)
Sarah Chan called about swelling to her ankles, feet for approximately one week now.

## 2017-01-10 NOTE — Telephone Encounter (Signed)
Ankles & feet swelling x 1 week now.  No weight gain.  States that she does notice some redness occasionally when swelling is there.  Does wear oxygen.  Recently increased her Norvasc to , only made the change about 1 month ago. Message fwd to provider for further advice.

## 2017-01-11 MED ORDER — FUROSEMIDE 20 MG PO TABS: ORAL_TABLET | ORAL | 2 refills | Status: DC

## 2017-01-11 NOTE — Telephone Encounter (Signed)
Start Lasix 20 mg daily x 3 days. Can then take prn. What is BP?

## 2017-01-11 NOTE — Telephone Encounter (Signed)
Patient notified.  Will send new medication to [facility] [address].  Stated that she does not have machine at home, but is planning to buy monitor.  States that she feels like it has been fine though.  Suggested she monitor after she buys her machine & let us know of any changes.  She verbalized understanding.

## 2017-01-14 ENCOUNTER — Ambulatory Visit (INDEPENDENT_AMBULATORY_CARE_PROVIDER_SITE_OTHER): Payer: BLUE CROSS/BLUE SHIELD | Admitting: Psychiatry

## 2017-01-14 ENCOUNTER — Encounter (HOSPITAL_COMMUNITY): Payer: Self-pay | Admitting: Psychiatry

## 2017-01-14 VITALS — BP 129/92 | HR 92 | Ht 63.0 in | Wt 112.6 lb

## 2017-01-14 DIAGNOSIS — Z79899 Other long term (current) drug therapy: Secondary | ICD-10-CM

## 2017-01-14 DIAGNOSIS — Z87891 Personal history of nicotine dependence: Secondary | ICD-10-CM

## 2017-01-14 DIAGNOSIS — F322 Major depressive disorder, single episode, severe without psychotic features: Secondary | ICD-10-CM | POA: Diagnosis not present

## 2017-01-14 DIAGNOSIS — Z818 Family history of other mental and behavioral disorders: Secondary | ICD-10-CM

## 2017-01-14 MED ORDER — VILAZODONE HCL 40 MG PO TABS: 40.0000 mg | ORAL_TABLET | Freq: Every day | ORAL | 2 refills | Status: DC

## 2017-01-14 MED ORDER — ALPRAZOLAM 1 MG PO TABS: 1.0000 mg | ORAL_TABLET | Freq: Three times a day (TID) | ORAL | 2 refills | Status: DC
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
## 2017-01-14 NOTE — Patient Instructions (Signed)
Start Viibryd as directed Take Cymbalta 60 mg daily for one week, then d/c

## 2017-01-15 ENCOUNTER — Telehealth (HOSPITAL_COMMUNITY): Payer: Self-pay | Admitting: *Deleted

## 2017-01-15 NOTE — Telephone Encounter (Signed)
Prior authorization for Viibryd received. Submitted online with cover my meds. Medication was approved case [id number] from 12/16/16-01/15/18. Called to notify pharmacy.

## 2017-01-16 NOTE — Telephone Encounter (Signed)
noted 

## 2017-01-22 DIAGNOSIS — J342 Deviated nasal septum: Secondary | ICD-10-CM | POA: Diagnosis not present

## 2017-01-22 DIAGNOSIS — J329 Chronic sinusitis, unspecified: Secondary | ICD-10-CM | POA: Diagnosis not present

## 2017-01-24 ENCOUNTER — Telehealth: Payer: Self-pay | Admitting: Cardiovascular Disease

## 2017-01-24 MED ORDER — HYDRALAZINE HCL 25 MG PO TABS: 25.0000 mg | ORAL_TABLET | Freq: Three times a day (TID) | ORAL | 3 refills | Status: DC

## 2017-01-24 NOTE — Telephone Encounter (Signed)
Patient is unsure what BP's have been reading d/t not having access to checking it at home. New prescription sent in to pharmacy. Patient verbalized understanding. Patient would try to get a bp machine when she picked up new medication today.

## 2017-01-24 NOTE — Telephone Encounter (Signed)
Possibly related to increased amlodipine dose. Cut back to 5 mg. Can take Lasix 40 mg daily x 3 days. Start hydralazine 25 mg tid for BP control. What have BP's been?

## 2017-01-24 NOTE — Telephone Encounter (Signed)
Pt says she started swelling in the ankles/feet again. Per last phone note pt was to take lasix 20 mg daily for 3 days then prn - pt says she has been taking lasix every other day for the last week but feet/ankles are still swelling and red - denies any weight gain/chest pain/dizzines - says maybe some worsening of SOB  (on oxygen)

## 2017-01-24 NOTE — Telephone Encounter (Signed)
Still having trouble with swelling.  Changes do not seem to be getting all fluid off

## 2017-01-25 DIAGNOSIS — M545 Low back pain: Secondary | ICD-10-CM | POA: Diagnosis not present

## 2017-01-25 DIAGNOSIS — M461 Sacroiliitis, not elsewhere classified: Secondary | ICD-10-CM | POA: Diagnosis not present

## 2017-01-25 DIAGNOSIS — G894 Chronic pain syndrome: Secondary | ICD-10-CM | POA: Diagnosis not present

## 2017-01-25 DIAGNOSIS — G8929 Other chronic pain: Secondary | ICD-10-CM | POA: Diagnosis not present

## 2017-01-30 DIAGNOSIS — J9611 Chronic respiratory failure with hypoxia: Secondary | ICD-10-CM | POA: Diagnosis not present

## 2017-01-30 DIAGNOSIS — J449 Chronic obstructive pulmonary disease, unspecified: Secondary | ICD-10-CM | POA: Diagnosis not present

## 2017-01-30 DIAGNOSIS — J0181 Other acute recurrent sinusitis: Secondary | ICD-10-CM | POA: Diagnosis not present

## 2017-02-04 ENCOUNTER — Other Ambulatory Visit (HOSPITAL_COMMUNITY): Payer: Self-pay | Admitting: Psychiatry

## 2017-02-04 ENCOUNTER — Telehealth (HOSPITAL_COMMUNITY): Payer: Self-pay | Admitting: *Deleted

## 2017-02-04 MED ORDER — VILAZODONE HCL 40 MG PO TABS: 40.0000 mg | ORAL_TABLET | Freq: Every day | ORAL | 2 refills | Status: DC

## 2017-02-04 NOTE — Telephone Encounter (Signed)
Pt pharmacy [facility] faxed office for 90 days supply for pt Viibryd 40 mg QD. Pt medication was last filled on 01-14-2017 with 30 tabs 2 refills and [facility] was [facility] [place].

## 2017-02-04 NOTE — Telephone Encounter (Signed)
noted 

## 2017-02-04 NOTE — Telephone Encounter (Signed)
sent 

## 2017-02-15 ENCOUNTER — Encounter (HOSPITAL_COMMUNITY): Payer: Self-pay | Admitting: Psychiatry

## 2017-02-15 ENCOUNTER — Ambulatory Visit (INDEPENDENT_AMBULATORY_CARE_PROVIDER_SITE_OTHER): Payer: BLUE CROSS/BLUE SHIELD | Admitting: Psychiatry

## 2017-02-15 VITALS — BP 130/80 | Ht 63.0 in | Wt 110.0 lb

## 2017-02-15 DIAGNOSIS — Z87891 Personal history of nicotine dependence: Secondary | ICD-10-CM | POA: Diagnosis not present

## 2017-02-15 DIAGNOSIS — Z79899 Other long term (current) drug therapy: Secondary | ICD-10-CM | POA: Diagnosis not present

## 2017-02-15 DIAGNOSIS — F322 Major depressive disorder, single episode, severe without psychotic features: Secondary | ICD-10-CM

## 2017-02-15 DIAGNOSIS — Z818 Family history of other mental and behavioral disorders: Secondary | ICD-10-CM

## 2017-02-15 DIAGNOSIS — J301 Allergic rhinitis due to pollen: Secondary | ICD-10-CM | POA: Diagnosis not present

## 2017-02-15 MED ORDER — ALPRAZOLAM 1 MG PO TABS: 1.0000 mg | ORAL_TABLET | Freq: Three times a day (TID) | ORAL | 1 refills | Status: DC
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
## 2017-02-28 ENCOUNTER — Other Ambulatory Visit: Payer: Self-pay | Admitting: Cardiovascular Disease

## 2017-02-28 MED ORDER — HYDRALAZINE HCL 25 MG PO TABS: 25.0000 mg | ORAL_TABLET | Freq: Three times a day (TID) | ORAL | 3 refills | Status: DC

## 2017-02-28 NOTE — Telephone Encounter (Signed)
° ° ° °  1. Which medications need to be refilled? (please list name of each medication and dose if known)  HYDRALAZINE  25 MG  2. Which pharmacy/location (including street and city if local pharmacy) is medication to be sent to? [facility]  3. Do they need a 30 day or 90 day supply? 90

## 2017-03-06 ENCOUNTER — Other Ambulatory Visit: Payer: Self-pay | Admitting: *Deleted

## 2017-03-06 MED ORDER — AMLODIPINE BESYLATE 5 MG PO TABS: 5.0000 mg | ORAL_TABLET | Freq: Every day | ORAL | 0 refills | Status: DC

## 2017-03-06 MED ORDER — AMLODIPINE BESYLATE 5 MG PO TABS: 5.0000 mg | ORAL_TABLET | Freq: Every day | ORAL | 1 refills | Status: DC

## 2017-03-11 ENCOUNTER — Telehealth (HOSPITAL_COMMUNITY): Payer: Self-pay | Admitting: *Deleted

## 2017-03-11 NOTE — Telephone Encounter (Signed)
PATIENT SAID TO DISREGARD PHONE CALL.  SHE FINALLY RECEIVED HER MAIL ORDER MEDICATIONS.

## 2017-03-19 DIAGNOSIS — Z1389 Encounter for screening for other disorder: Secondary | ICD-10-CM | POA: Diagnosis not present

## 2017-03-19 DIAGNOSIS — J449 Chronic obstructive pulmonary disease, unspecified: Secondary | ICD-10-CM | POA: Diagnosis not present

## 2017-03-19 DIAGNOSIS — Z299 Encounter for prophylactic measures, unspecified: Secondary | ICD-10-CM | POA: Diagnosis not present

## 2017-03-19 DIAGNOSIS — F419 Anxiety disorder, unspecified: Secondary | ICD-10-CM | POA: Diagnosis not present

## 2017-03-19 DIAGNOSIS — Z6820 Body mass index (BMI) 20.0-20.9, adult: Secondary | ICD-10-CM | POA: Diagnosis not present

## 2017-03-19 DIAGNOSIS — F329 Major depressive disorder, single episode, unspecified: Secondary | ICD-10-CM | POA: Diagnosis not present

## 2017-03-19 DIAGNOSIS — M810 Age-related osteoporosis without current pathological fracture: Secondary | ICD-10-CM | POA: Diagnosis not present

## 2017-03-19 DIAGNOSIS — I2510 Atherosclerotic heart disease of native coronary artery without angina pectoris: Secondary | ICD-10-CM | POA: Diagnosis not present

## 2017-03-19 DIAGNOSIS — R142 Eructation: Secondary | ICD-10-CM | POA: Diagnosis not present

## 2017-03-19 DIAGNOSIS — G250 Essential tremor: Secondary | ICD-10-CM | POA: Diagnosis not present

## 2017-03-19 DIAGNOSIS — I10 Essential (primary) hypertension: Secondary | ICD-10-CM | POA: Diagnosis not present

## 2017-03-20 DIAGNOSIS — J329 Chronic sinusitis, unspecified: Secondary | ICD-10-CM | POA: Diagnosis not present

## 2017-03-20 DIAGNOSIS — J321 Chronic frontal sinusitis: Secondary | ICD-10-CM | POA: Diagnosis not present

## 2017-03-26 DIAGNOSIS — J329 Chronic sinusitis, unspecified: Secondary | ICD-10-CM | POA: Diagnosis not present

## 2017-04-12 ENCOUNTER — Ambulatory Visit (INDEPENDENT_AMBULATORY_CARE_PROVIDER_SITE_OTHER): Payer: BLUE CROSS/BLUE SHIELD | Admitting: Psychiatry

## 2017-04-12 ENCOUNTER — Encounter (HOSPITAL_COMMUNITY): Payer: Self-pay | Admitting: Psychiatry

## 2017-04-12 VITALS — BP 130/86 | HR 98 | Ht 63.0 in | Wt 107.0 lb

## 2017-04-12 DIAGNOSIS — Z818 Family history of other mental and behavioral disorders: Secondary | ICD-10-CM

## 2017-04-12 DIAGNOSIS — F322 Major depressive disorder, single episode, severe without psychotic features: Secondary | ICD-10-CM

## 2017-04-12 DIAGNOSIS — Z87891 Personal history of nicotine dependence: Secondary | ICD-10-CM | POA: Diagnosis not present

## 2017-04-12 MED ORDER — VILAZODONE HCL 40 MG PO TABS: 40.0000 mg | ORAL_TABLET | Freq: Every day | ORAL | 2 refills | Status: DC
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
## 2017-04-24 ENCOUNTER — Other Ambulatory Visit: Payer: Self-pay | Admitting: Cardiovascular Disease

## 2017-05-30 ENCOUNTER — Other Ambulatory Visit: Payer: Self-pay | Admitting: *Deleted

## 2017-05-30 MED ORDER — FUROSEMIDE 20 MG PO TABS: 20.0000 mg | ORAL_TABLET | ORAL | 2 refills | Status: DC | PRN

## 2017-07-08 ENCOUNTER — Ambulatory Visit (HOSPITAL_COMMUNITY): Payer: Medicare Other | Admitting: Psychiatry

## 2017-07-12 ENCOUNTER — Ambulatory Visit (HOSPITAL_COMMUNITY): Payer: Medicare Other | Admitting: Psychiatry

## 2017-07-24 DIAGNOSIS — D509 Iron deficiency anemia, unspecified: Secondary | ICD-10-CM | POA: Diagnosis not present

## 2017-07-25 ENCOUNTER — Ambulatory Visit (INDEPENDENT_AMBULATORY_CARE_PROVIDER_SITE_OTHER): Payer: Medicare Other | Admitting: Psychiatry

## 2017-07-25 ENCOUNTER — Encounter (HOSPITAL_COMMUNITY): Payer: Self-pay | Admitting: Psychiatry

## 2017-07-25 ENCOUNTER — Telehealth (HOSPITAL_COMMUNITY): Payer: Self-pay | Admitting: *Deleted

## 2017-07-25 VITALS — BP 134/86 | HR 96 | Ht 63.0 in | Wt 102.2 lb

## 2017-07-25 DIAGNOSIS — F322 Major depressive disorder, single episode, severe without psychotic features: Secondary | ICD-10-CM | POA: Diagnosis not present

## 2017-07-25 DIAGNOSIS — R4582 Worries: Secondary | ICD-10-CM

## 2017-07-25 DIAGNOSIS — R5383 Other fatigue: Secondary | ICD-10-CM

## 2017-07-25 DIAGNOSIS — Z91411 Personal history of adult psychological abuse: Secondary | ICD-10-CM | POA: Diagnosis not present

## 2017-07-25 DIAGNOSIS — Z87891 Personal history of nicotine dependence: Secondary | ICD-10-CM | POA: Diagnosis not present

## 2017-07-25 DIAGNOSIS — Z634 Disappearance and death of family member: Secondary | ICD-10-CM

## 2017-07-25 DIAGNOSIS — Z736 Limitation of activities due to disability: Secondary | ICD-10-CM

## 2017-07-25 DIAGNOSIS — R450 Nervousness: Secondary | ICD-10-CM

## 2017-07-25 DIAGNOSIS — R4584 Anhedonia: Secondary | ICD-10-CM

## 2017-07-25 DIAGNOSIS — Z818 Family history of other mental and behavioral disorders: Secondary | ICD-10-CM

## 2017-07-25 DIAGNOSIS — F419 Anxiety disorder, unspecified: Secondary | ICD-10-CM

## 2017-07-25 DIAGNOSIS — M549 Dorsalgia, unspecified: Secondary | ICD-10-CM

## 2017-07-25 DIAGNOSIS — F4010 Social phobia, unspecified: Secondary | ICD-10-CM

## 2017-07-25 DIAGNOSIS — R5381 Other malaise: Secondary | ICD-10-CM

## 2017-07-25 DIAGNOSIS — J449 Chronic obstructive pulmonary disease, unspecified: Secondary | ICD-10-CM

## 2017-07-25 DIAGNOSIS — Z8673 Personal history of transient ischemic attack (TIA), and cerebral infarction without residual deficits: Secondary | ICD-10-CM

## 2017-07-25 MED ORDER — MIRTAZAPINE 15 MG PO TABS: 15.0000 mg | ORAL_TABLET | Freq: Every day | ORAL | 2 refills | Status: DC

## 2017-07-25 MED ORDER — ALPRAZOLAM 1 MG PO TABS: 1.0000 mg | ORAL_TABLET | Freq: Three times a day (TID) | ORAL | 1 refills | Status: DC

## 2017-07-25 MED ORDER — VILAZODONE HCL 40 MG PO TABS: 40.0000 mg | ORAL_TABLET | Freq: Every day | ORAL | 2 refills | Status: DC
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
## 2017-07-25 NOTE — Telephone Encounter (Signed)
Someone from another practice came to office stating pt is downstairs and she is on O2 and the elevator is not working. Per pt she do not know what to do because she can't want up the stairs.

## 2017-08-23 ENCOUNTER — Encounter (HOSPITAL_COMMUNITY): Payer: Self-pay | Admitting: Psychiatry

## 2017-08-23 ENCOUNTER — Ambulatory Visit (INDEPENDENT_AMBULATORY_CARE_PROVIDER_SITE_OTHER): Payer: BLUE CROSS/BLUE SHIELD | Admitting: Psychiatry

## 2017-08-23 VITALS — BP 122/82 | HR 88 | Ht 63.0 in | Wt 102.0 lb

## 2017-08-23 DIAGNOSIS — Z736 Limitation of activities due to disability: Secondary | ICD-10-CM | POA: Diagnosis not present

## 2017-08-23 DIAGNOSIS — F419 Anxiety disorder, unspecified: Secondary | ICD-10-CM

## 2017-08-23 DIAGNOSIS — Z87891 Personal history of nicotine dependence: Secondary | ICD-10-CM

## 2017-08-23 DIAGNOSIS — J449 Chronic obstructive pulmonary disease, unspecified: Secondary | ICD-10-CM | POA: Diagnosis not present

## 2017-08-23 DIAGNOSIS — R450 Nervousness: Secondary | ICD-10-CM

## 2017-08-23 DIAGNOSIS — F322 Major depressive disorder, single episode, severe without psychotic features: Secondary | ICD-10-CM

## 2017-08-23 DIAGNOSIS — Z818 Family history of other mental and behavioral disorders: Secondary | ICD-10-CM

## 2017-08-23 MED ORDER — VILAZODONE HCL 40 MG PO TABS: 40.0000 mg | ORAL_TABLET | Freq: Every day | ORAL | 2 refills | Status: DC

## 2017-08-23 MED ORDER — MIRTAZAPINE 15 MG PO TABS: 15.0000 mg | ORAL_TABLET | Freq: Every day | ORAL | 2 refills | Status: DC
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
## 2017-09-13 ENCOUNTER — Other Ambulatory Visit: Payer: Self-pay | Admitting: Cardiovascular Disease

## 2017-09-19 ENCOUNTER — Encounter: Payer: Self-pay | Admitting: Cardiovascular Disease

## 2017-09-19 ENCOUNTER — Ambulatory Visit (INDEPENDENT_AMBULATORY_CARE_PROVIDER_SITE_OTHER): Payer: BLUE CROSS/BLUE SHIELD | Admitting: Cardiovascular Disease

## 2017-09-19 VITALS — BP 118/72 | HR 96 | Ht 61.0 in | Wt 103.0 lb

## 2017-09-19 DIAGNOSIS — I10 Essential (primary) hypertension: Secondary | ICD-10-CM | POA: Diagnosis not present

## 2017-09-19 DIAGNOSIS — R000 Tachycardia, unspecified: Secondary | ICD-10-CM

## 2017-09-19 DIAGNOSIS — J449 Chronic obstructive pulmonary disease, unspecified: Secondary | ICD-10-CM | POA: Diagnosis not present

## 2017-09-19 DIAGNOSIS — I5181 Takotsubo syndrome: Secondary | ICD-10-CM

## 2017-09-19 NOTE — Patient Instructions (Signed)

## 2017-09-19 NOTE — Progress Notes (Signed)
SUBJECTIVE: The patient presents for routine follow-up.  She has a history of tachycardia and malignant hypertension as well as Takotsubo cardiomyopathy/non-STEMI and has normal coronary arteries.  Echo on 07/22/13 showed normal LV systolic function, EF 50-55%.  She developed ankle and feet swelling with amlodipine 10 mg.  I then reduced it to 5 mg.  She has chronic hypoxemic respiratory failure and uses 3 L of oxygen at all times.  She denies chest pain.  Chronic exertional dyspnea is stable.  She seldom has palpitations.  She denies leg swelling.  She has not had any recent hospitalizations.  She follows with a pulmonologist in [place].    Review of Systems: As per "subjective", otherwise negative.  Allergies  Allergen Reactions  . Doxycycline Nausea Only  . Trintellix [Vortioxetine] Nausea Only and Other (See Comments)    constipation    Current Outpatient Medications  Medication Sig Dispense Refill  . albuterol (PROVENTIL HFA;VENTOLIN HFA) 108 (90 BASE) MCG/ACT inhaler Inhale 2 puffs into the lungs every 6 (six) hours as needed for wheezing or shortness of breath.    Marland Kitchen albuterol (PROVENTIL) (2.5 MG/3ML) 0.083% nebulizer solution Take 2.5 mg by nebulization every 6 (six) hours as needed for wheezing or shortness of breath.    . ALPRAZolam (XANAX) 1 MG tablet Take 1 tablet (1 mg total) by mouth 3 (three) times daily. 270 tablet 1  . amLODipine (NORVASC) 5 MG tablet TAKE 1 TABLET DAILY 90 tablet 1  . Azelastine HCl 0.15 % SOLN Place 0.15 application into the nose at bedtime.    . budesonide-formoterol (SYMBICORT) 160-4.5 MCG/ACT inhaler Inhale 2 puffs into the lungs 2 (two) times daily.    . cyanocobalamin (,VITAMIN B-12,) 1000 MCG/ML injection Inject 1,000 mcg into the muscle every 14 (fourteen) days.     Marland Kitchen denosumab (PROLIA) 60 MG/ML SOLN injection Inject 60 mg into the skin every 6 (six) months. Administer in upper arm, thigh, or abdomen    . dexlansoprazole (DEXILANT)  60 MG capsule Take 60 mg by mouth daily.    . fluticasone (FLONASE) 50 MCG/ACT nasal spray Place 1 spray into both nostrils daily.    . furosemide (LASIX) 20 MG tablet Take 1 tablet (20 mg total) by mouth as needed. 90 tablet 2  . gabapentin (NEURONTIN) 600 MG tablet Take 600 mg by mouth 3 (three) times daily.    . hydrALAZINE (APRESOLINE) 25 MG tablet Take 1 tablet (25 mg total) by mouth 3 (three) times daily. 270 tablet 3  . HYDROcodone-acetaminophen (NORCO/VICODIN) 5-325 MG tablet Take 1 tablet by mouth every 4 (four) hours as needed for moderate pain.    . IRON PO Take by mouth daily.    . metoprolol succinate (TOPROL XL) 25 MG 24 hr tablet Take 1 tablet (25 mg total) by mouth daily.    . mirtazapine (REMERON) 15 MG tablet Take 1 tablet (15 mg total) at bedtime by mouth. 30 tablet 2  . omega-3 acid ethyl esters (LOVAZA) 1 G capsule Take by mouth 2 (two) times daily.    . OXYGEN-HELIUM IN Inhale 3 L into the lungs.    . predniSONE (DELTASONE) 5 MG tablet Take 1 tablet by mouth daily.  0  . tiotropium (SPIRIVA) 18 MCG inhalation capsule Place 18 mcg into inhaler and inhale daily.    . Vilazodone HCl (VIIBRYD) 40 MG TABS Take 1 tablet (40 mg total) daily by mouth. 90 tablet 2   No current facility-administered medications for this visit.  Past Medical History:  Diagnosis Date  . Anxiety   . Back pain   . COPD (chronic obstructive pulmonary disease) (HCC)    Oxygen dependent  . Depression   . Fibromyalgia   . GERD (gastroesophageal reflux disease)   . HTN (hypertension), malignant 10/27/2014  . IBS (irritable bowel syndrome)   . Neck pain   . Pneumonia   . Takotsubo cardiomyopathy    a. NSTEMI 04/2013 secondary to Takotsubo's CM - normal cors, EF 30%.    Past Surgical History:  Procedure Laterality Date  . ABDOMINAL HYSTERECTOMY    . C-Secition    . CARDIAC CATHETERIZATION    . CHOLECYSTECTOMY    . LEFT HEART CATHETERIZATION WITH CORONARY ANGIOGRAM N/A 05/04/2013    Procedure: LEFT HEART CATHETERIZATION WITH CORONARY ANGIOGRAM;  Surgeon: Wendall StadePeter C Nishan, MD;  [facility];  Service: Cardiovascular;  Laterality: N/A;    Social History   Socioeconomic History  . Marital status: Legally Separated    Spouse name: Not on file  . Number of children: Not on file  . Years of education: Not on file  . Highest education level: Not on file  Social Needs  . Financial resource strain: Not on file  . Food insecurity - worry: Not on file  . Food insecurity - inability: Not on file  . Transportation needs - medical: Not on file  . Transportation needs - non-medical: Not on file  Occupational History  . Occupation: disabled  Tobacco Use  . Smoking status: Former Smoker    Packs/day: 0.50    Years: 20.00    Pack years: 10.00    Types: Cigarettes    Start date: 04/19/1993    Last attempt to quit: 04/19/2013    Years since quitting: 4.4  . Smokeless tobacco: Current User  . Tobacco comment: e-cigg occiasionally  Substance and Sexual Activity  . Alcohol use: No    Alcohol/week: 0.0 oz    Comment: 07-30-2016 per pt no but stopped in 2012  . Drug use: No    Comment: 07-30-2016 per pt no   . Sexual activity: Not Currently  Other Topics Concern  . Not on file  Social History Narrative  . Not on file     Vitals:   09/19/17 1322  BP: 118/72  Pulse: 96  SpO2: 98%  Weight: 103 lb (46.7 kg)  Height: 5\' 1"  (1.549 m)    Wt Readings from Last 3 Encounters:  09/19/17 103 lb (46.7 kg)  10/12/16 107 lb (48.5 kg)  08/10/15 107 lb (48.5 kg)     PHYSICAL EXAM General: NAD, using oxygen by nasal cannula HEENT: Normal. Neck: No JVD, no thyromegaly. Lungs: Diminished throughout, no crackles or wheezes. CV: Regular rate and rhythm, normal S1/S2, no S3/S4, no murmur. No pretibial or periankle edema. Abdomen: Soft, nontender, no distention.  Neurologic: Alert and oriented.  Psych: Normal affect. Skin: Normal. Musculoskeletal: No gross  deformities.    ECG: Most recent ECG reviewed.   Labs: Lab Results  Component Value Date/Time   K 3.1 (L) 05/06/2013 09:13 AM   BUN 10 05/06/2013 09:13 AM   CREATININE 0.65 05/06/2013 09:13 AM   ALT 18 05/02/2013 12:05 AM   TSH 0.376 05/02/2013 12:05 AM   HGB 11.3 (L) 05/05/2013 04:58 AM     Lipids: Lab Results  Component Value Date/Time   LDLCALC 69 05/03/2013 04:50 AM   CHOL 164 05/03/2013 04:50 AM   TRIG 77 05/03/2013 04:50 AM   HDL  80 05/03/2013 04:50 AM       ASSESSMENT AND PLAN:  1. Takotsubo cardiomyopathy: LV systolic function has since normalized. No further testing is warranted. Continue Toprol-XL.  2. COPD: Oxygen-dependent and stable.  Follows with pulmonology in [place].  3. Tachycardia: Controlled on Toprol-XL 25 mg daily. No changes.  4. Malignant HTN:  Controlled on amlodipine 5 mg, Toprol-XL 25 mg, and hydralazine 25 mg 3 times daily.  She did not tolerate 10 mg of amlodipine as it led to feet and leg swelling.  No changes to therapy.     Disposition: Follow up 1 year   Prentice DockerSuresh Gianfranco Araki, M.D., F.A.C.C.

## 2017-09-28 DIAGNOSIS — J441 Chronic obstructive pulmonary disease with (acute) exacerbation: Secondary | ICD-10-CM | POA: Diagnosis not present

## 2017-09-28 DIAGNOSIS — I10 Essential (primary) hypertension: Secondary | ICD-10-CM | POA: Diagnosis present

## 2017-09-28 DIAGNOSIS — J9610 Chronic respiratory failure, unspecified whether with hypoxia or hypercapnia: Secondary | ICD-10-CM | POA: Diagnosis not present

## 2017-09-28 DIAGNOSIS — F419 Anxiety disorder, unspecified: Secondary | ICD-10-CM | POA: Diagnosis present

## 2017-09-28 DIAGNOSIS — I252 Old myocardial infarction: Secondary | ICD-10-CM | POA: Diagnosis not present

## 2017-09-28 DIAGNOSIS — K219 Gastro-esophageal reflux disease without esophagitis: Secondary | ICD-10-CM | POA: Diagnosis present

## 2017-09-28 DIAGNOSIS — Z888 Allergy status to other drugs, medicaments and biological substances status: Secondary | ICD-10-CM | POA: Diagnosis not present

## 2017-09-28 DIAGNOSIS — J9621 Acute and chronic respiratory failure with hypoxia: Secondary | ICD-10-CM | POA: Diagnosis present

## 2017-09-28 DIAGNOSIS — M810 Age-related osteoporosis without current pathological fracture: Secondary | ICD-10-CM | POA: Diagnosis present

## 2017-09-28 DIAGNOSIS — J440 Chronic obstructive pulmonary disease with acute lower respiratory infection: Secondary | ICD-10-CM | POA: Diagnosis present

## 2017-09-28 DIAGNOSIS — Z87891 Personal history of nicotine dependence: Secondary | ICD-10-CM | POA: Diagnosis not present

## 2017-09-28 DIAGNOSIS — I2510 Atherosclerotic heart disease of native coronary artery without angina pectoris: Secondary | ICD-10-CM | POA: Diagnosis present

## 2017-09-28 DIAGNOSIS — M797 Fibromyalgia: Secondary | ICD-10-CM | POA: Diagnosis present

## 2017-09-28 DIAGNOSIS — F329 Major depressive disorder, single episode, unspecified: Secondary | ICD-10-CM | POA: Diagnosis present

## 2017-09-28 DIAGNOSIS — J189 Pneumonia, unspecified organism: Secondary | ICD-10-CM | POA: Diagnosis present

## 2017-09-28 DIAGNOSIS — Z79899 Other long term (current) drug therapy: Secondary | ICD-10-CM | POA: Diagnosis not present

## 2017-09-28 DIAGNOSIS — J9622 Acute and chronic respiratory failure with hypercapnia: Secondary | ICD-10-CM | POA: Diagnosis present

## 2017-09-28 DIAGNOSIS — Z9981 Dependence on supplemental oxygen: Secondary | ICD-10-CM | POA: Diagnosis not present

## 2017-10-07 DIAGNOSIS — J441 Chronic obstructive pulmonary disease with (acute) exacerbation: Secondary | ICD-10-CM | POA: Diagnosis not present

## 2017-10-07 DIAGNOSIS — J189 Pneumonia, unspecified organism: Secondary | ICD-10-CM | POA: Diagnosis not present

## 2017-10-07 DIAGNOSIS — J9621 Acute and chronic respiratory failure with hypoxia: Secondary | ICD-10-CM | POA: Diagnosis not present

## 2017-10-07 DIAGNOSIS — I10 Essential (primary) hypertension: Secondary | ICD-10-CM | POA: Diagnosis not present

## 2017-10-07 DIAGNOSIS — I2510 Atherosclerotic heart disease of native coronary artery without angina pectoris: Secondary | ICD-10-CM | POA: Diagnosis not present

## 2017-10-08 DIAGNOSIS — J189 Pneumonia, unspecified organism: Secondary | ICD-10-CM | POA: Diagnosis not present

## 2017-10-08 DIAGNOSIS — J441 Chronic obstructive pulmonary disease with (acute) exacerbation: Secondary | ICD-10-CM | POA: Diagnosis not present

## 2017-10-08 DIAGNOSIS — I10 Essential (primary) hypertension: Secondary | ICD-10-CM | POA: Diagnosis not present

## 2017-10-08 DIAGNOSIS — J9621 Acute and chronic respiratory failure with hypoxia: Secondary | ICD-10-CM | POA: Diagnosis not present

## 2017-10-08 DIAGNOSIS — I2510 Atherosclerotic heart disease of native coronary artery without angina pectoris: Secondary | ICD-10-CM | POA: Diagnosis not present

## 2017-10-09 DIAGNOSIS — J9621 Acute and chronic respiratory failure with hypoxia: Secondary | ICD-10-CM | POA: Diagnosis not present

## 2017-10-09 DIAGNOSIS — J189 Pneumonia, unspecified organism: Secondary | ICD-10-CM | POA: Diagnosis not present

## 2017-10-09 DIAGNOSIS — I2510 Atherosclerotic heart disease of native coronary artery without angina pectoris: Secondary | ICD-10-CM | POA: Diagnosis not present

## 2017-10-09 DIAGNOSIS — J441 Chronic obstructive pulmonary disease with (acute) exacerbation: Secondary | ICD-10-CM | POA: Diagnosis not present

## 2017-10-09 DIAGNOSIS — I10 Essential (primary) hypertension: Secondary | ICD-10-CM | POA: Diagnosis not present

## 2017-10-10 DIAGNOSIS — J189 Pneumonia, unspecified organism: Secondary | ICD-10-CM | POA: Diagnosis not present

## 2017-10-10 DIAGNOSIS — J441 Chronic obstructive pulmonary disease with (acute) exacerbation: Secondary | ICD-10-CM | POA: Diagnosis not present

## 2017-10-10 DIAGNOSIS — I2510 Atherosclerotic heart disease of native coronary artery without angina pectoris: Secondary | ICD-10-CM | POA: Diagnosis not present

## 2017-10-10 DIAGNOSIS — Z9911 Dependence on respirator [ventilator] status: Secondary | ICD-10-CM | POA: Diagnosis not present

## 2017-10-10 DIAGNOSIS — J9622 Acute and chronic respiratory failure with hypercapnia: Secondary | ICD-10-CM | POA: Diagnosis not present

## 2017-10-10 DIAGNOSIS — J9621 Acute and chronic respiratory failure with hypoxia: Secondary | ICD-10-CM | POA: Diagnosis not present

## 2017-10-10 DIAGNOSIS — I10 Essential (primary) hypertension: Secondary | ICD-10-CM | POA: Diagnosis not present

## 2017-10-11 DIAGNOSIS — J441 Chronic obstructive pulmonary disease with (acute) exacerbation: Secondary | ICD-10-CM | POA: Diagnosis not present

## 2017-10-11 DIAGNOSIS — J9621 Acute and chronic respiratory failure with hypoxia: Secondary | ICD-10-CM | POA: Diagnosis not present

## 2017-10-11 DIAGNOSIS — I10 Essential (primary) hypertension: Secondary | ICD-10-CM | POA: Diagnosis not present

## 2017-10-11 DIAGNOSIS — J9622 Acute and chronic respiratory failure with hypercapnia: Secondary | ICD-10-CM | POA: Diagnosis not present

## 2017-10-11 DIAGNOSIS — J189 Pneumonia, unspecified organism: Secondary | ICD-10-CM | POA: Diagnosis not present

## 2017-10-11 DIAGNOSIS — R262 Difficulty in walking, not elsewhere classified: Secondary | ICD-10-CM | POA: Diagnosis not present

## 2017-10-11 DIAGNOSIS — I2510 Atherosclerotic heart disease of native coronary artery without angina pectoris: Secondary | ICD-10-CM | POA: Diagnosis not present

## 2017-10-11 DIAGNOSIS — M6281 Muscle weakness (generalized): Secondary | ICD-10-CM | POA: Diagnosis not present

## 2017-10-11 DIAGNOSIS — R5381 Other malaise: Secondary | ICD-10-CM | POA: Diagnosis not present

## 2017-10-11 DIAGNOSIS — Z9911 Dependence on respirator [ventilator] status: Secondary | ICD-10-CM | POA: Diagnosis not present

## 2017-10-12 DIAGNOSIS — J9622 Acute and chronic respiratory failure with hypercapnia: Secondary | ICD-10-CM | POA: Diagnosis not present

## 2017-10-12 DIAGNOSIS — I10 Essential (primary) hypertension: Secondary | ICD-10-CM | POA: Diagnosis not present

## 2017-10-12 DIAGNOSIS — Z9911 Dependence on respirator [ventilator] status: Secondary | ICD-10-CM | POA: Diagnosis not present

## 2017-10-12 DIAGNOSIS — J189 Pneumonia, unspecified organism: Secondary | ICD-10-CM | POA: Diagnosis not present

## 2017-10-12 DIAGNOSIS — J9621 Acute and chronic respiratory failure with hypoxia: Secondary | ICD-10-CM | POA: Diagnosis not present

## 2017-10-12 DIAGNOSIS — I2510 Atherosclerotic heart disease of native coronary artery without angina pectoris: Secondary | ICD-10-CM | POA: Diagnosis not present

## 2017-10-12 DIAGNOSIS — J441 Chronic obstructive pulmonary disease with (acute) exacerbation: Secondary | ICD-10-CM | POA: Diagnosis not present

## 2017-10-13 DIAGNOSIS — I2510 Atherosclerotic heart disease of native coronary artery without angina pectoris: Secondary | ICD-10-CM | POA: Diagnosis not present

## 2017-10-13 DIAGNOSIS — J9621 Acute and chronic respiratory failure with hypoxia: Secondary | ICD-10-CM | POA: Diagnosis not present

## 2017-10-13 DIAGNOSIS — J9622 Acute and chronic respiratory failure with hypercapnia: Secondary | ICD-10-CM | POA: Diagnosis not present

## 2017-10-13 DIAGNOSIS — J189 Pneumonia, unspecified organism: Secondary | ICD-10-CM | POA: Diagnosis not present

## 2017-10-13 DIAGNOSIS — Z9911 Dependence on respirator [ventilator] status: Secondary | ICD-10-CM | POA: Diagnosis not present

## 2017-10-13 DIAGNOSIS — J441 Chronic obstructive pulmonary disease with (acute) exacerbation: Secondary | ICD-10-CM | POA: Diagnosis not present

## 2017-10-13 DIAGNOSIS — I10 Essential (primary) hypertension: Secondary | ICD-10-CM | POA: Diagnosis not present

## 2017-10-14 DIAGNOSIS — R262 Difficulty in walking, not elsewhere classified: Secondary | ICD-10-CM | POA: Diagnosis not present

## 2017-10-14 DIAGNOSIS — M6281 Muscle weakness (generalized): Secondary | ICD-10-CM | POA: Diagnosis not present

## 2017-10-14 DIAGNOSIS — R5381 Other malaise: Secondary | ICD-10-CM | POA: Diagnosis not present

## 2017-10-14 DIAGNOSIS — J189 Pneumonia, unspecified organism: Secondary | ICD-10-CM | POA: Diagnosis not present

## 2017-10-14 DIAGNOSIS — J9621 Acute and chronic respiratory failure with hypoxia: Secondary | ICD-10-CM | POA: Diagnosis not present

## 2017-10-14 DIAGNOSIS — J441 Chronic obstructive pulmonary disease with (acute) exacerbation: Secondary | ICD-10-CM | POA: Diagnosis not present

## 2017-10-15 DIAGNOSIS — J9621 Acute and chronic respiratory failure with hypoxia: Secondary | ICD-10-CM | POA: Diagnosis not present

## 2017-10-15 DIAGNOSIS — J441 Chronic obstructive pulmonary disease with (acute) exacerbation: Secondary | ICD-10-CM | POA: Diagnosis not present

## 2017-10-15 DIAGNOSIS — J189 Pneumonia, unspecified organism: Secondary | ICD-10-CM | POA: Diagnosis not present

## 2017-10-15 DIAGNOSIS — R5381 Other malaise: Secondary | ICD-10-CM | POA: Diagnosis not present

## 2017-10-16 DIAGNOSIS — R5381 Other malaise: Secondary | ICD-10-CM | POA: Diagnosis not present

## 2017-10-16 DIAGNOSIS — J441 Chronic obstructive pulmonary disease with (acute) exacerbation: Secondary | ICD-10-CM | POA: Diagnosis not present

## 2017-10-16 DIAGNOSIS — J189 Pneumonia, unspecified organism: Secondary | ICD-10-CM | POA: Diagnosis not present

## 2017-10-16 DIAGNOSIS — J9621 Acute and chronic respiratory failure with hypoxia: Secondary | ICD-10-CM | POA: Diagnosis not present

## 2017-10-17 DIAGNOSIS — R5381 Other malaise: Secondary | ICD-10-CM | POA: Diagnosis not present

## 2017-10-17 DIAGNOSIS — J9621 Acute and chronic respiratory failure with hypoxia: Secondary | ICD-10-CM | POA: Diagnosis not present

## 2017-10-17 DIAGNOSIS — J189 Pneumonia, unspecified organism: Secondary | ICD-10-CM | POA: Diagnosis not present

## 2017-10-17 DIAGNOSIS — J441 Chronic obstructive pulmonary disease with (acute) exacerbation: Secondary | ICD-10-CM | POA: Diagnosis not present

## 2017-10-18 ENCOUNTER — Ambulatory Visit: Payer: Self-pay | Admitting: Cardiovascular Disease

## 2017-10-18 DIAGNOSIS — J189 Pneumonia, unspecified organism: Secondary | ICD-10-CM | POA: Diagnosis not present

## 2017-10-18 DIAGNOSIS — J441 Chronic obstructive pulmonary disease with (acute) exacerbation: Secondary | ICD-10-CM | POA: Diagnosis not present

## 2017-10-18 DIAGNOSIS — R5381 Other malaise: Secondary | ICD-10-CM | POA: Diagnosis not present

## 2017-10-18 DIAGNOSIS — J9621 Acute and chronic respiratory failure with hypoxia: Secondary | ICD-10-CM | POA: Diagnosis not present

## 2017-10-23 ENCOUNTER — Ambulatory Visit (INDEPENDENT_AMBULATORY_CARE_PROVIDER_SITE_OTHER): Payer: BLUE CROSS/BLUE SHIELD | Admitting: Psychiatry

## 2017-10-23 ENCOUNTER — Encounter (HOSPITAL_COMMUNITY): Payer: Self-pay | Admitting: Psychiatry

## 2017-10-23 VITALS — BP 150/80 | HR 105 | Ht 61.0 in | Wt 102.0 lb

## 2017-10-23 DIAGNOSIS — Z736 Limitation of activities due to disability: Secondary | ICD-10-CM | POA: Diagnosis not present

## 2017-10-23 DIAGNOSIS — J449 Chronic obstructive pulmonary disease, unspecified: Secondary | ICD-10-CM | POA: Diagnosis not present

## 2017-10-23 DIAGNOSIS — Z87891 Personal history of nicotine dependence: Secondary | ICD-10-CM

## 2017-10-23 DIAGNOSIS — F322 Major depressive disorder, single episode, severe without psychotic features: Secondary | ICD-10-CM | POA: Diagnosis not present

## 2017-10-23 DIAGNOSIS — Z818 Family history of other mental and behavioral disorders: Secondary | ICD-10-CM

## 2017-10-23 MED ORDER — VILAZODONE HCL 40 MG PO TABS: 40.0000 mg | ORAL_TABLET | Freq: Every day | ORAL | 2 refills | Status: DC

## 2017-10-23 MED ORDER — MIRTAZAPINE 7.5 MG PO TABS: 7.5000 mg | ORAL_TABLET | Freq: Every day | ORAL | 2 refills | Status: DC

## 2017-10-23 MED ORDER — ALPRAZOLAM 1 MG PO TABS: 1.0000 mg | ORAL_TABLET | Freq: Three times a day (TID) | ORAL | 1 refills | Status: DC
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
## 2017-10-24 DIAGNOSIS — J449 Chronic obstructive pulmonary disease, unspecified: Secondary | ICD-10-CM | POA: Diagnosis not present

## 2017-10-24 DIAGNOSIS — Z87891 Personal history of nicotine dependence: Secondary | ICD-10-CM | POA: Diagnosis not present

## 2017-10-24 DIAGNOSIS — Z681 Body mass index (BMI) 19 or less, adult: Secondary | ICD-10-CM | POA: Diagnosis not present

## 2017-10-24 DIAGNOSIS — I10 Essential (primary) hypertension: Secondary | ICD-10-CM | POA: Diagnosis not present

## 2017-10-24 DIAGNOSIS — Z299 Encounter for prophylactic measures, unspecified: Secondary | ICD-10-CM | POA: Diagnosis not present

## 2017-10-24 DIAGNOSIS — I2510 Atherosclerotic heart disease of native coronary artery without angina pectoris: Secondary | ICD-10-CM | POA: Diagnosis not present

## 2017-10-24 DIAGNOSIS — G250 Essential tremor: Secondary | ICD-10-CM | POA: Diagnosis not present

## 2017-10-30 DIAGNOSIS — Z87891 Personal history of nicotine dependence: Secondary | ICD-10-CM | POA: Diagnosis not present

## 2017-10-30 DIAGNOSIS — M549 Dorsalgia, unspecified: Secondary | ICD-10-CM | POA: Diagnosis not present

## 2017-10-30 DIAGNOSIS — F329 Major depressive disorder, single episode, unspecified: Secondary | ICD-10-CM | POA: Diagnosis not present

## 2017-10-30 DIAGNOSIS — I10 Essential (primary) hypertension: Secondary | ICD-10-CM | POA: Diagnosis not present

## 2017-10-30 DIAGNOSIS — J449 Chronic obstructive pulmonary disease, unspecified: Secondary | ICD-10-CM | POA: Diagnosis not present

## 2017-10-30 DIAGNOSIS — Z299 Encounter for prophylactic measures, unspecified: Secondary | ICD-10-CM | POA: Diagnosis not present

## 2017-10-30 DIAGNOSIS — Z681 Body mass index (BMI) 19 or less, adult: Secondary | ICD-10-CM | POA: Diagnosis not present

## 2017-10-30 DIAGNOSIS — J029 Acute pharyngitis, unspecified: Secondary | ICD-10-CM | POA: Diagnosis not present

## 2017-10-31 DIAGNOSIS — G894 Chronic pain syndrome: Secondary | ICD-10-CM | POA: Diagnosis not present

## 2017-10-31 DIAGNOSIS — M542 Cervicalgia: Secondary | ICD-10-CM | POA: Diagnosis not present

## 2017-10-31 DIAGNOSIS — M461 Sacroiliitis, not elsewhere classified: Secondary | ICD-10-CM | POA: Diagnosis not present

## 2017-10-31 DIAGNOSIS — M545 Low back pain: Secondary | ICD-10-CM | POA: Diagnosis not present

## 2017-12-04 ENCOUNTER — Telehealth (HOSPITAL_COMMUNITY): Payer: Self-pay | Admitting: *Deleted

## 2017-12-04 ENCOUNTER — Other Ambulatory Visit (HOSPITAL_COMMUNITY): Payer: Self-pay | Admitting: Psychiatry

## 2017-12-04 MED ORDER — FLUOXETINE HCL 40 MG PO CAPS: 40.0000 mg | ORAL_CAPSULE | Freq: Every day | ORAL | 2 refills | Status: DC

## 2017-12-04 NOTE — Telephone Encounter (Signed)
prozac 40 mg sent in 

## 2017-12-04 NOTE — Telephone Encounter (Signed)
Dr Tenny Crawoss Patient called in stating that the Vybrid  1) cost to much & 2) doesn't seem to be working well. Requesting to go back on Prozac ( which works the best ) or Cymbalta. Next appointment is 01/22/18  asking for a 30 day supply until next appointment

## 2017-12-25 DIAGNOSIS — J309 Allergic rhinitis, unspecified: Secondary | ICD-10-CM | POA: Diagnosis not present

## 2017-12-25 DIAGNOSIS — J441 Chronic obstructive pulmonary disease with (acute) exacerbation: Secondary | ICD-10-CM | POA: Diagnosis not present

## 2017-12-25 DIAGNOSIS — J9611 Chronic respiratory failure with hypoxia: Secondary | ICD-10-CM | POA: Diagnosis not present

## 2017-12-26 DIAGNOSIS — Z888 Allergy status to other drugs, medicaments and biological substances status: Secondary | ICD-10-CM | POA: Diagnosis not present

## 2017-12-26 DIAGNOSIS — I10 Essential (primary) hypertension: Secondary | ICD-10-CM | POA: Diagnosis present

## 2017-12-26 DIAGNOSIS — M797 Fibromyalgia: Secondary | ICD-10-CM | POA: Diagnosis present

## 2017-12-26 DIAGNOSIS — F329 Major depressive disorder, single episode, unspecified: Secondary | ICD-10-CM | POA: Diagnosis present

## 2017-12-26 DIAGNOSIS — Z87891 Personal history of nicotine dependence: Secondary | ICD-10-CM | POA: Diagnosis not present

## 2017-12-26 DIAGNOSIS — J441 Chronic obstructive pulmonary disease with (acute) exacerbation: Secondary | ICD-10-CM | POA: Diagnosis present

## 2017-12-26 DIAGNOSIS — Z9981 Dependence on supplemental oxygen: Secondary | ICD-10-CM | POA: Diagnosis not present

## 2017-12-26 DIAGNOSIS — Z90710 Acquired absence of both cervix and uterus: Secondary | ICD-10-CM | POA: Diagnosis not present

## 2017-12-26 DIAGNOSIS — M549 Dorsalgia, unspecified: Secondary | ICD-10-CM | POA: Diagnosis present

## 2017-12-26 DIAGNOSIS — J9621 Acute and chronic respiratory failure with hypoxia: Secondary | ICD-10-CM | POA: Diagnosis present

## 2017-12-26 DIAGNOSIS — R0602 Shortness of breath: Secondary | ICD-10-CM | POA: Diagnosis not present

## 2017-12-26 DIAGNOSIS — E872 Acidosis: Secondary | ICD-10-CM | POA: Diagnosis present

## 2017-12-26 DIAGNOSIS — Z9889 Other specified postprocedural states: Secondary | ICD-10-CM | POA: Diagnosis not present

## 2017-12-26 DIAGNOSIS — I5181 Takotsubo syndrome: Secondary | ICD-10-CM | POA: Diagnosis present

## 2017-12-26 DIAGNOSIS — Z9049 Acquired absence of other specified parts of digestive tract: Secondary | ICD-10-CM | POA: Diagnosis not present

## 2017-12-26 DIAGNOSIS — J9622 Acute and chronic respiratory failure with hypercapnia: Secondary | ICD-10-CM | POA: Diagnosis present

## 2017-12-26 DIAGNOSIS — K589 Irritable bowel syndrome without diarrhea: Secondary | ICD-10-CM | POA: Diagnosis present

## 2018-01-22 ENCOUNTER — Encounter (HOSPITAL_COMMUNITY): Payer: Self-pay | Admitting: Psychiatry

## 2018-01-22 ENCOUNTER — Ambulatory Visit (INDEPENDENT_AMBULATORY_CARE_PROVIDER_SITE_OTHER): Payer: BLUE CROSS/BLUE SHIELD | Admitting: Psychiatry

## 2018-01-22 VITALS — BP 139/89 | HR 87 | Ht 61.0 in | Wt 103.0 lb

## 2018-01-22 DIAGNOSIS — J449 Chronic obstructive pulmonary disease, unspecified: Secondary | ICD-10-CM | POA: Diagnosis not present

## 2018-01-22 DIAGNOSIS — F322 Major depressive disorder, single episode, severe without psychotic features: Secondary | ICD-10-CM

## 2018-01-22 DIAGNOSIS — R0602 Shortness of breath: Secondary | ICD-10-CM | POA: Diagnosis not present

## 2018-01-22 DIAGNOSIS — Z87891 Personal history of nicotine dependence: Secondary | ICD-10-CM

## 2018-01-22 DIAGNOSIS — F419 Anxiety disorder, unspecified: Secondary | ICD-10-CM | POA: Diagnosis not present

## 2018-01-22 DIAGNOSIS — Z736 Limitation of activities due to disability: Secondary | ICD-10-CM

## 2018-01-22 DIAGNOSIS — M549 Dorsalgia, unspecified: Secondary | ICD-10-CM

## 2018-01-22 DIAGNOSIS — Z818 Family history of other mental and behavioral disorders: Secondary | ICD-10-CM | POA: Diagnosis not present

## 2018-01-22 DIAGNOSIS — R450 Nervousness: Secondary | ICD-10-CM

## 2018-01-22 MED ORDER — MIRTAZAPINE 7.5 MG PO TABS: 7.5000 mg | ORAL_TABLET | Freq: Every day | ORAL | 2 refills | Status: DC

## 2018-01-22 MED ORDER — ALPRAZOLAM 1 MG PO TABS: 1.0000 mg | ORAL_TABLET | Freq: Three times a day (TID) | ORAL | 1 refills | Status: DC

## 2018-01-22 MED ORDER — VILAZODONE HCL 40 MG PO TABS: 40.0000 mg | ORAL_TABLET | Freq: Every day | ORAL | 2 refills | Status: DC
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
## 2018-02-18 ENCOUNTER — Other Ambulatory Visit: Payer: Self-pay | Admitting: Cardiovascular Disease

## 2018-03-06 ENCOUNTER — Ambulatory Visit (INDEPENDENT_AMBULATORY_CARE_PROVIDER_SITE_OTHER): Payer: BLUE CROSS/BLUE SHIELD | Admitting: Psychiatry

## 2018-03-06 ENCOUNTER — Encounter (HOSPITAL_COMMUNITY): Payer: Self-pay | Admitting: Psychiatry

## 2018-03-06 VITALS — BP 154/92 | HR 97 | Ht 61.0 in | Wt 107.0 lb

## 2018-03-06 DIAGNOSIS — M2550 Pain in unspecified joint: Secondary | ICD-10-CM | POA: Diagnosis not present

## 2018-03-06 DIAGNOSIS — Z87891 Personal history of nicotine dependence: Secondary | ICD-10-CM | POA: Diagnosis not present

## 2018-03-06 DIAGNOSIS — J449 Chronic obstructive pulmonary disease, unspecified: Secondary | ICD-10-CM

## 2018-03-06 DIAGNOSIS — F322 Major depressive disorder, single episode, severe without psychotic features: Secondary | ICD-10-CM

## 2018-03-06 DIAGNOSIS — M549 Dorsalgia, unspecified: Secondary | ICD-10-CM

## 2018-03-06 DIAGNOSIS — R450 Nervousness: Secondary | ICD-10-CM

## 2018-03-06 DIAGNOSIS — F419 Anxiety disorder, unspecified: Secondary | ICD-10-CM

## 2018-03-06 DIAGNOSIS — F329 Major depressive disorder, single episode, unspecified: Secondary | ICD-10-CM | POA: Diagnosis not present

## 2018-03-06 DIAGNOSIS — Z736 Limitation of activities due to disability: Secondary | ICD-10-CM

## 2018-03-06 DIAGNOSIS — R0602 Shortness of breath: Secondary | ICD-10-CM

## 2018-03-06 DIAGNOSIS — Z6229 Other upbringing away from parents: Secondary | ICD-10-CM

## 2018-03-06 DIAGNOSIS — Z818 Family history of other mental and behavioral disorders: Secondary | ICD-10-CM | POA: Diagnosis not present

## 2018-03-06 MED ORDER — ALPRAZOLAM 1 MG PO TABS: 1.0000 mg | ORAL_TABLET | Freq: Three times a day (TID) | ORAL | 1 refills | Status: DC

## 2018-03-06 MED ORDER — VILAZODONE HCL 40 MG PO TABS: 40.0000 mg | ORAL_TABLET | Freq: Every day | ORAL | 2 refills | Status: DC

## 2018-03-06 MED ORDER — MIRTAZAPINE 7.5 MG PO TABS: 7.5000 mg | ORAL_TABLET | Freq: Every day | ORAL | 2 refills | Status: DC
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
## 2018-04-08 ENCOUNTER — Other Ambulatory Visit: Payer: Self-pay | Admitting: Cardiovascular Disease

## 2018-05-07 ENCOUNTER — Ambulatory Visit (INDEPENDENT_AMBULATORY_CARE_PROVIDER_SITE_OTHER): Payer: BLUE CROSS/BLUE SHIELD | Admitting: Psychiatry

## 2018-05-07 ENCOUNTER — Encounter (HOSPITAL_COMMUNITY): Payer: Self-pay | Admitting: Psychiatry

## 2018-05-07 VITALS — BP 143/85 | HR 94 | Ht 61.0 in | Wt 109.0 lb

## 2018-05-07 DIAGNOSIS — F322 Major depressive disorder, single episode, severe without psychotic features: Secondary | ICD-10-CM | POA: Diagnosis not present

## 2018-05-07 MED ORDER — ALPRAZOLAM 1 MG PO TABS: 1.0000 mg | ORAL_TABLET | Freq: Three times a day (TID) | ORAL | 1 refills | Status: DC

## 2018-05-07 MED ORDER — MIRTAZAPINE 15 MG PO TABS: 15.0000 mg | ORAL_TABLET | Freq: Every day | ORAL | 2 refills | Status: DC

## 2018-05-07 MED ORDER — VILAZODONE HCL 40 MG PO TABS: 40.0000 mg | ORAL_TABLET | Freq: Every day | ORAL | 2 refills | Status: DC
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
## 2018-05-08 ENCOUNTER — Other Ambulatory Visit: Payer: Self-pay | Admitting: Cardiovascular Disease

## 2018-05-08 MED ORDER — METOPROLOL SUCCINATE ER 25 MG PO TB24: 25.0000 mg | ORAL_TABLET | Freq: Every day | ORAL | 3 refills | Status: DC

## 2018-05-08 NOTE — Telephone Encounter (Signed)
° °  1. Which medications need to be refilled? (please list name of each medication and dose if known)     metoprolol succinate (TOPROL XL) 25 MG 24    2. Which pharmacy/location (including street and city if local pharmacy) is medication to be sent to?  [facility]   30 DAY SUPPLY

## 2018-05-08 NOTE — Telephone Encounter (Signed)
rx sent

## 2018-06-16 ENCOUNTER — Other Ambulatory Visit: Payer: Self-pay | Admitting: *Deleted

## 2018-06-16 MED ORDER — METOPROLOL SUCCINATE ER 25 MG PO TB24: 25.0000 mg | ORAL_TABLET | Freq: Every day | ORAL | 1 refills | Status: DC

## 2018-06-18 ENCOUNTER — Encounter (HOSPITAL_COMMUNITY): Payer: Self-pay | Admitting: Psychiatry

## 2018-06-18 ENCOUNTER — Ambulatory Visit (INDEPENDENT_AMBULATORY_CARE_PROVIDER_SITE_OTHER): Payer: Medicare Other | Admitting: Psychiatry

## 2018-06-18 VITALS — BP 155/82 | HR 79 | Ht 61.0 in | Wt 107.0 lb

## 2018-06-18 DIAGNOSIS — Z818 Family history of other mental and behavioral disorders: Secondary | ICD-10-CM | POA: Diagnosis not present

## 2018-06-18 DIAGNOSIS — G4700 Insomnia, unspecified: Secondary | ICD-10-CM

## 2018-06-18 DIAGNOSIS — Z87891 Personal history of nicotine dependence: Secondary | ICD-10-CM

## 2018-06-18 DIAGNOSIS — Z736 Limitation of activities due to disability: Secondary | ICD-10-CM | POA: Diagnosis not present

## 2018-06-18 DIAGNOSIS — F322 Major depressive disorder, single episode, severe without psychotic features: Secondary | ICD-10-CM

## 2018-06-18 DIAGNOSIS — J449 Chronic obstructive pulmonary disease, unspecified: Secondary | ICD-10-CM | POA: Diagnosis not present

## 2018-06-18 MED ORDER — VILAZODONE HCL 40 MG PO TABS: 40.0000 mg | ORAL_TABLET | Freq: Every day | ORAL | 2 refills | Status: DC

## 2018-06-18 MED ORDER — MIRTAZAPINE 15 MG PO TABS: 15.0000 mg | ORAL_TABLET | Freq: Every day | ORAL | 2 refills | Status: DC

## 2018-06-18 MED ORDER — ALPRAZOLAM 1 MG PO TABS: 1.0000 mg | ORAL_TABLET | Freq: Three times a day (TID) | ORAL | 1 refills | Status: DC
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
## 2018-08-11 DIAGNOSIS — R945 Abnormal results of liver function studies: Secondary | ICD-10-CM | POA: Diagnosis not present

## 2018-08-11 DIAGNOSIS — K760 Fatty (change of) liver, not elsewhere classified: Secondary | ICD-10-CM | POA: Diagnosis not present

## 2018-08-12 DIAGNOSIS — M545 Low back pain: Secondary | ICD-10-CM | POA: Diagnosis not present

## 2018-08-12 DIAGNOSIS — M4854XG Collapsed vertebra, not elsewhere classified, thoracic region, subsequent encounter for fracture with delayed healing: Secondary | ICD-10-CM | POA: Diagnosis not present

## 2018-08-12 DIAGNOSIS — G8929 Other chronic pain: Secondary | ICD-10-CM | POA: Diagnosis not present

## 2018-08-12 DIAGNOSIS — M4856XA Collapsed vertebra, not elsewhere classified, lumbar region, initial encounter for fracture: Secondary | ICD-10-CM | POA: Diagnosis not present

## 2018-08-18 ENCOUNTER — Ambulatory Visit (HOSPITAL_COMMUNITY): Payer: Self-pay | Admitting: Psychiatry

## 2018-08-18 DIAGNOSIS — M810 Age-related osteoporosis without current pathological fracture: Secondary | ICD-10-CM | POA: Diagnosis not present

## 2018-08-23 DIAGNOSIS — M4856XA Collapsed vertebra, not elsewhere classified, lumbar region, initial encounter for fracture: Secondary | ICD-10-CM | POA: Diagnosis not present

## 2018-08-25 DIAGNOSIS — G8929 Other chronic pain: Secondary | ICD-10-CM | POA: Diagnosis not present

## 2018-08-25 DIAGNOSIS — M4856XA Collapsed vertebra, not elsewhere classified, lumbar region, initial encounter for fracture: Secondary | ICD-10-CM | POA: Diagnosis not present

## 2018-08-25 DIAGNOSIS — M545 Low back pain: Secondary | ICD-10-CM | POA: Diagnosis not present

## 2018-08-25 DIAGNOSIS — M4854XG Collapsed vertebra, not elsewhere classified, thoracic region, subsequent encounter for fracture with delayed healing: Secondary | ICD-10-CM | POA: Diagnosis not present

## 2018-08-25 DIAGNOSIS — Z01818 Encounter for other preprocedural examination: Secondary | ICD-10-CM | POA: Diagnosis not present

## 2018-08-26 DIAGNOSIS — Z01818 Encounter for other preprocedural examination: Secondary | ICD-10-CM | POA: Diagnosis not present

## 2018-08-27 DIAGNOSIS — M4856XG Collapsed vertebra, not elsewhere classified, lumbar region, subsequent encounter for fracture with delayed healing: Secondary | ICD-10-CM | POA: Diagnosis not present

## 2018-08-28 ENCOUNTER — Inpatient Hospital Stay (HOSPITAL_COMMUNITY): Payer: Medicare Other

## 2018-08-28 ENCOUNTER — Inpatient Hospital Stay (HOSPITAL_COMMUNITY)
Admission: AD | Admit: 2018-08-28 | Discharge: 2018-09-07 | DRG: 207 | Disposition: A | Discharge status: Home Health | Payer: Medicare Other | Source: Other Acute Inpatient Hospital | Attending: Internal Medicine | Admitting: Internal Medicine

## 2018-08-28 DIAGNOSIS — J9621 Acute and chronic respiratory failure with hypoxia: Secondary | ICD-10-CM | POA: Diagnosis present

## 2018-08-28 DIAGNOSIS — G9341 Metabolic encephalopathy: Secondary | ICD-10-CM | POA: Diagnosis present

## 2018-08-28 DIAGNOSIS — J9811 Atelectasis: Secondary | ICD-10-CM | POA: Diagnosis not present

## 2018-08-28 DIAGNOSIS — J9600 Acute respiratory failure, unspecified whether with hypoxia or hypercapnia: Secondary | ICD-10-CM | POA: Diagnosis not present

## 2018-08-28 DIAGNOSIS — I10 Essential (primary) hypertension: Secondary | ICD-10-CM | POA: Diagnosis present

## 2018-08-28 DIAGNOSIS — Z7951 Long term (current) use of inhaled steroids: Secondary | ICD-10-CM | POA: Diagnosis not present

## 2018-08-28 DIAGNOSIS — I959 Hypotension, unspecified: Secondary | ICD-10-CM | POA: Diagnosis not present

## 2018-08-28 DIAGNOSIS — I252 Old myocardial infarction: Secondary | ICD-10-CM | POA: Diagnosis not present

## 2018-08-28 DIAGNOSIS — Z9981 Dependence on supplemental oxygen: Secondary | ICD-10-CM | POA: Diagnosis not present

## 2018-08-28 DIAGNOSIS — Z9289 Personal history of other medical treatment: Secondary | ICD-10-CM

## 2018-08-28 DIAGNOSIS — J439 Emphysema, unspecified: Secondary | ICD-10-CM | POA: Diagnosis not present

## 2018-08-28 DIAGNOSIS — Z23 Encounter for immunization: Secondary | ICD-10-CM | POA: Diagnosis not present

## 2018-08-28 DIAGNOSIS — Z7901 Long term (current) use of anticoagulants: Secondary | ICD-10-CM | POA: Diagnosis not present

## 2018-08-28 DIAGNOSIS — G9340 Encephalopathy, unspecified: Secondary | ICD-10-CM

## 2018-08-28 DIAGNOSIS — D72829 Elevated white blood cell count, unspecified: Secondary | ICD-10-CM | POA: Diagnosis not present

## 2018-08-28 DIAGNOSIS — Z7952 Long term (current) use of systemic steroids: Secondary | ICD-10-CM | POA: Diagnosis not present

## 2018-08-28 DIAGNOSIS — Z4682 Encounter for fitting and adjustment of non-vascular catheter: Secondary | ICD-10-CM | POA: Diagnosis not present

## 2018-08-28 DIAGNOSIS — J9601 Acute respiratory failure with hypoxia: Secondary | ICD-10-CM | POA: Diagnosis not present

## 2018-08-28 DIAGNOSIS — Z818 Family history of other mental and behavioral disorders: Secondary | ICD-10-CM

## 2018-08-28 DIAGNOSIS — F329 Major depressive disorder, single episode, unspecified: Secondary | ICD-10-CM | POA: Diagnosis not present

## 2018-08-28 DIAGNOSIS — Z978 Presence of other specified devices: Secondary | ICD-10-CM | POA: Diagnosis not present

## 2018-08-28 DIAGNOSIS — Z87898 Personal history of other specified conditions: Secondary | ICD-10-CM | POA: Diagnosis not present

## 2018-08-28 DIAGNOSIS — R251 Tremor, unspecified: Secondary | ICD-10-CM

## 2018-08-28 DIAGNOSIS — Z79899 Other long term (current) drug therapy: Secondary | ICD-10-CM | POA: Diagnosis not present

## 2018-08-28 DIAGNOSIS — R739 Hyperglycemia, unspecified: Secondary | ICD-10-CM | POA: Diagnosis not present

## 2018-08-28 DIAGNOSIS — J90 Pleural effusion, not elsewhere classified: Secondary | ICD-10-CM | POA: Diagnosis not present

## 2018-08-28 DIAGNOSIS — R5081 Fever presenting with conditions classified elsewhere: Secondary | ICD-10-CM | POA: Diagnosis not present

## 2018-08-28 DIAGNOSIS — J441 Chronic obstructive pulmonary disease with (acute) exacerbation: Principal | ICD-10-CM | POA: Diagnosis present

## 2018-08-28 DIAGNOSIS — J80 Acute respiratory distress syndrome: Secondary | ICD-10-CM | POA: Diagnosis not present

## 2018-08-28 DIAGNOSIS — R000 Tachycardia, unspecified: Secondary | ICD-10-CM | POA: Diagnosis not present

## 2018-08-28 DIAGNOSIS — E872 Acidosis: Secondary | ICD-10-CM | POA: Diagnosis present

## 2018-08-28 DIAGNOSIS — T380X5A Adverse effect of glucocorticoids and synthetic analogues, initial encounter: Secondary | ICD-10-CM | POA: Diagnosis not present

## 2018-08-28 DIAGNOSIS — Z4659 Encounter for fitting and adjustment of other gastrointestinal appliance and device: Secondary | ICD-10-CM

## 2018-08-28 DIAGNOSIS — J449 Chronic obstructive pulmonary disease, unspecified: Secondary | ICD-10-CM | POA: Diagnosis not present

## 2018-08-28 DIAGNOSIS — K219 Gastro-esophageal reflux disease without esophagitis: Secondary | ICD-10-CM | POA: Diagnosis not present

## 2018-08-28 DIAGNOSIS — Z90710 Acquired absence of both cervix and uterus: Secondary | ICD-10-CM | POA: Diagnosis not present

## 2018-08-28 DIAGNOSIS — R4020 Unspecified coma: Secondary | ICD-10-CM | POA: Diagnosis not present

## 2018-08-28 DIAGNOSIS — L8990 Pressure ulcer of unspecified site, unspecified stage: Secondary | ICD-10-CM

## 2018-08-28 DIAGNOSIS — Z9889 Other specified postprocedural states: Secondary | ICD-10-CM

## 2018-08-28 DIAGNOSIS — J9690 Respiratory failure, unspecified, unspecified whether with hypoxia or hypercapnia: Secondary | ICD-10-CM

## 2018-08-28 DIAGNOSIS — M797 Fibromyalgia: Secondary | ICD-10-CM | POA: Diagnosis not present

## 2018-08-28 DIAGNOSIS — D638 Anemia in other chronic diseases classified elsewhere: Secondary | ICD-10-CM | POA: Diagnosis present

## 2018-08-28 DIAGNOSIS — R4182 Altered mental status, unspecified: Secondary | ICD-10-CM | POA: Diagnosis not present

## 2018-08-28 DIAGNOSIS — R509 Fever, unspecified: Secondary | ICD-10-CM

## 2018-08-28 DIAGNOSIS — E876 Hypokalemia: Secondary | ICD-10-CM | POA: Diagnosis present

## 2018-08-28 DIAGNOSIS — Z87891 Personal history of nicotine dependence: Secondary | ICD-10-CM

## 2018-08-28 DIAGNOSIS — F419 Anxiety disorder, unspecified: Secondary | ICD-10-CM | POA: Diagnosis present

## 2018-08-28 DIAGNOSIS — R918 Other nonspecific abnormal finding of lung field: Secondary | ICD-10-CM | POA: Diagnosis not present

## 2018-08-28 DIAGNOSIS — G40509 Epileptic seizures related to external causes, not intractable, without status epilepticus: Secondary | ICD-10-CM | POA: Diagnosis not present

## 2018-08-28 DIAGNOSIS — J9602 Acute respiratory failure with hypercapnia: Secondary | ICD-10-CM

## 2018-08-28 DIAGNOSIS — J95850 Mechanical complication of respirator: Secondary | ICD-10-CM

## 2018-08-28 DIAGNOSIS — Z888 Allergy status to other drugs, medicaments and biological substances status: Secondary | ICD-10-CM | POA: Diagnosis not present

## 2018-08-28 DIAGNOSIS — R404 Transient alteration of awareness: Secondary | ICD-10-CM | POA: Diagnosis not present

## 2018-08-28 DIAGNOSIS — J9622 Acute and chronic respiratory failure with hypercapnia: Secondary | ICD-10-CM | POA: Diagnosis present

## 2018-08-28 DIAGNOSIS — R402421 Glasgow coma scale score 9-12, in the field [EMT or ambulance]: Secondary | ICD-10-CM | POA: Diagnosis not present

## 2018-08-28 DIAGNOSIS — R0902 Hypoxemia: Secondary | ICD-10-CM | POA: Diagnosis not present

## 2018-08-28 LAB — POCT I-STAT 3, ART BLOOD GAS (G3+)
ACID-BASE EXCESS: 1 mmol/L (ref 0.0–2.0)
Bicarbonate: 29.1 mmol/L — ABNORMAL HIGH (ref 20.0–28.0)
O2 Saturation: 89 %
PH ART: 7.218 — AB (ref 7.350–7.450)
TCO2: 31 mmol/L (ref 22–32)
pCO2 arterial: 73.1 mmHg (ref 32.0–48.0)
pO2, Arterial: 77 mmHg — ABNORMAL LOW (ref 83.0–108.0)

## 2018-08-28 MED ORDER — IPRATROPIUM-ALBUTEROL 0.5-2.5 (3) MG/3ML IN SOLN
3.0000 mL | Freq: Four times a day (QID) | RESPIRATORY_TRACT | Status: DC
  Administered 2018-08-29 (×3): 3 mL via RESPIRATORY_TRACT
  Filled 2018-08-28 (×3): qty 3

## 2018-08-28 MED ORDER — CHLORHEXIDINE GLUCONATE 0.12% ORAL RINSE (MEDLINE KIT)
15.0000 mL | Freq: Two times a day (BID) | OROMUCOSAL | Status: DC
  Administered 2018-08-28 – 2018-09-03 (×11): 15 mL via OROMUCOSAL

## 2018-08-28 MED ORDER — MIDAZOLAM HCL 2 MG/2ML IJ SOLN: 1.0000 mg | INTRAMUSCULAR | Status: DC | PRN

## 2018-08-28 MED ORDER — POTASSIUM CHLORIDE 20 MEQ/15ML (10%) PO SOLN
40.0000 meq | Freq: Once | ORAL | Status: AC
  Administered 2018-08-29: 40 meq
  Filled 2018-08-28: qty 30

## 2018-08-28 MED ORDER — FENTANYL CITRATE (PF) 100 MCG/2ML IJ SOLN: 25.0000 ug | INTRAMUSCULAR | Status: DC | PRN

## 2018-08-28 MED ORDER — MIDAZOLAM HCL 2 MG/2ML IJ SOLN
2.0000 mg | Freq: Once | INTRAMUSCULAR | Status: AC
  Administered 2018-08-29: 2 mg via INTRAVENOUS
  Filled 2018-08-28: qty 2

## 2018-08-28 MED ORDER — SODIUM CHLORIDE 0.9 % IV SOLN
INTRAVENOUS | Status: DC
  Administered 2018-08-28 – 2018-09-05 (×4): via INTRAVENOUS

## 2018-08-28 MED ORDER — CALCIUM GLUCONATE-NACL 1-0.675 GM/50ML-% IV SOLN
1.0000 g | Freq: Once | INTRAVENOUS | Status: AC
  Administered 2018-08-29: 1000 mg via INTRAVENOUS
  Filled 2018-08-28: qty 50

## 2018-08-28 MED ORDER — HEPARIN SODIUM (PORCINE) 5000 UNIT/ML IJ SOLN
5000.0000 [IU] | Freq: Three times a day (TID) | INTRAMUSCULAR | Status: DC
  Administered 2018-08-29 – 2018-09-07 (×25): 5000 [IU] via SUBCUTANEOUS
  Filled 2018-08-28 (×26): qty 1

## 2018-08-28 MED ORDER — FENTANYL 2500MCG IN NS 250ML (10MCG/ML) PREMIX INFUSION
0.0000 ug/h | INTRAVENOUS | Status: DC
  Administered 2018-08-29: 25 ug/h via INTRAVENOUS
  Administered 2018-08-29: 200 ug/h via INTRAVENOUS
  Administered 2018-08-30: 300 ug/h via INTRAVENOUS
  Administered 2018-08-31: 100 ug/h via INTRAVENOUS
  Administered 2018-09-01 (×2): 200 ug/h via INTRAVENOUS
  Administered 2018-09-02: 100 ug/h via INTRAVENOUS
  Administered 2018-09-03: 200 ug/h via INTRAVENOUS
  Filled 2018-08-28 (×8): qty 250

## 2018-08-28 MED ORDER — FENTANYL CITRATE (PF) 100 MCG/2ML IJ SOLN: 0.0000 ug | INTRAMUSCULAR | Status: DC | PRN

## 2018-08-28 MED ORDER — PROPOFOL 1000 MG/100ML IV EMUL
5.0000 ug/kg/min | INTRAVENOUS | Status: DC
  Administered 2018-08-29: 40 ug/kg/min via INTRAVENOUS
  Filled 2018-08-28: qty 100

## 2018-08-28 MED ORDER — BUDESONIDE 0.5 MG/2ML IN SUSP
0.5000 mg | Freq: Two times a day (BID) | RESPIRATORY_TRACT | Status: DC
  Administered 2018-08-29 – 2018-09-07 (×19): 0.5 mg via RESPIRATORY_TRACT
  Filled 2018-08-28 (×20): qty 2

## 2018-08-28 MED ORDER — ORAL CARE MOUTH RINSE
15.0000 mL | OROMUCOSAL | Status: DC
  Administered 2018-08-29 – 2018-09-03 (×57): 15 mL via OROMUCOSAL

## 2018-08-28 MED ORDER — ARFORMOTEROL TARTRATE 15 MCG/2ML IN NEBU
15.0000 ug | INHALATION_SOLUTION | Freq: Two times a day (BID) | RESPIRATORY_TRACT | Status: DC
  Administered 2018-08-29 – 2018-09-07 (×19): 15 ug via RESPIRATORY_TRACT
  Filled 2018-08-28 (×20): qty 2

## 2018-08-28 NOTE — Progress Notes (Signed)
ABG drawn, abnormal results given to NP.  Changes made and will recheck ABG.

## 2018-08-28 NOTE — H&P (Signed)
NAME:  Sarah Chan, MRN:  [id number], DOB:  Apr 10, 1966, LOS: 0 ADMISSION DATE:  08/28/2018, CONSULTATION DATE:  08/29/18 REFERRING MD:  Lanier Prude  CHIEF COMPLAINT:  Respiratory Failure   Brief History   Sarah Chan is a 52 y.o. female who was transferred from [facility] with acute on chronic hypercapnic and hypoxemic respiratory failure requiring mechanical ventilation.  Had just had lumbar kyphoplasty 1 day prior.  History of present illness   Sarah Chan is a 52 y.o. female who has a PMH of COPD on chronic O2, GERD, HTN, fibromyalgia, anxiety, depression, back pain s/p lumbar kyphoplasty 08/27/18.  She was taken to [facility] 11/21 after she was found partially unresponsive on the floor of her home.  She had a lumbar kyphoplasty the day prior.  She apparently had been prescribed 270 tables of 1mg  xanax at the beginning of October.  She was given narcan by EMS and had minimal response.  In the ED, she was found to have severe hypercapnic and hypoxemic respiratory failure (7.17 / 120 / 52) and required intubation due to AMS and inability to protect the airway.  UDS was positive for benzos, otherwise negative.  She was later transferred to [facility] for further evaluation and management.  Past Medical History  COPD, GERD, HTN, fibromyalgia, anxiety, depression.  Significant Hospital Events   11/21 > presented to [facility], intubated and transferred to [facility].  Consults:  PCCM.  Procedures:  ETT 11/21 >   Significant Diagnostic Tests:  CXR 11/21 > emphysema.  Micro Data:  Blood 11/21 >  Sputum 11/21 >  Urine 11/21 >   Antimicrobials:  None.   Interim history/subjective:  Comfortable on vent.  Objective:  There were no vitals taken for this visit.       No intake or output data in the 24 hours ending 08/28/18 2324 There were no vitals filed for this visit.  Examination: General: Adult female, in NAD. Neuro: Sedated, not responsive.  Withdraws to noxious stimuli. HEENT:  [place]/AT. Sclerae anicteric.  ETT in place. Cardiovascular: RRR, no M/R/G.  Lungs: Respirations even and unlabored.  CTA bilaterally, No W/R/R.  Abdomen: BS x 4, soft, NT/ND.  Musculoskeletal: No gross deformities, no edema.  Skin: Intact, warm, no rashes.  Assessment & Plan:   Acute on chronic hypercapnic and hypoxemic respiratory failure - s/p intubation. - Retract ETT by 2cm based off initial CXR (order has been placed). - Full vent support, rate increased to 22 and FiO2 60% based off initial ABG (7.21 / 73 / 77 on settings 8cc/kg, rate 16, FiO2 50%, PEEP 5). - Repeat ABG at 0130. - Bronchial hygiene. - Follow CXR.  AMS - presumed due to above.  UDS positive for benzos. - Assess EtOH for completeness (former EtOH dependence per psych note from 06/18/18, quite in 2012). - Suspect that mental status will improve as hypercapnia improves (and sedation is lightened).  If not, will need to evaluate for other metabolic etiologies.  Hx COPD. - Budesonide / Brovana, Duonebs.  Hypokalemia (labs from [facility] with K 3.0). - 40 mEq K per tube. - F/u BMP in AM.  Hypocalcemia - corrects to 7.74. - 1g Ca gluconate.  Anemia (Labs from [facility] with Hgb 9.3). - Transfuse for Hgb < 7.  Leukocytosis (Labs from [facility] with WBC 12.6) - presumed acute phase reactant.  No evidence of infection. - Monitor clinically. - Pan culture for completeness. - Assess PCT x 1.  Back pain - s/p lumbar kyphoplasty 11/20. -  F/u as outpatient.  Hx fibromyalgia, depression, anxiety. - Hold preadmission xanax, vibryd, mirtazapine (these meds based on psych note from 06/18/18).  Best Practice:  Diet: NPO. Pain/Anxiety/Delirium protocol (if indicated): Propofol gtt / Fentanyl gtt / Midazolam PRN. VAP protocol (if indicated): In place. DVT prophylaxis: SCD's / Heparin. GI prophylaxis: Pantoprazole. Glucose control: N/A. Mobility: Bedrest. Code Status: Full. Family Communication: None available. Disposition:  ICU.  Labs   CBC: No results for input(s): WBC, NEUTROABS, HGB, HCT, MCV, PLT in the last 168 hours. Basic Metabolic Panel: No results for input(s): NA, K, CL, CO2, GLUCOSE, BUN, CREATININE, CALCIUM, MG, PHOS in the last 168 hours. GFR: CrCl cannot be calculated (Patient's most recent lab result is older than the maximum 21 days allowed.). No results for input(s): PROCALCITON, WBC, LATICACIDVEN in the last 168 hours. Liver Function Tests: No results for input(s): AST, ALT, ALKPHOS, BILITOT, PROT, ALBUMIN in the last 168 hours. No results for input(s): LIPASE, AMYLASE in the last 168 hours. No results for input(s): AMMONIA in the last 168 hours. ABG No results found for: PHART, PCO2ART, PO2ART, HCO3, TCO2, ACIDBASEDEF, O2SAT  Coagulation Profile: No results for input(s): INR, PROTIME in the last 168 hours. Cardiac Enzymes: No results for input(s): CKTOTAL, CKMB, CKMBINDEX, TROPONINI in the last 168 hours. HbA1C: Hgb A1c MFr Bld  Date/Time Value Ref Range Status  05/02/2013 12:05 AM 5.5 <5.7 % Final    Comment:    (NOTE)                                                                       According to the ADA Clinical Practice Recommendations for 2011, when HbA1c is used as a screening test:  >=6.5%   Diagnostic of Diabetes Mellitus           (if abnormal result is confirmed) 5.7-6.4%   Increased risk of developing Diabetes Mellitus References:Diagnosis and Classification of Diabetes Mellitus,Diabetes Care,2011,34(Suppl 1):S62-S69 and Standards of Medical Care in         Diabetes - 2011,Diabetes Care,2011,34 (Suppl 1):S11-S61.   CBG: No results for input(s): GLUCAP in the last 168 hours.  Review of Systems:   Unable to obtain as pt is encephalopathic.  Past medical history  She,  has a past medical history of Anxiety, Back pain, COPD (chronic obstructive pulmonary disease) (HCC), Depression, Fibromyalgia, GERD (gastroesophageal reflux disease), HTN (hypertension), malignant  (10/27/2014), IBS (irritable bowel syndrome), Neck pain, Pneumonia, and Takotsubo cardiomyopathy.   Surgical History    Past Surgical History:  Procedure Laterality Date  . ABDOMINAL HYSTERECTOMY    . C-Secition    . CARDIAC CATHETERIZATION    . CHOLECYSTECTOMY    . LEFT HEART CATHETERIZATION WITH CORONARY ANGIOGRAM N/A 05/04/2013   Procedure: LEFT HEART CATHETERIZATION WITH CORONARY ANGIOGRAM;  Surgeon: Wendall Stade, MD;  Location: [facility] CATH LAB;  Service: Cardiovascular;  Laterality: N/A;     Social History   reports that she quit smoking about 5 years ago. Her smoking use included cigarettes. She started smoking about 25 years ago. She has a 10.00 pack-year smoking history. She uses smokeless tobacco. She reports that she does not drink alcohol or use drugs.   Family history   Her family history includes Depression in her  maternal grandfather; Schizophrenia in her maternal uncle.   Allergies Allergies  Allergen Reactions  . Doxycycline Nausea Only  . Trintellix [Vortioxetine] Nausea Only and Other (See Comments)    constipation     Home meds  Prior to Admission medications   Medication Sig Start Date End Date Taking? Authorizing Provider  albuterol (PROVENTIL HFA;VENTOLIN HFA) 108 (90 BASE) MCG/ACT inhaler Inhale 2 puffs into the lungs every 6 (six) hours as needed for wheezing or shortness of breath.    [provider]  albuterol (PROVENTIL) (2.5 MG/3ML) 0.083% nebulizer solution Take 2.5 mg by nebulization every 6 (six) hours as needed for wheezing or shortness of breath.    [provider]  ALPRAZolam Prudy Feeler) 1 MG tablet Take 1 tablet (1 mg total) by mouth 3 (three) times daily. 06/18/18 06/18/19  Myrlene Broker, MD  amLODipine (NORVASC) 5 MG tablet TAKE 1 TABLET DAILY 02/18/18   Laqueta Linden, MD  Azelastine HCl 0.15 % SOLN Place 0.15 application into the nose at bedtime.    [provider]  budesonide-formoterol (SYMBICORT) 160-4.5 MCG/ACT  inhaler Inhale 2 puffs into the lungs 2 (two) times daily.    [provider]  cyanocobalamin (,VITAMIN B-12,) 1000 MCG/ML injection Inject 1,000 mcg into the muscle every 14 (fourteen) days.     [provider]  denosumab (PROLIA) 60 MG/ML SOLN injection Inject 60 mg into the skin every 6 (six) months. Administer in upper arm, thigh, or abdomen    [provider]  dexlansoprazole (DEXILANT) 60 MG capsule Take 60 mg by mouth daily.    [provider]  fluticasone (FLONASE) 50 MCG/ACT nasal spray Place 1 spray into both nostrils daily.    [provider]  furosemide (LASIX) 20 MG tablet Take 1 tablet (20 mg total) by mouth as needed. 05/30/17   Laqueta Linden, MD  gabapentin (NEURONTIN) 600 MG tablet Take 600 mg by mouth 3 (three) times daily.    [provider]  hydrALAZINE (APRESOLINE) 25 MG tablet TAKE 1 TABLET THREE TIMES A DAY 04/08/18   Laqueta Linden, MD  HYDROcodone-acetaminophen (NORCO/VICODIN) 5-325 MG tablet Take 1 tablet by mouth every 4 (four) hours as needed for moderate pain.    [provider]  IRON PO Take by mouth daily.    [provider]  meloxicam (MOBIC) 15 MG tablet Take 15 mg by mouth daily. 04/21/18   [provider]  metoprolol succinate (TOPROL XL) 25 MG 24 hr tablet Take 1 tablet (25 mg total) by mouth daily. 06/16/18   Laqueta Linden, MD  mirtazapine (REMERON) 15 MG tablet Take 1 tablet (15 mg total) by mouth at bedtime. 06/18/18 06/18/19  Myrlene Broker, MD  omega-3 acid ethyl esters (LOVAZA) 1 G capsule Take by mouth 2 (two) times daily.    [provider]  OXYGEN-HELIUM IN Inhale 3 L into the lungs.    [provider]  predniSONE (DELTASONE) 5 MG tablet Take 1 tablet by mouth daily. 07/28/15   [provider]  tiotropium (SPIRIVA) 18 MCG inhalation capsule Place 18 mcg into inhaler and inhale daily.    [provider]  Vilazodone HCl (VIIBRYD)  40 MG TABS Take 1 tablet (40 mg total) by mouth daily. 06/18/18   Myrlene Broker, MD    Critical care time: 35 min.    Rutherford Guys, [place] [facility] Pager: [phone]  or [phone]  08/28/2018, 11:24 PM

## 2018-08-28 NOTE — Progress Notes (Signed)
RT attempted to collect Sputum culture unable to.  Will try again .

## 2018-08-29 ENCOUNTER — Inpatient Hospital Stay (HOSPITAL_COMMUNITY): Payer: Medicare Other

## 2018-08-29 DIAGNOSIS — Z978 Presence of other specified devices: Secondary | ICD-10-CM

## 2018-08-29 DIAGNOSIS — J9602 Acute respiratory failure with hypercapnia: Secondary | ICD-10-CM

## 2018-08-29 DIAGNOSIS — Z87898 Personal history of other specified conditions: Secondary | ICD-10-CM

## 2018-08-29 DIAGNOSIS — Z9889 Other specified postprocedural states: Secondary | ICD-10-CM

## 2018-08-29 LAB — BASIC METABOLIC PANEL
Anion gap: 6 (ref 5–15)
Anion gap: 5 (ref 5–15)
BUN: <5 mg/dL — ABNORMAL LOW (ref 6–20)
BUN: 6 mg/dL (ref 6–20)
CO2: 29 mmol/L (ref 22–32)
CO2: 28 mmol/L (ref 22–32)
CREATININE: 0.61 mg/dL (ref 0.44–1.00)
Calcium: 6.6 mg/dL — ABNORMAL LOW (ref 8.9–10.3)
Calcium: 6.6 mg/dL — ABNORMAL LOW (ref 8.9–10.3)
Chloride: 103 mmol/L (ref 98–111)
Chloride: 106 mmol/L (ref 98–111)
Creatinine, Ser: 0.56 mg/dL (ref 0.44–1.00)
GFR calc Af Amer: >60 mL/min (ref >60)
GFR calc non Af Amer: >60 mL/min (ref >60)
Glucose, Bld: 80 mg/dL (ref 70–99)
Glucose, Bld: 165 mg/dL — ABNORMAL HIGH (ref 70–99)
POTASSIUM: 4.3 mmol/L (ref 3.5–5.1)
Potassium: 2.8 mmol/L — ABNORMAL LOW (ref 3.5–5.1)
SODIUM: 139 mmol/L (ref 135–145)
Sodium: 138 mmol/L (ref 135–145)

## 2018-08-29 LAB — POCT I-STAT 3, ART BLOOD GAS (G3+)
ACID-BASE EXCESS: 1 mmol/L (ref 0.0–2.0)
ACID-BASE EXCESS: 1 mmol/L (ref 0.0–2.0)
ACID-BASE EXCESS: 1 mmol/L (ref 0.0–2.0)
Acid-Base Excess: 1 mmol/L (ref 0.0–2.0)
Acid-base deficit: 2 mmol/L (ref 0.0–2.0)
Acid-base deficit: 1 mmol/L (ref 0.0–2.0)
BICARBONATE: 27.2 mmol/L (ref 20.0–28.0)
BICARBONATE: 28.5 mmol/L — AB (ref 20.0–28.0)
Bicarbonate: 30.9 mmol/L — ABNORMAL HIGH (ref 20.0–28.0)
Bicarbonate: 29.8 mmol/L — ABNORMAL HIGH (ref 20.0–28.0)
Bicarbonate: 29.7 mmol/L — ABNORMAL HIGH (ref 20.0–28.0)
Bicarbonate: 29.9 mmol/L — ABNORMAL HIGH (ref 20.0–28.0)
O2 SAT: 94 %
O2 SAT: 92 %
O2 SAT: 83 %
O2 Saturation: 93 %
O2 Saturation: 82 %
O2 Saturation: 81 %
PCO2 ART: 81.3 mmHg — AB (ref 32.0–48.0)
PCO2 ART: 75.5 mmHg — AB (ref 32.0–48.0)
PCO2 ART: 73.7 mmHg — AB (ref 32.0–48.0)
PH ART: 7.191 — AB (ref 7.350–7.450)
PO2 ART: 93 mmHg (ref 83.0–108.0)
PO2 ART: 88 mmHg (ref 83.0–108.0)
PO2 ART: 59 mmHg — AB (ref 83.0–108.0)
Patient temperature: 99.6
Patient temperature: 99.1
Patient temperature: 99.4
Patient temperature: 99.7
Patient temperature: 98.8
TCO2: 33 mmol/L — AB (ref 22–32)
TCO2: 32 mmol/L (ref 22–32)
TCO2: 29 mmol/L (ref 22–32)
TCO2: 32 mmol/L (ref 22–32)
TCO2: 32 mmol/L (ref 22–32)
TCO2: 31 mmol/L (ref 22–32)
pCO2 arterial: 74.4 mmHg (ref 32.0–48.0)
pCO2 arterial: 74.1 mmHg (ref 32.0–48.0)
pCO2 arterial: 79.9 mmHg (ref 32.0–48.0)
pH, Arterial: 7.205 — ABNORMAL LOW (ref 7.350–7.450)
pH, Arterial: 7.174 — CL (ref 7.350–7.450)
pH, Arterial: 7.215 — ABNORMAL LOW (ref 7.350–7.450)
pH, Arterial: 7.217 — ABNORMAL LOW (ref 7.350–7.450)
pH, Arterial: 7.164 — CL (ref 7.350–7.450)
pO2, Arterial: 81 mmHg — ABNORMAL LOW (ref 83.0–108.0)
pO2, Arterial: 59 mmHg — ABNORMAL LOW (ref 83.0–108.0)
pO2, Arterial: 61 mmHg — ABNORMAL LOW (ref 83.0–108.0)

## 2018-08-29 LAB — CBC
HEMATOCRIT: 33.1 % — AB (ref 36.0–46.0)
Hemoglobin: 9.2 g/dL — ABNORMAL LOW (ref 12.0–15.0)
MCH: 27.2 pg (ref 26.0–34.0)
MCHC: 27.8 g/dL — ABNORMAL LOW (ref 30.0–36.0)
MCV: 97.9 fL (ref 80.0–100.0)
NRBC: 0 % (ref 0.0–0.2)
PLATELETS: 153 10*3/uL (ref 150–400)
RBC: 3.38 MIL/uL — AB (ref 3.87–5.11)
RDW: 12.9 % (ref 11.5–15.5)
WBC: 11.7 10*3/uL — AB (ref 4.0–10.5)

## 2018-08-29 LAB — GLUCOSE, CAPILLARY
Glucose-Capillary: 158 mg/dL — ABNORMAL HIGH (ref 70–99)
Glucose-Capillary: 56 mg/dL — ABNORMAL LOW (ref 70–99)
Glucose-Capillary: 102 mg/dL — ABNORMAL HIGH (ref 70–99)
Glucose-Capillary: 113 mg/dL — ABNORMAL HIGH (ref 70–99)

## 2018-08-29 LAB — ETHANOL: Alcohol, Ethyl (B): <10 mg/dL (ref <10)

## 2018-08-29 LAB — PROCALCITONIN: Procalcitonin: 0.76 ng/mL

## 2018-08-29 LAB — PHOSPHORUS
PHOSPHORUS: 3.3 mg/dL (ref 2.5–4.6)
PHOSPHORUS: 1.2 mg/dL — AB (ref 2.5–4.6)

## 2018-08-29 LAB — MAGNESIUM: Magnesium: 1.3 mg/dL — ABNORMAL LOW (ref 1.7–2.4)

## 2018-08-29 LAB — MRSA PCR SCREENING: MRSA BY PCR: NEGATIVE

## 2018-08-29 LAB — INFLUENZA PANEL BY PCR (TYPE A & B)
INFLAPCR: NEGATIVE
Influenza B By PCR: NEGATIVE

## 2018-08-29 LAB — TRIGLYCERIDES: Triglycerides: 182 mg/dL — ABNORMAL HIGH (ref <150)

## 2018-08-29 MED ORDER — POTASSIUM PHOSPHATES 15 MMOLE/5ML IV SOLN
30.0000 mmol | Freq: Once | INTRAVENOUS | Status: AC
  Administered 2018-08-29: 30 mmol via INTRAVENOUS
  Filled 2018-08-29: qty 10

## 2018-08-29 MED ORDER — MIRTAZAPINE 15 MG PO TABS
15.0000 mg | ORAL_TABLET | Freq: Every day | ORAL | Status: DC
  Administered 2018-08-29 – 2018-09-06 (×9): 15 mg via ORAL
  Filled 2018-08-29 (×9): qty 1

## 2018-08-29 MED ORDER — SODIUM CHLORIDE 0.9 % IV SOLN
1.0000 g | INTRAVENOUS | Status: DC
  Filled 2018-08-29: qty 10

## 2018-08-29 MED ORDER — BISACODYL 10 MG RE SUPP: 10.0000 mg | Freq: Every day | RECTAL | Status: DC | PRN

## 2018-08-29 MED ORDER — METHYLPREDNISOLONE SODIUM SUCC 40 MG IJ SOLR
40.0000 mg | Freq: Two times a day (BID) | INTRAMUSCULAR | Status: DC
  Administered 2018-08-29: 40 mg via INTRAVENOUS
  Filled 2018-08-29: qty 1

## 2018-08-29 MED ORDER — METHYLPREDNISOLONE SODIUM SUCC 40 MG IJ SOLR
40.0000 mg | Freq: Once | INTRAMUSCULAR | Status: AC
  Administered 2018-08-29: 40 mg via INTRAVENOUS
  Filled 2018-08-29: qty 1

## 2018-08-29 MED ORDER — IPRATROPIUM-ALBUTEROL 0.5-2.5 (3) MG/3ML IN SOLN
3.0000 mL | RESPIRATORY_TRACT | Status: DC
  Administered 2018-08-29 – 2018-09-07 (×52): 3 mL via RESPIRATORY_TRACT
  Filled 2018-08-29 (×56): qty 3

## 2018-08-29 MED ORDER — FENTANYL CITRATE (PF) 100 MCG/2ML IJ SOLN
100.0000 ug | Freq: Once | INTRAMUSCULAR | Status: AC
  Administered 2018-08-29: 100 ug via INTRAVENOUS

## 2018-08-29 MED ORDER — VILAZODONE HCL 10 MG PO TABS
40.0000 mg | ORAL_TABLET | Freq: Every day | ORAL | Status: DC
  Administered 2018-08-29: 40 mg via ORAL
  Filled 2018-08-29 (×2): qty 4

## 2018-08-29 MED ORDER — VANCOMYCIN HCL IN DEXTROSE 1-5 GM/200ML-% IV SOLN: 1000.0000 mg | Freq: Once | INTRAVENOUS | Status: DC

## 2018-08-29 MED ORDER — SODIUM CHLORIDE 0.9 % IV SOLN
3.0000 g | Freq: Four times a day (QID) | INTRAVENOUS | Status: AC
  Administered 2018-08-29 – 2018-09-02 (×19): 3 g via INTRAVENOUS
  Filled 2018-08-29 (×19): qty 3

## 2018-08-29 MED ORDER — SODIUM CHLORIDE 0.9 % IV SOLN: INTRAVENOUS | Status: DC | PRN

## 2018-08-29 MED ORDER — MIDAZOLAM HCL 2 MG/2ML IJ SOLN
2.0000 mg | INTRAMUSCULAR | Status: AC | PRN
  Administered 2018-08-29 (×3): 2 mg via INTRAVENOUS
  Filled 2018-08-29 (×3): qty 2

## 2018-08-29 MED ORDER — ACETAMINOPHEN 325 MG PO TABS
650.0000 mg | ORAL_TABLET | Freq: Four times a day (QID) | ORAL | Status: DC | PRN
  Administered 2018-09-01 – 2018-09-06 (×5): 650 mg
  Filled 2018-08-29 (×6): qty 2

## 2018-08-29 MED ORDER — VANCOMYCIN HCL 500 MG IV SOLR: 500.0000 mg | Freq: Two times a day (BID) | INTRAVENOUS | Status: DC

## 2018-08-29 MED ORDER — MIDAZOLAM HCL 2 MG/2ML IJ SOLN
2.0000 mg | INTRAMUSCULAR | Status: DC | PRN
  Administered 2018-08-29 (×2): 2 mg via INTRAVENOUS
  Administered 2018-08-30: 1 mg via INTRAVENOUS
  Administered 2018-08-30 (×4): 2 mg via INTRAVENOUS
  Administered 2018-08-30: 1 mg via INTRAVENOUS
  Administered 2018-08-30 – 2018-09-02 (×23): 2 mg via INTRAVENOUS
  Filled 2018-08-29 (×23): qty 2

## 2018-08-29 MED ORDER — SODIUM CHLORIDE 0.9 % IV SOLN
500.0000 mg | INTRAVENOUS | Status: DC
  Filled 2018-08-29: qty 500

## 2018-08-29 MED ORDER — ALBUTEROL SULFATE (2.5 MG/3ML) 0.083% IN NEBU: 2.5000 mg | INHALATION_SOLUTION | RESPIRATORY_TRACT | Status: DC | PRN

## 2018-08-29 MED ORDER — MAGNESIUM SULFATE 4 GM/100ML IV SOLN
4.0000 g | Freq: Once | INTRAVENOUS | Status: AC
  Administered 2018-08-29: 4 g via INTRAVENOUS
  Filled 2018-08-29: qty 100

## 2018-08-29 MED ORDER — METHYLPREDNISOLONE SODIUM SUCC 125 MG IJ SOLR
80.0000 mg | Freq: Two times a day (BID) | INTRAMUSCULAR | Status: DC
  Administered 2018-08-29 – 2018-09-05 (×14): 80 mg via INTRAVENOUS
  Filled 2018-08-29 (×14): qty 2

## 2018-08-29 MED ORDER — DEXTROSE 50 % IV SOLN
INTRAVENOUS | Status: AC
  Administered 2018-08-29: 50 mL
  Filled 2018-08-29: qty 50

## 2018-08-29 MED ORDER — PANTOPRAZOLE SODIUM 40 MG PO PACK
40.0000 mg | PACK | Freq: Every day | ORAL | Status: DC
  Administered 2018-08-29 – 2018-09-03 (×6): 40 mg
  Filled 2018-08-29 (×6): qty 20

## 2018-08-29 MED ORDER — DEXMEDETOMIDINE HCL IN NACL 200 MCG/50ML IV SOLN
0.4000 ug/kg/h | INTRAVENOUS | Status: DC
  Administered 2018-08-29: 0.4 ug/kg/h via INTRAVENOUS
  Filled 2018-08-29: qty 50

## 2018-08-29 MED ORDER — PIPERACILLIN-TAZOBACTAM 3.375 G IVPB: 3.3750 g | Freq: Three times a day (TID) | INTRAVENOUS | Status: DC

## 2018-08-29 MED ORDER — SENNOSIDES 8.8 MG/5ML PO SYRP
5.0000 mL | ORAL_SOLUTION | Freq: Two times a day (BID) | ORAL | Status: DC | PRN
  Filled 2018-08-29: qty 5

## 2018-08-29 MED ORDER — ALPRAZOLAM 0.5 MG PO TABS
0.5000 mg | ORAL_TABLET | Freq: Three times a day (TID) | ORAL | Status: DC | PRN
  Administered 2018-08-30 – 2018-09-07 (×18): 0.5 mg via ORAL
  Filled 2018-08-29 (×18): qty 1

## 2018-08-29 NOTE — Progress Notes (Signed)
RR increased to 30 per MD order. 

## 2018-08-29 NOTE — Progress Notes (Signed)
Hypoglycemic Event  CBG: 56   Treatment: D50 IV 50 mL  Symptoms: None  Follow-up CBG: Time:0735 CBG Result: 158  Possible Reasons for Event: Unknown    Sarah PorterSelina Y Teia Freitas

## 2018-08-29 NOTE — Progress Notes (Signed)
Initial Nutrition Assessment  DOCUMENTATION CODES:   Not applicable  INTERVENTION:   If unable to extubate within 24 hours, recommend addition of enteral nutrition  Tube Feeding:  Vital AF 1.2 @ 45 ml/hr Provides 81 g of protein, 1296 kcals and 875 mL of free water Meets 100% protein and 98% calorie needs   NUTRITION DIAGNOSIS:   Inadequate oral intake related to acute illness as evidenced by NPO status.  GOAL:   Patient will meet greater than or equal to 90% of their needs  MONITOR:   Vent status, Labs  REASON FOR ASSESSMENT:   Ventilator    ASSESSMENT:   52 [facility] acute on chronic respiratory failure post lumbar kyphoplasty. PMH includes COPD on home O2, GERD, HTN, fibromyalgia, anxiety, depression  Best friend at bedside and reports pt with no appetite for the last week. Prior to this pt eating well.  Best friend indicates that pt UBW is around 100 pounds; current wt 113 pounds. Net + 1 L  Patient is currently intubated on ventilator support MV: 8.1 L/min Temp (24hrs), Avg:99 F (37.2 C), Min:98 F (36.7 C), Max:100.1 F (37.8 C)  Propofol: off  Labs: CBGs 56-158 Meds: solumedrol  NUTRITION - FOCUSED PHYSICAL EXAM:  Did not perform at RN request, pt very sensitive to stimulation and will attempt to pull out ET tube  Diet Order:   Diet Order            Diet NPO time specified  Diet effective now              EDUCATION NEEDS:   Not appropriate for education at this time  Skin:  Skin Assessment: Reviewed RN Assessment  Last BM:  no documented BM  Height:   Ht Readings from Last 1 Encounters:  08/28/18 4\' 10"  (1.473 m)    Weight:   Wt Readings from Last 1 Encounters:  08/29/18 51.4 kg    Ideal Body Weight:     BMI:  Body mass index is 23.68 kg/m.  Estimated Nutritional Needs:   Kcal:  1328 kcals   Protein:  77-102 g  Fluid:  >>/= 1.3 L   Sarah Starcherate Sarah Vanwinkle MS, RD, LDN, CNSC [phone] Pager   [phone] Weekend/On-Call Pager

## 2018-08-29 NOTE — Progress Notes (Signed)
Critical arterial results given to nurse. Fio2 increased to 60% due to PaO2 level.

## 2018-08-29 NOTE — Progress Notes (Signed)
Sputum culture collected and sent to the lab. 

## 2018-08-29 NOTE — Progress Notes (Addendum)
NAME:  Sarah Chan, MRN:  [id number], DOB:  25-Jul-1966, LOS: 1 ADMISSION DATE:  08/28/2018, CONSULTATION DATE:  08/29/18 REFERRING MD:  Lanier Prude  CHIEF COMPLAINT:  Respiratory Failure   Brief History   Sarah Chan is a 52 y.o. female who was transferred from [facility] with acute on chronic hypercapnic and hypoxemic respiratory failure requiring mechanical ventilation.  Had just had lumbar kyphoplasty 1 day prior.  Past Medical History  COPD on home O2, GERD, HTN, fibromyalgia, anxiety, depression.  Significant Hospital Events   11/21 > presented to [facility], intubated and transferred to [facility].  Consults:  PCCM  Procedures:  ETT 11/21 >   Significant Diagnostic Tests:  CXR 11/21 > emphysema.  Micro Data:  Blood 11/21 >  Sputum 11/21 >  Urine 11/21 >  MRSA PCR >> neg  Antimicrobials:  11/22 unasyn >>  Interim history/subjective:  Reported fever of 101.9 (although not charted) Worsening ABG, transient hypotension although now is improved Remains on fentanyl gtt and low dose propofol  Decent UOP  Objective:  Blood pressure 130/78, pulse (!) 132, temperature 98 F (36.7 C), temperature source Oral, resp. rate (!) 34, height 4\' 10"  (1.473 m), weight 51.4 kg, SpO2 100 %.    Vent Mode: PRVC FiO2 (%):  [50 %-60 %] 50 % Set Rate:  [16 bmp-34 bmp] 34 bmp Vt Set:  [330 mL] 330 mL PEEP:  [5 cmH20] 5 cmH20 Plateau Pressure:  [18 cmH20-27 cmH20] 27 cmH20   Intake/Output Summary (Last 24 hours) at 08/29/2018 4098 Last data filed at 08/29/2018 0600 Gross per 24 hour  Intake 603.66 ml  Output 195 ml  Net 408.66 ml   Filed Weights   08/28/18 2320 08/28/18 2327 08/29/18 0500  Weight: 48.5 kg 51.4 kg 51.4 kg    Examination: General:  Adult female on MV in NAD HEENT: MM pink/moist, ETT 7 cm at 23.5/ OGT, pupils 3/reactive Neuro: Opens eyes to verbal, does not f/c, reaches toward ETT in both hands, MAE CV: ST 130, no murmur, +distal pulses PULM: even/non-labored  on full MV, lungs coarse, R exp wheeze GI: soft, ND, +BS Extremities: warm/dry, no edema, back dressing w/steristrips dry Skin: no rashes  Assessment & Plan:   Acute on chronic hypercapnic and hypoxemic respiratory failure  - CXR with good placement of ETT, new right basilar atelectasis vs ? Aspiration event P:  Worsening ABG after last vent changes Continue full MV support 8 cc/kg (patient is 4'10) Decrease rate to 22 as patient auto peeping with any higher rates Repeat ABG at 0930 Wean FiO2/ PEEP for goal sats 88-94% as patient is chronically on O2/hypoxic respiratory failure Ongoing pulmonary hygiene Trend CXR Check TTE See abx below   Acute encephalopathy - presumed due to poly pharmacy with underlying chronic lung disease - chronic benzo use P:  Wean fentanyl gtt D/c propofol as BP as been softer( presumed due to possible autopeeping) Given chronic anxiety, change to precedex to allow vent weaning Added half home dose xanax enteral to decrease continuous sedation gtts  Hx COPD can not rule out exacerbation at this point P:  Budesonide / Brovana, Duonebs Start solumedrol 40 mg BID Monitor CBG, start SSI if > 180  Hypokalemia, hypomag, hypophos P:  Kphos now Mag 4gm now Recheck renal panel at 1600 trend renal panel/ mag in am   Hypocalcemia - corrects to 7.74 P: S/p 1g Ca gluconate replete Trend   Anemia (Labs from [facility] with Hgb 9.3), stable  P:  Trend CBC Transfuse for Hgb <7  Leukocytosis w/fever - UA neg, possible R bibasilar atelectasis vs aspiration pna - PCT elevated 0.76 P:  Pan cultured overnight, follow culture data Trend PCT x 2 days Check rapid flu Will empirically start unasyn Tylenol prn fever  Back pain - s/p lumbar kyphoplasty 11/20 P: F/u as outpatient. Site remains WNL  Hx fibromyalgia, depression, anxiety P: Restart home xanax 0.5 mgTID, restart vibryd Hold mirtazapine [facility])  Best  Practice:  Diet:  Start TF if not extubated today Pain/Anxiety/Delirium protocol (if indicated): wean fentanyl gtt/ start precedex VAP protocol (if indicated): In place. DVT prophylaxis: SCD's / Heparin SQ GI prophylaxis: Pantoprazole Glucose control: CBG q 4 while on steroids Mobility: Bedrest. Code Status: Full. Family Communication: Rockey SituBrother, Blanchie ServeRandy Huff [phone]) at bedside and updated.  States patient has  Disposition: ICU.  Labs   CBC: Recent Labs  Lab 08/29/18 0051  WBC 11.7*  HGB 9.2*  HCT 33.1*  MCV 97.9  PLT 153   Basic Metabolic Panel: Recent Labs  Lab 08/29/18 0051  NA 138  K 2.8*  CL 103  CO2 29  GLUCOSE 80  BUN <5*  CREATININE 0.56  CALCIUM 6.6*  MG 1.3*  PHOS 1.2*   GFR: Estimated Creatinine Clearance: 58.6 mL/min (by C-G formula based on SCr of 0.56 mg/dL). Recent Labs  Lab 08/29/18 0025 08/29/18 0051  PROCALCITON 0.76  --   WBC  --  11.7*   Liver Function Tests: No results for input(s): AST, ALT, ALKPHOS, BILITOT, PROT, ALBUMIN in the last 168 hours. No results for input(s): LIPASE, AMYLASE in the last 168 hours. No results for input(s): AMMONIA in the last 168 hours. ABG    Component Value Date/Time   PHART 7.174 (LL) 08/29/2018 0735   PCO2ART 74.4 (HH) 08/29/2018 0735   PO2ART 88.0 08/29/2018 0735   HCO3 27.2 08/29/2018 0735   TCO2 29 08/29/2018 0735   ACIDBASEDEF 2.0 08/29/2018 0735   O2SAT 93.0 08/29/2018 0735    Coagulation Profile: No results for input(s): INR, PROTIME in the last 168 hours. Cardiac Enzymes: No results for input(s): CKTOTAL, CKMB, CKMBINDEX, TROPONINI in the last 168 hours. HbA1C: Hgb A1c MFr Bld  Date/Time Value Ref Range Status  05/02/2013 12:05 AM 5.5 <5.7 % Final    Comment:    (NOTE)                                                                       According to the ADA Clinical Practice Recommendations for 2011, when HbA1c is used as a screening test:  >=6.5%   Diagnostic of Diabetes  [place]           (if abnormal result is confirmed) 5.7-6.4%   Increased risk of developing Diabetes Mellitus References:Diagnosis and Classification of Diabetes Mellitus,Diabetes Care,2011,34(Suppl 1):S62-S69 and Standards of Medical Care in         Diabetes - 2011,Diabetes Care,2011,34 (Suppl 1):S11-S61.   CBG: Recent Labs  Lab 08/29/18 0735 08/29/18 0809  GLUCAP 56* 158*    Critical care time:  50 min.   Posey BoyerBrooke Simpson, AGACNP-BC [place] [facility] Pgr: [phone] or if no answer [phone] 08/29/2018, 9:42 AM

## 2018-08-29 NOTE — Progress Notes (Addendum)
eLink Physician-Brief Progress Note Patient Name: Sarah RandRhonda H Rozman DOB: 02-09-1966 MRN: [id number]   Date of Service  08/29/2018  HPI/Events of Note  Hypercapnia  eICU Interventions  Increase resp rate on the ventilator to 34        Bryer Cozzolino U Alexandrina Fiorini 08/29/2018, 1:57 AM

## 2018-08-29 NOTE — Progress Notes (Signed)
ABG drawn, critical results called to E-link MD.  

## 2018-08-29 NOTE — Progress Notes (Signed)
ABG drawn, results given to E-link MD.

## 2018-08-29 NOTE — Progress Notes (Signed)
RR increased to 34 per MD order.

## 2018-08-29 NOTE — Procedures (Signed)
Arterial Catheter Insertion Procedure Note Sarah RandRhonda H Clayson [id number] 01/09/1966  Procedure: Insertion of Arterial Catheter  Indications: Blood pressure monitoring and Frequent blood sampling  Procedure Details Consent: Unable to obtain consent because of altered level of consciousness. Time Out: Verified patient identification, verified procedure, site/side was marked, verified correct patient position, special equipment/implants available, medications/allergies/relevent history reviewed, required imaging and test results available.  Performed  Maximum sterile technique was used including antiseptics, cap, gloves, gown, hand hygiene, mask and sheet. Skin prep: Chlorhexidine; local anesthetic administered 22 gauge catheter was inserted into left radial artery using the Seldinger technique. ULTRASOUND GUIDANCE USED: NO Evaluation Blood flow good; BP tracing good. Complications: No apparent complications.   Cherylin MylarDoyle, Acelin Ferdig 08/29/2018

## 2018-08-29 NOTE — Plan of Care (Signed)
  Problem: Clinical Measurements: Goal: Will remain free from infection Outcome: Progressing   Problem: Coping: Goal: Level of anxiety will decrease Outcome: Progressing   Problem: Elimination: Goal: Will not experience complications related to bowel motility Outcome: Progressing   Problem: Safety: Goal: Ability to remain free from injury will improve Outcome: Progressing   Problem: Skin Integrity: Goal: Risk for impaired skin integrity will decrease Outcome: Progressing   

## 2018-08-29 NOTE — Progress Notes (Signed)
Pharmacy Antibiotic Note  Sarah Chan is a 52 y.o. female admitted on 08/28/2018 with pneumonia.  Pharmacy has been consulted for unasyn dosing.  Pt had been found down unresponsiveness initially, concern for aspiration. CXR showing minimal R basilar subsegmental atelectasis with small R pleural effusion. WBC 11.7, procal 0.76, temp overnight up to 101.9. Scr 0.76 (CrCl 58 mL/min).   Plan: Unasyn 3 g IV every 6 hours  Monitor renal fx, cx results, clinical pic  Height: 4\' 10"  (147.3 cm) Weight: 113 lb 5.1 oz (51.4 kg) IBW/kg (Calculated) : 40.9  Temp (24hrs), Avg:98.7 F (37.1 C), Min:98 F (36.7 C), Max:99.4 F (37.4 C)  Recent Labs  Lab 08/29/18 0051  WBC 11.7*  CREATININE 0.56    Estimated Creatinine Clearance: 58.6 mL/min (by C-G formula based on SCr of 0.56 mg/dL).    Allergies  Allergen Reactions  . Doxycycline Nausea Only  . Trintellix [Vortioxetine] Nausea Only and Other (See Comments)    constipation    Antimicrobials this admission: Unasyn 11/22 >>   Dose adjustments this admission: N/A  Microbiology results: 11/22 BCx: sent 11/22 UCx: sent (UA neg at OSH)  11/22 Sputum: rare GPC  11/21 MRSA PCR: neg  Thank you for allowing pharmacy to be a part of this patient's care.  Sherron MondayKimberly Shelley Cocke, PharmD Clinical Pharmacist  Pager: [phone] Phone: [phone] 08/29/2018 9:55 AM

## 2018-08-30 ENCOUNTER — Inpatient Hospital Stay (HOSPITAL_COMMUNITY): Payer: Medicare Other

## 2018-08-30 ENCOUNTER — Other Ambulatory Visit (HOSPITAL_COMMUNITY): Payer: Self-pay

## 2018-08-30 DIAGNOSIS — J80 Acute respiratory distress syndrome: Secondary | ICD-10-CM

## 2018-08-30 LAB — GLUCOSE, CAPILLARY
GLUCOSE-CAPILLARY: 140 mg/dL — AB (ref 70–99)
GLUCOSE-CAPILLARY: 145 mg/dL — AB (ref 70–99)
Glucose-Capillary: 142 mg/dL — ABNORMAL HIGH (ref 70–99)
Glucose-Capillary: 140 mg/dL — ABNORMAL HIGH (ref 70–99)
Glucose-Capillary: 116 mg/dL — ABNORMAL HIGH (ref 70–99)

## 2018-08-30 LAB — POCT I-STAT 3, ART BLOOD GAS (G3+)
Acid-base deficit: 1 mmol/L (ref 0.0–2.0)
BICARBONATE: 27.2 mmol/L (ref 20.0–28.0)
Bicarbonate: 25.9 mmol/L (ref 20.0–28.0)
O2 Saturation: 100 %
O2 Saturation: 95 %
PCO2 ART: 50.6 mmHg — AB (ref 32.0–48.0)
PH ART: 7.316 — AB (ref 7.350–7.450)
PH ART: 7.3 — AB (ref 7.350–7.450)
TCO2: 27 mmol/L (ref 22–32)
TCO2: 29 mmol/L (ref 22–32)
pCO2 arterial: 55.5 mmHg — ABNORMAL HIGH (ref 32.0–48.0)
pO2, Arterial: 207 mmHg — ABNORMAL HIGH (ref 83.0–108.0)
pO2, Arterial: 86 mmHg (ref 83.0–108.0)

## 2018-08-30 LAB — URINE CULTURE: Culture: <10000 — AB

## 2018-08-30 LAB — CBC
HCT: 31.3 % — ABNORMAL LOW (ref 36.0–46.0)
Hemoglobin: 8.6 g/dL — ABNORMAL LOW (ref 12.0–15.0)
MCH: 26.5 pg (ref 26.0–34.0)
MCHC: 27.5 g/dL — ABNORMAL LOW (ref 30.0–36.0)
MCV: 96.3 fL (ref 80.0–100.0)
NRBC: 0 % (ref 0.0–0.2)
PLATELETS: 160 10*3/uL (ref 150–400)
RBC: 3.25 MIL/uL — AB (ref 3.87–5.11)
RDW: 13.2 % (ref 11.5–15.5)
WBC: 5.6 10*3/uL (ref 4.0–10.5)

## 2018-08-30 LAB — TRIGLYCERIDES: TRIGLYCERIDES: 71 mg/dL (ref <150)

## 2018-08-30 LAB — BASIC METABOLIC PANEL
ANION GAP: 8 (ref 5–15)
BUN: 10 mg/dL (ref 6–20)
CALCIUM: 6.3 mg/dL — AB (ref 8.9–10.3)
CO2: 23 mmol/L (ref 22–32)
CREATININE: 0.84 mg/dL (ref 0.44–1.00)
Chloride: 104 mmol/L (ref 98–111)
Glucose, Bld: 165 mg/dL — ABNORMAL HIGH (ref 70–99)
Potassium: 4 mmol/L (ref 3.5–5.1)
SODIUM: 135 mmol/L (ref 135–145)

## 2018-08-30 LAB — ECHOCARDIOGRAM COMPLETE
HEIGHTINCHES: 58 in
Weight: 1830.7 oz

## 2018-08-30 LAB — PHOSPHORUS: PHOSPHORUS: 1.9 mg/dL — AB (ref 2.5–4.6)

## 2018-08-30 LAB — MAGNESIUM: MAGNESIUM: 2.5 mg/dL — AB (ref 1.7–2.4)

## 2018-08-30 LAB — ALBUMIN: ALBUMIN: 2.6 g/dL — AB (ref 3.5–5.0)

## 2018-08-30 LAB — PROCALCITONIN: PROCALCITONIN: 0.57 ng/mL

## 2018-08-30 MED ORDER — SODIUM CHLORIDE 0.9 % IV SOLN
1.0000 g | Freq: Once | INTRAVENOUS | Status: AC
  Administered 2018-08-30: 1 g via INTRAVENOUS
  Filled 2018-08-30: qty 10

## 2018-08-30 MED ORDER — SODIUM PHOSPHATES 45 MMOLE/15ML IV SOLN
30.0000 mmol | Freq: Once | INTRAVENOUS | Status: AC
  Administered 2018-08-30: 30 mmol via INTRAVENOUS
  Filled 2018-08-30: qty 10

## 2018-08-30 MED ORDER — VILAZODONE HCL 40 MG PO TABS
40.0000 mg | ORAL_TABLET | Freq: Every day | ORAL | Status: DC
  Administered 2018-08-30 – 2018-09-05 (×7): 40 mg via ORAL
  Filled 2018-08-30 (×8): qty 1

## 2018-08-30 MED ORDER — PROPOFOL 1000 MG/100ML IV EMUL
5.0000 ug/kg/min | INTRAVENOUS | Status: DC
  Administered 2018-08-30: 5 ug/kg/min via INTRAVENOUS
  Administered 2018-08-30: 50 ug/kg/min via INTRAVENOUS
  Administered 2018-08-30: 55 ug/kg/min via INTRAVENOUS
  Administered 2018-08-31: 60 ug/kg/min via INTRAVENOUS
  Administered 2018-08-31: 80 ug/kg/min via INTRAVENOUS
  Administered 2018-08-31: 50 ug/kg/min via INTRAVENOUS
  Filled 2018-08-30 (×6): qty 100

## 2018-08-30 MED ORDER — SODIUM CHLORIDE 0.9 % IV SOLN: 1.0000 g | Freq: Once | INTRAVENOUS | Status: DC

## 2018-08-30 NOTE — Progress Notes (Signed)
eLink Physician-Brief Progress Note Patient Name: Sarah RandRhonda H Chan DOB: 1966/04/26 MRN: [id number]   Date of Service  08/30/2018  HPI/Events of Note  Hypocalcemia and hypophos  eICU Interventions  Calcium and Phos replaced     Intervention Category Intermediate Interventions: Electrolyte abnormality - evaluation and management  [name] 08/30/2018, 6:59 AM

## 2018-08-30 NOTE — Progress Notes (Signed)
  Echocardiogram 2D Echocardiogram has been performed.  Sarah Chan, Sarah Chan 08/30/2018, 3:07 PM

## 2018-08-31 ENCOUNTER — Inpatient Hospital Stay (HOSPITAL_COMMUNITY): Payer: Medicare Other

## 2018-08-31 DIAGNOSIS — G9340 Encephalopathy, unspecified: Secondary | ICD-10-CM

## 2018-08-31 DIAGNOSIS — R251 Tremor, unspecified: Secondary | ICD-10-CM

## 2018-08-31 DIAGNOSIS — R5081 Fever presenting with conditions classified elsewhere: Secondary | ICD-10-CM

## 2018-08-31 DIAGNOSIS — R509 Fever, unspecified: Secondary | ICD-10-CM

## 2018-08-31 DIAGNOSIS — J9622 Acute and chronic respiratory failure with hypercapnia: Secondary | ICD-10-CM

## 2018-08-31 DIAGNOSIS — J9602 Acute respiratory failure with hypercapnia: Secondary | ICD-10-CM

## 2018-08-31 LAB — COMPREHENSIVE METABOLIC PANEL
ALT: 10 U/L (ref 0–44)
ANION GAP: 10 (ref 5–15)
AST: 16 U/L (ref 15–41)
Albumin: 2.5 g/dL — ABNORMAL LOW (ref 3.5–5.0)
Alkaline Phosphatase: 61 U/L (ref 38–126)
BUN: 17 mg/dL (ref 6–20)
CHLORIDE: 107 mmol/L (ref 98–111)
CO2: 23 mmol/L (ref 22–32)
Calcium: 6.8 mg/dL — ABNORMAL LOW (ref 8.9–10.3)
Creatinine, Ser: 0.81 mg/dL (ref 0.44–1.00)
GFR calc non Af Amer: >60 mL/min (ref >60)
Glucose, Bld: 135 mg/dL — ABNORMAL HIGH (ref 70–99)
Potassium: 3.2 mmol/L — ABNORMAL LOW (ref 3.5–5.1)
SODIUM: 140 mmol/L (ref 135–145)
Total Bilirubin: 0.7 mg/dL (ref 0.3–1.2)
Total Protein: 5.2 g/dL — ABNORMAL LOW (ref 6.5–8.1)

## 2018-08-31 LAB — URINALYSIS, ROUTINE W REFLEX MICROSCOPIC
Bilirubin Urine: NEGATIVE
Glucose, UA: NEGATIVE mg/dL
Ketones, ur: 20 mg/dL — AB
Leukocytes, UA: NEGATIVE
NITRITE: NEGATIVE
PROTEIN: 30 mg/dL — AB
SPECIFIC GRAVITY, URINE: 1.026 (ref 1.005–1.030)
pH: 5 (ref 5.0–8.0)

## 2018-08-31 LAB — POCT I-STAT 3, ART BLOOD GAS (G3+)
Bicarbonate: 25.2 mmol/L (ref 20.0–28.0)
O2 Saturation: 99 %
PCO2 ART: 40.7 mmHg (ref 32.0–48.0)
PO2 ART: 126 mmHg — AB (ref 83.0–108.0)
Patient temperature: 99.2
TCO2: 26 mmol/L (ref 22–32)
pH, Arterial: 7.401 (ref 7.350–7.450)

## 2018-08-31 LAB — GLUCOSE, CAPILLARY
GLUCOSE-CAPILLARY: 116 mg/dL — AB (ref 70–99)
GLUCOSE-CAPILLARY: 119 mg/dL — AB (ref 70–99)
GLUCOSE-CAPILLARY: 120 mg/dL — AB (ref 70–99)
Glucose-Capillary: 123 mg/dL — ABNORMAL HIGH (ref 70–99)
Glucose-Capillary: 119 mg/dL — ABNORMAL HIGH (ref 70–99)
Glucose-Capillary: 148 mg/dL — ABNORMAL HIGH (ref 70–99)

## 2018-08-31 LAB — CULTURE, RESPIRATORY W GRAM STAIN: Culture: NORMAL

## 2018-08-31 LAB — PROCALCITONIN: Procalcitonin: 0.31 ng/mL

## 2018-08-31 LAB — MAGNESIUM: MAGNESIUM: 2.6 mg/dL — AB (ref 1.7–2.4)

## 2018-08-31 LAB — PHOSPHORUS: PHOSPHORUS: 2.1 mg/dL — AB (ref 2.5–4.6)

## 2018-08-31 LAB — CULTURE, RESPIRATORY

## 2018-08-31 MED ORDER — FENTANYL CITRATE (PF) 100 MCG/2ML IJ SOLN
50.0000 ug | INTRAMUSCULAR | Status: DC | PRN
  Administered 2018-08-31 – 2018-09-04 (×15): 50 ug via INTRAVENOUS
  Filled 2018-08-31 (×6): qty 2

## 2018-08-31 MED ORDER — FUROSEMIDE 10 MG/ML IJ SOLN
10.0000 mg | Freq: Once | INTRAMUSCULAR | Status: AC
  Administered 2018-08-31: 10 mg via INTRAVENOUS
  Filled 2018-08-31: qty 2

## 2018-08-31 MED ORDER — MIDAZOLAM HCL 2 MG/2ML IJ SOLN
4.0000 mg | Freq: Once | INTRAMUSCULAR | Status: AC
  Administered 2018-08-31: 4 mg via INTRAVENOUS

## 2018-08-31 MED ORDER — SODIUM CHLORIDE 0.9 % IV SOLN
0.5000 mg/h | INTRAVENOUS | Status: DC
  Administered 2018-08-31: 4 mg/h via INTRAVENOUS
  Administered 2018-08-31: 5 mg/h via INTRAVENOUS
  Administered 2018-09-01: 10 mg/h via INTRAVENOUS
  Administered 2018-09-01: 6 mg/h via INTRAVENOUS
  Administered 2018-09-01: 10 mg/h via INTRAVENOUS
  Filled 2018-08-31 (×5): qty 10

## 2018-08-31 MED ORDER — SODIUM CHLORIDE 0.9 % IV SOLN
0.5000 mg/h | INTRAVENOUS | Status: DC
  Filled 2018-08-31: qty 10

## 2018-08-31 MED ORDER — POTASSIUM CHLORIDE 20 MEQ PO PACK
40.0000 meq | PACK | Freq: Once | ORAL | Status: AC
  Administered 2018-08-31: 40 meq via ORAL
  Filled 2018-08-31: qty 2

## 2018-08-31 MED ORDER — VITAL AF 1.2 CAL PO LIQD
1000.0000 mL | ORAL | Status: DC
  Administered 2018-08-31 – 2018-09-02 (×3): 1000 mL
  Filled 2018-08-31 (×2): qty 1500
  Filled 2018-08-31: qty 1000

## 2018-08-31 MED ORDER — CALCIUM GLUCONATE-NACL 1-0.675 GM/50ML-% IV SOLN
1.0000 g | Freq: Once | INTRAVENOUS | Status: AC
  Administered 2018-08-31: 1000 mg via INTRAVENOUS
  Filled 2018-08-31: qty 50

## 2018-08-31 NOTE — Procedures (Signed)
EEG Report  Clinical History:  Respiratory failure and altered mental status s/p lumbar kyphoplasty yesterday.  Technical Summary:  A 19 channel digital EEG recording was performed using the 10-20 international system of electrode placement.  Bipolar and Referential montages were used.  The total recording time was approx 20 minutes.  Findings:  There is no posterior dominant rhythm.  Background frequencies are about 6 Hz symmetrical.  No focal slowing.  There are prominent and frequent triphasic waveforms synchronously in both hemispheres.  There are no epileptiform discharges or electrographic seizures.     Impression:  This is an abnormal EEG.  There is moderate generalized slowing of brain activity and encephalopathic waveforms typical of a metabolic, toxic, infectious, or hypoxic etiology.  The patient is not in non-convulsive status epilepticus.  Clinical correlation is recommended.  Weston SettleShervin Biddie Sebek, MS, MD

## 2018-08-31 NOTE — Progress Notes (Signed)
NAME:  Sarah RandRhonda H Tieken, MRN:  [id number], DOB:  1966-05-13, LOS: 3 ADMISSION DATE:  08/28/2018, CONSULTATION DATE:  08/29/18 REFERRING MD:  [facility] [place]  CHIEF COMPLAINT:  Respiratory Failure   Brief History   Sarah Chan is a 52 y.o. female [facility] on chronic hypercapnic and hypoxemic respiratory failure requiring mechanical ventilation.  Had just had lumbar kyphoplasty 1 day prior.  Past Medical History  COPD on home O2, GERD, HTN, fibromyalgia, anxiety, depression.  Significant Hospital Events   [facility] Reported fever of 101.9 11/24 Episodes of coughing, tachypnea, tremors Resolve with versed.  Resp status improving.  Consults:  PCCM  Procedures:  ETT 11/21 >   Significant Diagnostic Tests:  CXR 11/21 > emphysema. CT head 11/24  Micro Data:  Blood 11/21 >  Sputum 11/21 >  Urine 11/21 >  MRSA PCR >> neg  Antimicrobials:  11/22 unasyn >>  Interim history/subjective:  Unable to assess subjective  Objective:  Blood pressure 110/67, pulse (!) 116, temperature 99 F (37.2 C), temperature source Oral, resp. rate (!) 26, height 4\' 10"  (1.473 m), weight 51.9 kg, SpO2 100 %.    Vent Mode: PRVC FiO2 (%):  [40 %] 40 % Set Rate:  [26 bmp] 26 bmp Vt Set:  [500 mL] 500 mL PEEP:  [5 cmH20] 5 cmH20 Plateau Pressure:  [28 cmH20-29 cmH20] 29 cmH20   Intake/Output Summary (Last 24 hours) at 08/31/2018 1104 Last data filed at 08/31/2018 1038 Gross per 24 hour  Intake 2424.91 ml  Output 710 ml  Net 1714.91 ml   Filed Weights   08/28/18 2327 08/29/18 0500 08/30/18 0500  Weight: 51.4 kg 51.4 kg 51.9 kg    Examination: General:  NAD, barrel chest, episodes of tremors.  HEENT:NCAT Neuro:opens eyes and makes eye contact, follows commands off sedation CV: RRRno mgr PULM: no wheezing, poor air movement but improved significantly.  GI: soft, ND, +BS Extremities: warm/dry, no  edema, back dressing w/steristrips dry Skin: no rashes  Assessment & Plan:   Acute on chronic hypercapnic and hypoxemic respiratory failure  COPD exacerbation, acute.  Cont steroids, CAP abtx coverage (though no discrete consolidation on CXR) Budesonide / Brovana, Duonebs Hypercarbia, resp acidosis improved today.  Decreased TV to 628ml/kg and RR to 20.  Appeared much for comfortable, moving air more effectively.    Had suspected cardiac failure and or RV failure given low ETCO2 compared to ABG CO2, however TTE essentially normal.  Consider starting to taper steroids tomorrow.   D/c unasyn after 5 days (Tuesday will be day 5)  Leukocytosis w/fever - resolved - UA neg, possible R bibasilar atelectasis vs aspiration pna - PCT elevated 0.76 Cont total of 5 days unasyn (WBC seen on sputum Gram stain, cx pending).  Blood cx negative.    Acute encephalopathy - was initially likely 2/2 sedation and or acidosis, now appears to be in benzo withdrawal.   Tremor, agitation, tachypnea, tachycardia when off sedation.  Given hx of benzo use and severe anxiety (brother notes tremors occasionally), likely benzo withdrawal, although she has been receiving versed pushes.   Will stop propofol and start on Versed gtt for now.  When she has signs of withdrawal she becomes dyssinchronous with the vent and I would like to focus on her pulm recovery.   NO seizure on EEG.  F/u CT head.    [facility] stable  P:  Trend CBC Transfuse for Hgb <7  Back pain - s/p lumbar kyphoplasty 11/20 P: F/u as outpatient. Site remains WNL  Hx fibromyalgia, depression, anxiety P: Restart home xanax 0.5 mgTID, restart vibryd Cont remeron for sleep.   Best Practice:  Diet:  Start TF  Pain/Anxiety/Delirium protocol (if indicated): changing to versed, fentanyl for pain. .   VAP protocol (if indicated): In place. DVT prophylaxis: SCD's / Heparin SQ GI prophylaxis: Pantoprazole Glucose control:  CBG q 4 while on steroids Mobility: Bedrest. Code Status: Full. Family Communication: Rockey Situ Blanchie Serve [phone]) at bedside and updated.  States patient has  Disposition: ICU.  Labs   CBC: Recent Labs  Lab 08/29/18 0051 08/30/18 0315  WBC 11.7* 5.6  HGB 9.2* 8.6*  HCT 33.1* 31.3*  MCV 97.9 96.3  PLT 153 160   Basic Metabolic Panel: Recent Labs  Lab 08/29/18 0051 08/29/18 1559 08/30/18 0315 08/30/18 0544 08/31/18 0316  NA 138 139  --  135 140  K 2.8* 4.3  --  4.0 3.2*  CL 103 106  --  104 107  CO2 29 28  --  23 23  GLUCOSE 80 165*  --  165* 135*  BUN <5* 6  --  10 17  CREATININE 0.56 0.61  --  0.84 0.81  CALCIUM 6.6* 6.6*  --  6.3* 6.8*  MG 1.3*  --  2.5*  --   --   PHOS 1.2* 3.3 1.9*  --   --    GFR: Estimated Creatinine Clearance: 58.1 mL/min (by C-G formula based on SCr of 0.81 mg/dL). Recent Labs  Lab 08/29/18 0025 08/29/18 0051 08/30/18 0315 08/31/18 0316  PROCALCITON 0.76  --  0.57 0.31  WBC  --  11.7* 5.6  --    Liver Function Tests: Recent Labs  Lab 08/30/18 0856 08/31/18 0316  AST  --  16  ALT  --  10  ALKPHOS  --  61  BILITOT  --  0.7  PROT  --  5.2*  ALBUMIN 2.6* 2.5*   Echo 11/23:  - Left ventricle: The cavity size was normal. Wall thickness was   normal. Systolic function was normal. The estimated ejection   fraction was in the range of 55% to 60%. Wall motion was normal;   there were no regional wall motion abnormalities. Doppler   parameters are consistent with abnormal left ventricular   relaxation (grade 1 diastolic dysfunction). No results for input(s): LIPASE, AMYLASE in the last 168 hours. No results for input(s): AMMONIA in the last 168 hours. ABG    Component Value Date/Time   PHART 7.300 (L) 08/30/2018 0920   PCO2ART 55.5 (H) 08/30/2018 0920   PO2ART 86.0 08/30/2018 0920   HCO3 27.2 08/30/2018 0920   TCO2 29 08/30/2018 0920   ACIDBASEDEF 1.0 08/30/2018 0200   O2SAT 95.0 08/30/2018 0920    Coagulation  Profile: No results for input(s): INR, PROTIME in the last 168 hours. Cardiac Enzymes: No results for input(s): CKTOTAL, CKMB, CKMBINDEX, TROPONINI in the last 168 hours. HbA1C: Hgb A1c MFr Bld  Date/Time Value Ref Range Status  05/02/2013 12:05 AM 5.5 <5.7 % Final    Comment:    (NOTE)  According to the ADA Clinical Practice Recommendations for 2011, when HbA1c is used as a screening test:  >=6.5%   Diagnostic of Diabetes Mellitus           (if abnormal result is confirmed) 5.7-6.4%   Increased risk of developing Diabetes Mellitus References:Diagnosis and Classification of Diabetes Mellitus,Diabetes Care,2011,34(Suppl 1):S62-S69 and Standards of Medical Care in         Diabetes - 2011,Diabetes Care,2011,34 (Suppl 1):S11-S61.   CBG: Recent Labs  Lab 08/30/18 1516 08/30/18 2011 08/30/18 2359 08/31/18 0345 08/31/18 0730  GLUCAP 145* 116* 120* 116* 123*    Critical care time:  70 min.

## 2018-08-31 NOTE — Progress Notes (Signed)
EEG complete - results pending 

## 2018-08-31 NOTE — Progress Notes (Signed)
Brief Nutrition Note  Consult received for enteral/tube feeding initiation and management.  Adult Enteral Nutrition Protocol initiated. RD ordered TF based on recommendations from initial assessment 11/22.  [facility]  Admitting Dx: Respiratory Failure   Body mass index is 20.93 kg/m. Pt meets criteria for normal based on current BMI.  Labs:  Recent Labs  Lab 08/29/18 0051 08/29/18 1559 08/30/18 0315 08/30/18 0544 08/31/18 0316  NA 138 139  --  135 140  K 2.8* 4.3  --  4.0 3.2*  CL 103 106  --  104 107  CO2 29 28  --  23 23  BUN <5* 6  --  10 17  CREATININE 0.56 0.61  --  0.84 0.81  CALCIUM 6.6* 6.6*  --  6.3* 6.8*  MG 1.3*  --  2.5*  --   --   PHOS 1.2* 3.3 1.9*  --   --   GLUCOSE 80 165*  --  165* 135*    Sarah FrancoLindsey Lamir Racca, MS, RD, LDN [facility] Clinical Dietitian Pager: [phone] After Hours Pager: [phone]

## 2018-08-31 NOTE — Progress Notes (Signed)
NAME:  Sarah Chan, MRN:  [id number], DOB:  25-Feb-1966, LOS: 3 ADMISSION DATE:  08/28/2018, CONSULTATION DATE:  08/29/18 REFERRING MD:  [facility] [place]  CHIEF COMPLAINT:  Respiratory Failure   Brief History   Sarah RandRhonda H Ravan is a 52 y.o. female who [facility] on chronic hypercapnic and hypoxemic respiratory failure requiring mechanical ventilation.  Had just had lumbar kyphoplasty 1 day prior.  Past Medical History  COPD on home O2, GERD, HTN, fibromyalgia, anxiety, depression.  Significant Hospital Events   [facility] Reported fever of 101.9 Consults:  PCCM  Procedures:  ETT 11/21 >   Significant Diagnostic Tests:  CXR 11/21 > emphysema.  Micro Data:  Blood 11/21 >  Sputum 11/21 >  Urine 11/21 >  MRSA PCR >> neg  Antimicrobials:  11/22 unasyn >>  Interim history/subjective:    Objective:  Blood pressure 97/67, pulse (!) 103, temperature 98.2 F (36.8 C), temperature source Oral, resp. rate (!) 6, height 4\' 10"  (1.473 m), weight 51.9 kg, SpO2 100 %.    Vent Mode: PRVC FiO2 (%):  [40 %] 40 % Set Rate:  [26 bmp] 26 bmp Vt Set:  [500 mL] 500 mL PEEP:  [5 cmH20] 5 cmH20 Plateau Pressure:  [28 cmH20-33 cmH20] 28 cmH20   Intake/Output Summary (Last 24 hours) at 08/31/2018 0017 Last data filed at 08/30/2018 2300 Gross per 24 hour  Intake 2809.86 ml  Output 442 ml  Net 2367.86 ml   Filed Weights   08/28/18 2327 08/29/18 0500 08/30/18 0500  Weight: 51.4 kg 51.4 kg 51.9 kg    Examination: General:  Adult female on MV in NAD HEENT:NCAT Neuro:opens eyes and makes eye contact, not following commands consistently CV: RRRno mgr PULM:poor air movement though greatly improved  GI: soft, ND, +BS Extremities: warm/dry, no edema, back dressing w/steristrips dry Skin: no rashes  Assessment & Plan:   Acute on chronic hypercapnic and hypoxemic respiratory failure  COPD exacerbation,  acute.  Cont steroids, CAP abtx coverage (though no discrete consolidation on CXR) Budesonide / Brovana, Duonebs Leave current vent settings, improved ABG but still borderline.  Hypoxia less of the issue, more profound is the hypercarbia, though improving.   - CXR with good placement of ETT, new right basilar atelectasis vs ? Aspiration event TTE pending.   Leukocytosis w/fever - UA neg, possible R bibasilar atelectasis vs aspiration pna - PCT elevated 0.76 P:  Flu negative  Cx pending    Acute encephalopathy - at this point likely 2/2 sedation.  Hold opiates and benzos if possible.   Monitor for withdrawal.  Neuro exam non focal.  Consider CT head, but I would prefer not to move her from the unit given tenuous resp status unless absolutely necessary.  Propofol for sedation given Hypotension has resolved.  Faster off for better neuro exams  [facility]), stable  P:  Trend CBC Transfuse for Hgb <7  Back pain - s/p lumbar kyphoplasty 11/20 P: F/u as outpatient. Site remains WNL  Hx fibromyalgia, depression, anxiety P: Restart home xanax 0.5 mgTID, restart vibryd Cont remeron for sleep.   Best Practice:  Diet:  Start TF  Pain/Anxiety/Delirium protocol (if indicated): started propofol today.   VAP protocol (if indicated): In place. DVT prophylaxis: SCD's / Heparin SQ GI prophylaxis: Pantoprazole Glucose control: CBG q 4 while on steroids Mobility: Bedrest. Code Status: Full. Family Communication: Karlene EinsteinBrother, Randy Huff [phone]  at bedside and updated.  States patient has  Disposition: ICU.  Labs   CBC: Recent Labs  Lab 08/29/18 0051 08/30/18 0315  WBC 11.7* 5.6  HGB 9.2* 8.6*  HCT 33.1* 31.3*  MCV 97.9 96.3  PLT 153 160   Basic Metabolic Panel: Recent Labs  Lab 08/29/18 0051 08/29/18 1559 08/30/18 0315 08/30/18 0544  NA 138 139  --  135  K 2.8* 4.3  --  4.0  CL 103 106  --  104  CO2 29 28  --  23  GLUCOSE 80 165*  --   165*  BUN <5* 6  --  10  CREATININE 0.56 0.61  --  0.84  CALCIUM 6.6* 6.6*  --  6.3*  MG 1.3*  --  2.5*  --   PHOS 1.2* 3.3 1.9*  --    GFR: Estimated Creatinine Clearance: 56 mL/min (by C-G formula based on SCr of 0.84 mg/dL). Recent Labs  Lab 08/29/18 0025 08/29/18 0051 08/30/18 0315  PROCALCITON 0.76  --  0.57  WBC  --  11.7* 5.6   Liver Function Tests: Recent Labs  Lab 08/30/18 0856  ALBUMIN 2.6*   No results for input(s): LIPASE, AMYLASE in the last 168 hours. No results for input(s): AMMONIA in the last 168 hours. ABG    Component Value Date/Time   PHART 7.300 (L) 08/30/2018 0920   PCO2ART 55.5 (H) 08/30/2018 0920   PO2ART 86.0 08/30/2018 0920   HCO3 27.2 08/30/2018 0920   TCO2 29 08/30/2018 0920   ACIDBASEDEF 1.0 08/30/2018 0200   O2SAT 95.0 08/30/2018 0920    Coagulation Profile: No results for input(s): INR, PROTIME in the last 168 hours. Cardiac Enzymes: No results for input(s): CKTOTAL, CKMB, CKMBINDEX, TROPONINI in the last 168 hours. HbA1C: Hgb A1c MFr Bld  Date/Time Value Ref Range Status  05/02/2013 12:05 AM 5.5 <5.7 % Final    Comment:    (NOTE)                                                                       According to the ADA Clinical Practice Recommendations for 2011, when HbA1c is used as a screening test:  >=6.5%   Diagnostic of Diabetes Mellitus           (if abnormal result is confirmed) 5.7-6.4%   Increased risk of developing Diabetes Mellitus References:Diagnosis and Classification of Diabetes Mellitus,Diabetes Care,2011,34(Suppl 1):S62-S69 and Standards of Medical Care in         Diabetes - 2011,Diabetes Care,2011,34 (Suppl 1):S11-S61.   CBG: Recent Labs  Lab 08/30/18 0306 08/30/18 0752 08/30/18 1118 08/30/18 1516 08/30/18 2011  GLUCAP 140* 142* 140* 145* 116*    Critical care time:  50 min.

## 2018-08-31 NOTE — Progress Notes (Signed)
Patient was transported to CT & back to 2H14 without any complications.

## 2018-09-01 DIAGNOSIS — J441 Chronic obstructive pulmonary disease with (acute) exacerbation: Principal | ICD-10-CM

## 2018-09-01 LAB — MAGNESIUM
MAGNESIUM: 2.3 mg/dL (ref 1.7–2.4)
Magnesium: 2.6 mg/dL — ABNORMAL HIGH (ref 1.7–2.4)
Magnesium: 2.4 mg/dL (ref 1.7–2.4)

## 2018-09-01 LAB — BASIC METABOLIC PANEL
ANION GAP: 5 (ref 5–15)
BUN: 20 mg/dL (ref 6–20)
CALCIUM: 6.8 mg/dL — AB (ref 8.9–10.3)
CO2: 29 mmol/L (ref 22–32)
CREATININE: 0.65 mg/dL (ref 0.44–1.00)
Chloride: 110 mmol/L (ref 98–111)
GFR calc Af Amer: >60 mL/min (ref >60)
GFR calc non Af Amer: >60 mL/min (ref >60)
GLUCOSE: 174 mg/dL — AB (ref 70–99)
Potassium: 3.9 mmol/L (ref 3.5–5.1)
Sodium: 144 mmol/L (ref 135–145)

## 2018-09-01 LAB — CBC
HCT: 35.2 % — ABNORMAL LOW (ref 36.0–46.0)
Hemoglobin: 9.9 g/dL — ABNORMAL LOW (ref 12.0–15.0)
MCH: 26.3 pg (ref 26.0–34.0)
MCHC: 28.1 g/dL — ABNORMAL LOW (ref 30.0–36.0)
MCV: 93.4 fL (ref 80.0–100.0)
PLATELETS: 268 10*3/uL (ref 150–400)
RBC: 3.77 MIL/uL — AB (ref 3.87–5.11)
RDW: 14.1 % (ref 11.5–15.5)
WBC: 20.4 10*3/uL — ABNORMAL HIGH (ref 4.0–10.5)
nRBC: 0 % (ref 0.0–0.2)

## 2018-09-01 LAB — GLUCOSE, CAPILLARY
GLUCOSE-CAPILLARY: 132 mg/dL — AB (ref 70–99)
GLUCOSE-CAPILLARY: 154 mg/dL — AB (ref 70–99)
Glucose-Capillary: 125 mg/dL — ABNORMAL HIGH (ref 70–99)
Glucose-Capillary: 120 mg/dL — ABNORMAL HIGH (ref 70–99)
Glucose-Capillary: 140 mg/dL — ABNORMAL HIGH (ref 70–99)
Glucose-Capillary: 93 mg/dL (ref 70–99)

## 2018-09-01 LAB — PHOSPHORUS
PHOSPHORUS: 2.4 mg/dL — AB (ref 2.5–4.6)
Phosphorus: 2.1 mg/dL — ABNORMAL LOW (ref 2.5–4.6)
Phosphorus: 2.8 mg/dL (ref 2.5–4.6)

## 2018-09-01 MED ORDER — FUROSEMIDE 10 MG/ML IJ SOLN
40.0000 mg | Freq: Four times a day (QID) | INTRAMUSCULAR | Status: AC
  Administered 2018-09-01 (×3): 40 mg via INTRAVENOUS
  Filled 2018-09-01 (×3): qty 4

## 2018-09-01 NOTE — Progress Notes (Signed)
Nutrition Follow-up  DOCUMENTATION CODES:   Not applicable  INTERVENTION:   Tube Feeding:  Continue Vital AF 1.2 @ 45 ml/hr Provides 81 g of protein, 1296 kcals and 875 mL of free water Meets 100% protein needs, 94% calorie neds   NUTRITION DIAGNOSIS:   Inadequate oral intake related to acute illness as evidenced by NPO status.  Being addressed via TF   GOAL:   Patient will meet greater than or equal to 90% of their needs  Met  MONITOR:   Vent status, TF tolerance, Labs, Weight trends, Skin  REASON FOR ASSESSMENT:   Ventilator    ASSESSMENT:   52 yo female [facility] with acute on chronic respiratory failure post lumbar kyphoplasty. PMH includes COPD on home O2, GERD, HTN, fibromyalgia, anxiety, depression  Patient is currently intubated on ventilator support MV: 8.7 L/min Temp (24hrs), Avg:98.9 F (37.2 C), Min:98.2 F (36.8 C), Max:99.7 F (37.6 C)  TF started via Adult Tube feeding Protocol yesterday. Vital AF 1.2 @ 45 ml/hr  Propofol: OFF  Weight up; utilizing admission wt of 51.4 kg.Net +5 L since admission; current wt 57.5 kg  Labs: phosphorus 2.4, magnesium 2.6 Meds: lasix, KCl, solumedrol, fentanyl, versed   Diet Order:   Diet Order            Diet NPO time specified  Diet effective now              EDUCATION NEEDS:   Not appropriate for education at this time  Skin:  Skin Assessment: Reviewed RN Assessment  Last BM:  no documented BM  Height:   Ht Readings from Last 1 Encounters:  08/31/18 5' 2"  (1.575 m)    Weight:   Wt Readings from Last 1 Encounters:  09/01/18 57.5 kg    Ideal Body Weight:     BMI:  Body mass index is 23.19 kg/m.  Estimated Nutritional Needs:   Kcal:  1375 kcals   Protein:  77-102 g  Fluid:  >>/= 1.3 L   Kerman Passey MS, RD, LDN, CNSC [phone] Pager  [phone] Weekend/On-Call Pager

## 2018-09-01 NOTE — Progress Notes (Signed)
NAME:  Sarah RandRhonda H Aube, MRN:  [id number], DOB:  [birth year], LOS: 4 ADMISSION DATE:  08/28/2018, CONSULTATION DATE:  08/29/18 REFERRING MD:  Lanier PrudeUNC [place]  CHIEF COMPLAINT:  Respiratory Failure   Brief History   Sarah Chan is a 52 y.o. female who [facility] with acute on chronic hypercapnic and hypoxemic respiratory failure requiring mechanical ventilation.  Had just had lumbar kyphoplasty 1 day prior.  Past Medical History  COPD on home O2, GERD, HTN, fibromyalgia, anxiety, depression.  Significant Hospital Events   [facility] > [facility]. Reported fever of 101.9 11/24 Episodes of coughing, tachypnea, tremors Resolve with versed.  Resp status improving.  Consults:  PCCM  Procedures:  ETT 11/21>>>  Significant Diagnostic Tests:  CXR 11/21 > emphysema. CT head 11/24  Micro Data:  Blood 11/21 >  Sputum 11/21 >  Urine 11/21 >  MRSA PCR >> neg  Antimicrobials:  11/22 unasyn >>  Interim history/subjective:  Very agitated overnight requiring high dose sedation   Objective:  Blood pressure (!) 146/91, pulse (!) 110, temperature 99.7 F (37.6 C), temperature source Oral, resp. rate (!) 29, height 5\' 2"  (1.575 m), weight 57.5 kg, SpO2 92 %.    Vent Mode: PRVC FiO2 (%):  [30 %-40 %] 30 % Set Rate:  [20 bmp-26 bmp] 20 bmp Vt Set:  [400 mL-500 mL] 400 mL PEEP:  [5 cmH20] 5 cmH20 Plateau Pressure:  [16 cmH20-27 cmH20] 27 cmH20   Intake/Output Summary (Last 24 hours) at 09/01/2018 1034 Last data filed at 09/01/2018 1000 Gross per 24 hour  Intake 2412.91 ml  Output 1840 ml  Net 572.91 ml   Filed Weights   08/30/18 0500 08/31/18 0530 09/01/18 0530  Weight: 51.9 kg 55.1 kg 57.5 kg    Examination: General:  Acutely ill appearing female, sedate on vent HEENT: [place]/AT, PERRL, EOM-I and MMM Neuro: Alert and interactive, moving all ext to command CV: Sinus tach, Nl S1/S2 and -M/R/G PULM: Coarse BS  diffusely GI: Soft, NT, ND and +BS Extremities: warm/dry, no edema, back dressing w/steristrips dry, 2+ edema Skin: no rashes  Assessment & Plan:   Acute on chronic hypercapnic and hypoxemic respiratory failure  COPD exacerbation, acute.  Cont steroids, CAP abtx coverage (though no discrete consolidation on CXR) Budesonide / Rosalyn GessBrovana, Duonebs D/c unasyn after 5 days (Tuesday will be day 5) Begin weaning trials Active diureses for now CXR and ABG in AM  Leukocytosis w/fever - resolved - UA neg, possible R bibasilar atelectasis vs aspiration pna - PCT elevated 0.76 Cont total of 5 days unasyn (WBC seen on sputum Gram stain, cx pending).  Blood cx negative.    Acute encephalopathy - was initially likely 2/2 sedation and or acidosis, now appears to be in benzo withdrawal.   Tremor, agitation, tachypnea, tachycardia when off sedation.  Given hx of benzo use and severe anxiety (brother notes tremors occasionally), likely benzo withdrawal, although she has been receiving versed pushes.   Will stop propofol and start on Versed gtt for now.  When she has signs of withdrawal she becomes dyssinchronous with the vent and I would like to focus on her pulm recovery.   No seizure on EEG.  F/u CT head.    [facility] stable  P:  Trend CBC Transfuse for Hgb <7  Back pain - s/p lumbar kyphoplasty 11/20 P: F/u as outpatient. Site remains WNL Will f/u post extubation  Hx fibromyalgia,  depression, anxiety P: Continue home xanax 0.5 mgTID, restart vibryd Cont remeron for sleep.   Son updated bedside  Best Practice:  Diet:  Start TF  Pain/Anxiety/Delirium protocol (if indicated): changing to versed, fentanyl for pain. .   VAP protocol (if indicated): In place. DVT prophylaxis: SCD's / Heparin SQ GI prophylaxis: Pantoprazole Glucose control: CBG q 4 while on steroids Mobility: Bedrest. Code Status: Full. Family Communication: Rockey Situ Blanchie Serve [phone])  at bedside and updated.  States patient has  Disposition: ICU.  Labs   CBC: Recent Labs  Lab 08/29/18 0051 08/30/18 0315  WBC 11.7* 5.6  HGB 9.2* 8.6*  HCT 33.1* 31.3*  MCV 97.9 96.3  PLT 153 160   Basic Metabolic Panel: Recent Labs  Lab 08/29/18 0051 08/29/18 1559 08/30/18 0315 08/30/18 0544 08/31/18 0316 08/31/18 1420 09/01/18 0333  NA 138 139  --  135 140  --   --   K 2.8* 4.3  --  4.0 3.2*  --   --   CL 103 106  --  104 107  --   --   CO2 29 28  --  23 23  --   --   GLUCOSE 80 165*  --  165* 135*  --   --   BUN <5* 6  --  10 17  --   --   CREATININE 0.56 0.61  --  0.84 0.81  --   --   CALCIUM 6.6* 6.6*  --  6.3* 6.8*  --   --   MG 1.3*  --  2.5*  --   --  2.6* 2.6*  PHOS 1.2* 3.3 1.9*  --   --  2.1* 2.4*   GFR: Estimated Creatinine Clearance: 64.3 mL/min (by C-G formula based on SCr of 0.81 mg/dL). Recent Labs  Lab 08/29/18 0025 08/29/18 0051 08/30/18 0315 08/31/18 0316  PROCALCITON 0.76  --  0.57 0.31  WBC  --  11.7* 5.6  --    Liver Function Tests: Recent Labs  Lab 08/30/18 0856 08/31/18 0316  AST  --  16  ALT  --  10  ALKPHOS  --  61  BILITOT  --  0.7  PROT  --  5.2*  ALBUMIN 2.6* 2.5*   Echo 11/23:  - Left ventricle: The cavity size was normal. Wall thickness was   normal. Systolic function was normal. The estimated ejection   fraction was in the range of 55% to 60%. Wall motion was normal;   there were no regional wall motion abnormalities. Doppler   parameters are consistent with abnormal left ventricular   relaxation (grade 1 diastolic dysfunction). No results for input(s): LIPASE, AMYLASE in the last 168 hours. No results for input(s): AMMONIA in the last 168 hours. ABG    Component Value Date/Time   PHART 7.401 08/31/2018 1236   PCO2ART 40.7 08/31/2018 1236   PO2ART 126.0 (H) 08/31/2018 1236   HCO3 25.2 08/31/2018 1236   TCO2 26 08/31/2018 1236   ACIDBASEDEF 1.0 08/30/2018 0200   O2SAT 99.0 08/31/2018 1236    Coagulation  Profile: No results for input(s): INR, PROTIME in the last 168 hours. Cardiac Enzymes: No results for input(s): CKTOTAL, CKMB, CKMBINDEX, TROPONINI in the last 168 hours. HbA1C: Hgb A1c MFr Bld  Date/Time Value Ref Range Status  05/02/2013 12:05 AM 5.5 <5.7 % Final    Comment:    (NOTE)  According to the ADA Clinical Practice Recommendations for 2011, when HbA1c is used as a screening test:  >=6.5%   Diagnostic of Diabetes Mellitus           (if abnormal result is confirmed) 5.7-6.4%   Increased risk of developing Diabetes Mellitus References:Diagnosis and Classification of Diabetes Mellitus,Diabetes Care,2011,34(Suppl 1):S62-S69 and Standards of Medical Care in         Diabetes - 2011,Diabetes Care,2011,34 (Suppl 1):S11-S61.   CBG: Recent Labs  Lab 08/31/18 1548 08/31/18 2020 08/31/18 2348 09/01/18 0408 09/01/18 0800  GLUCAP 119* 148* 132* 154* 125*   The patient is critically ill with multiple organ systems failure and requires high complexity decision making for assessment and support, frequent evaluation and titration of therapies, application of advanced monitoring technologies and extensive interpretation of multiple databases.   Critical Care Time devoted to patient care services described in this note is  34  Minutes. This time reflects time of care of this signee Dr Koren Bound. This critical care time does not reflect procedure time, or teaching time or supervisory time of PA/NP/Med student/Med Resident etc but could involve care discussion time.  Alyson Reedy, M.D. [facility] Pulmonary/Critical Care Medicine. Pager: [phone]. After hours pager: [phone].

## 2018-09-02 ENCOUNTER — Inpatient Hospital Stay (HOSPITAL_COMMUNITY): Payer: Medicare Other

## 2018-09-02 DIAGNOSIS — I10 Essential (primary) hypertension: Secondary | ICD-10-CM

## 2018-09-02 LAB — TRIGLYCERIDES: TRIGLYCERIDES: 88 mg/dL (ref <150)

## 2018-09-02 LAB — MAGNESIUM: Magnesium: 2.2 mg/dL (ref 1.7–2.4)

## 2018-09-02 LAB — BASIC METABOLIC PANEL
Anion gap: 8 (ref 5–15)
BUN: 24 mg/dL — ABNORMAL HIGH (ref 6–20)
CHLORIDE: 101 mmol/L (ref 98–111)
CO2: 35 mmol/L — AB (ref 22–32)
CREATININE: 0.73 mg/dL (ref 0.44–1.00)
Calcium: 6.7 mg/dL — ABNORMAL LOW (ref 8.9–10.3)
GFR calc non Af Amer: >60 mL/min (ref >60)
Glucose, Bld: 153 mg/dL — ABNORMAL HIGH (ref 70–99)
Potassium: 3.6 mmol/L (ref 3.5–5.1)
Sodium: 144 mmol/L (ref 135–145)

## 2018-09-02 LAB — POCT I-STAT 3, ART BLOOD GAS (G3+)
Acid-Base Excess: 13 mmol/L — ABNORMAL HIGH (ref 0.0–2.0)
Acid-Base Excess: 15 mmol/L — ABNORMAL HIGH (ref 0.0–2.0)
Bicarbonate: 40.7 mmol/L — ABNORMAL HIGH (ref 20.0–28.0)
Bicarbonate: 41.1 mmol/L — ABNORMAL HIGH (ref 20.0–28.0)
O2 SAT: 94 %
O2 Saturation: 94 %
PCO2 ART: 61.2 mmHg — AB (ref 32.0–48.0)
PH ART: 7.338 — AB (ref 7.350–7.450)
PH ART: 7.437 (ref 7.350–7.450)
Patient temperature: 99.6
TCO2: 43 mmol/L — AB (ref 22–32)
TCO2: 43 mmol/L — ABNORMAL HIGH (ref 22–32)
pCO2 arterial: 75.7 mmHg (ref 32.0–48.0)
pO2, Arterial: 80 mmHg — ABNORMAL LOW (ref 83.0–108.0)
pO2, Arterial: 71 mmHg — ABNORMAL LOW (ref 83.0–108.0)

## 2018-09-02 LAB — GLUCOSE, CAPILLARY
GLUCOSE-CAPILLARY: 145 mg/dL — AB (ref 70–99)
GLUCOSE-CAPILLARY: 154 mg/dL — AB (ref 70–99)
GLUCOSE-CAPILLARY: 156 mg/dL — AB (ref 70–99)
GLUCOSE-CAPILLARY: 158 mg/dL — AB (ref 70–99)
GLUCOSE-CAPILLARY: 177 mg/dL — AB (ref 70–99)
Glucose-Capillary: 140 mg/dL — ABNORMAL HIGH (ref 70–99)
Glucose-Capillary: 177 mg/dL — ABNORMAL HIGH (ref 70–99)

## 2018-09-02 LAB — CBC
HEMATOCRIT: 32.5 % — AB (ref 36.0–46.0)
HEMOGLOBIN: 8.9 g/dL — AB (ref 12.0–15.0)
MCH: 25.6 pg — ABNORMAL LOW (ref 26.0–34.0)
MCHC: 27.4 g/dL — AB (ref 30.0–36.0)
MCV: 93.7 fL (ref 80.0–100.0)
Platelets: 228 10*3/uL (ref 150–400)
RBC: 3.47 MIL/uL — ABNORMAL LOW (ref 3.87–5.11)
RDW: 14.1 % (ref 11.5–15.5)
WBC: 16.6 10*3/uL — AB (ref 4.0–10.5)
nRBC: 0 % (ref 0.0–0.2)

## 2018-09-02 LAB — PHOSPHORUS: PHOSPHORUS: 2 mg/dL — AB (ref 2.5–4.6)

## 2018-09-02 MED ORDER — FUROSEMIDE 10 MG/ML IJ SOLN
40.0000 mg | Freq: Four times a day (QID) | INTRAMUSCULAR | Status: AC
  Administered 2018-09-02 (×3): 40 mg via INTRAVENOUS
  Filled 2018-09-02 (×3): qty 4

## 2018-09-02 MED ORDER — DEXMEDETOMIDINE HCL IN NACL 400 MCG/100ML IV SOLN
0.4000 ug/kg/h | INTRAVENOUS | Status: DC
  Administered 2018-09-02: 1 ug/kg/h via INTRAVENOUS
  Administered 2018-09-02: 1.2 ug/kg/h via INTRAVENOUS
  Administered 2018-09-02: 0.4 ug/kg/h via INTRAVENOUS
  Administered 2018-09-03 (×2): 1.2 ug/kg/h via INTRAVENOUS
  Filled 2018-09-02 (×5): qty 100

## 2018-09-02 MED ORDER — POTASSIUM CHLORIDE 20 MEQ/15ML (10%) PO SOLN
40.0000 meq | Freq: Three times a day (TID) | ORAL | Status: AC
  Administered 2018-09-02 (×2): 40 meq
  Filled 2018-09-02 (×2): qty 30

## 2018-09-02 MED ORDER — SODIUM CHLORIDE 0.9 % IV SOLN
1.0000 mg | Freq: Once | INTRAVENOUS | Status: AC
  Administered 2018-09-02: 1 mg via INTRAVENOUS
  Filled 2018-09-02: qty 0.2

## 2018-09-02 MED ORDER — INSULIN ASPART 100 UNIT/ML ~~LOC~~ SOLN
0.0000 [IU] | SUBCUTANEOUS | Status: DC
  Administered 2018-09-02 (×2): 2 [IU] via SUBCUTANEOUS
  Administered 2018-09-03: 1 [IU] via SUBCUTANEOUS
  Administered 2018-09-03: 2 [IU] via SUBCUTANEOUS
  Administered 2018-09-05: 1 [IU] via SUBCUTANEOUS

## 2018-09-02 MED ORDER — SODIUM CHLORIDE 0.9 % IV SOLN
0.5000 mg/h | INTRAVENOUS | Status: DC
  Administered 2018-09-02: 10 mg/h via INTRAVENOUS
  Filled 2018-09-02: qty 20

## 2018-09-02 MED ORDER — DEXMEDETOMIDINE HCL IN NACL 200 MCG/50ML IV SOLN
0.4000 ug/kg/h | INTRAVENOUS | Status: DC
  Filled 2018-09-02: qty 50

## 2018-09-02 MED ORDER — THIAMINE HCL 100 MG/ML IJ SOLN
100.0000 mg | Freq: Every day | INTRAMUSCULAR | Status: DC
  Administered 2018-09-02 – 2018-09-07 (×6): 100 mg via INTRAVENOUS
  Filled 2018-09-02 (×6): qty 2

## 2018-09-02 NOTE — Progress Notes (Signed)
Overnight patient easily agitated with minimal stimuli despite max dose on fentanyl gtt and versed gtt. During which HR increased to 130's-150's and oxygen saturation would drop into the middle 70's occasionally 60's. Patient oxygen and HR returned to normal within about 5-6 minutes after these periods of agitation.

## 2018-09-02 NOTE — Progress Notes (Signed)
Notified nurse of critical CO2 value.

## 2018-09-02 NOTE — Progress Notes (Signed)
Post ventilator change ABG results obtained.  Will call results to MD.  No further changes at this time.     Ref. Range 09/02/2018 11:22  Sample type Unknown ARTERIAL  pH, Arterial Latest Ref Range: 7.350 - 7.450  7.437  pCO2 arterial Latest Ref Range: 32.0 - 48.0 mmHg 61.2 (H)  pO2, Arterial Latest Ref Range: 83.0 - 108.0 mmHg 71.0 (L)  TCO2 Latest Ref Range: 22 - 32 mmol/L 43 (H)  Acid-Base Excess Latest Ref Range: 0.0 - 2.0 mmol/L 15.0 (H)  Bicarbonate Latest Ref Range: 20.0 - 28.0 mmol/L 41.1 (H)  O2 Saturation Latest Units: % 94.0  Patient temperature Unknown 99.6 F  Collection site Unknown RADIAL, ALLEN'S TEST ACCEPTABLE

## 2018-09-02 NOTE — Progress Notes (Signed)
Ventilator changes made by Dr. Molli KnockYacoub at [phone].  Patient placed on pressure control of 15, respiratory rate decreased to 15.  Patient mechanics improved with ventilator changes.  Per MD, will obtain an ABG on these settings once patient is sedated adequately.  Will continue to monitor.

## 2018-09-02 NOTE — Progress Notes (Signed)
NAME:  Sarah Chan, MRN:  [id number], DOB:  11-26-1965, LOS: 5 ADMISSION DATE:  08/28/2018, CONSULTATION DATE:  08/29/18 REFERRING MD:  Lanier Prude  CHIEF COMPLAINT:  Respiratory Failure   Brief History   Sarah Chan is a 52 y.o. female who was transferred from [facility] with acute on chronic hypercapnic and hypoxemic respiratory failure requiring mechanical ventilation.  Had just had lumbar kyphoplasty 1 day prior.  Past Medical History  COPD on home O2, GERD, HTN, fibromyalgia, anxiety, depression.  Significant Hospital Events   11/21 > presented to [facility], intubated and transferred to [facility] Reported fever of 101.9 11/24 Episodes of coughing, tachypnea, tremors Resolve with versed.  Resp status improving.   Consults:  PCCM  Procedures:  ETT 11/21>>>  Significant Diagnostic Tests:  CXR 11/21 > emphysema. CT head 11/24  Micro Data:  Blood 11/21 >  Sputum 11/21 >  Urine 11/21 >  MRSA PCR >> neg  Antimicrobials:  11/22 unasyn >>  Interim history/subjective:  Very agitated overnight, vent dyssynchrony   Objective:  Blood pressure (!) 134/93, pulse (!) 140, temperature 99.6 F (37.6 C), temperature source Oral, resp. rate (!) 23, height 5\' 2"  (1.575 m), weight 54.7 kg, SpO2 92 %.    Vent Mode: PRVC FiO2 (%):  [30 %] 30 % Set Rate:  [20 bmp] 20 bmp Vt Set:  [400 mL] 400 mL PEEP:  [5 cmH20] 5 cmH20 Pressure Support:  [8 cmH20] 8 cmH20 Plateau Pressure:  [16 cmH20-32 cmH20] 17 cmH20   Intake/Output Summary (Last 24 hours) at 09/02/2018 0903 Last data filed at 09/02/2018 0800 Gross per 24 hour  Intake 2373.55 ml  Output 4280 ml  Net -1906.45 ml   Filed Weights   08/31/18 0530 09/01/18 0530 09/02/18 0500  Weight: 55.1 kg 57.5 kg 54.7 kg    Examination: General:  Acutely ill appearing female, NAD HEENT: [place]/AT, PERRL, EOM-I and MMM Neuro: Awake but agitated CV: Sinus tach, Nl S1/S2 and -M/R/G PULM: Decreased BS diffusely GI: Soft, NT, ND and  +BS Extremities: warm/dry, no edema, back dressing w/steristrips dry, 2+ edema Skin: Bruises noted  Assessment & Plan:   Acute on chronic hypercapnic and hypoxemic respiratory failure  COPD exacerbation, acute.  Cont steroids, CAP abtx coverage (though no discrete consolidation on CXR) Budesonide / Rosalyn Gess, Duonebs D/c unasyn after 5 days (Tuesday will be day 5) Change to PCV Active diureses today CXR and ABG in AM  Leukocytosis w/fever - resolved - UA neg, possible R bibasilar atelectasis vs aspiration pna - PCT elevated 0.76 Cont total of 5 days unasyn (WBC seen on sputum Gram stain, cx pending).  Blood cx negative.  D/C after today's dose  Acute encephalopathy - was initially likely 2/2 sedation and or acidosis, now appears to be in benzo withdrawal.   Tremor, agitation, tachypnea, tachycardia when off sedation.  Given hx of benzo use and severe anxiety (brother notes tremors occasionally), likely benzo withdrawal, although she has been receiving versed pushes.   No seizure on EEG.  F/u CT head.   D/C versed drip Precedex drip ordered Fentanyl drip If remains tachycardic after precedex then will consider lopressor  Anemia (Labs from [facility] with Hgb 9.3), stable  P:  Trend CBC Transfuse for Hgb <7  Back pain - s/p lumbar kyphoplasty 11/20 P: F/u as outpatient. Site remains WNL  Hx fibromyalgia, depression, anxiety P: Continue home xanax 0.5 mgTID PRN, restart vibryd Cont remeron for sleep.   No family bedside to update  Etoh  history:  - Thiamine - Folate - Precedex drip  Best Practice:  Diet:  TF  Pain/Anxiety/Delirium protocol (if indicated): changing to versed, fentanyl for pain. .   VAP protocol (if indicated): In place. DVT prophylaxis: SCD's / Heparin SQ GI prophylaxis: Pantoprazole Glucose control: CBG q 4 while on steroids Mobility: Bedrest. Code Status: Full. Family Communication: Rockey SituBrother, Blanchie ServeRandy Huff [phone]) at bedside and updated.  States  patient has  Disposition: ICU.  Labs   CBC: Recent Labs  Lab 08/29/18 0051 08/30/18 0315 09/01/18 1144 09/02/18 0805  WBC 11.7* 5.6 20.4* 16.6*  HGB 9.2* 8.6* 9.9* 8.9*  HCT 33.1* 31.3* 35.2* 32.5*  MCV 97.9 96.3 93.4 93.7  PLT 153 160 268 228   Basic Metabolic Panel: Recent Labs  Lab 08/29/18 1559  08/30/18 0544 08/31/18 0316 08/31/18 1420 09/01/18 0333 09/01/18 1144 09/01/18 1701 09/02/18 0805  NA 139  --  135 140  --   --  144  --  144  K 4.3  --  4.0 3.2*  --   --  3.9  --  3.6  CL 106  --  104 107  --   --  110  --  101  CO2 28  --  23 23  --   --  29  --  35*  GLUCOSE 165*  --  165* 135*  --   --  174*  --  153*  BUN 6  --  10 17  --   --  20  --  24*  CREATININE 0.61  --  0.84 0.81  --   --  0.65  --  0.73  CALCIUM 6.6*  --  6.3* 6.8*  --   --  6.8*  --  6.7*  MG  --    < >  --   --  2.6* 2.6* 2.4 2.3 2.2  PHOS 3.3   < >  --   --  2.1* 2.4* 2.1* 2.8 2.0*   < > = values in this interval not displayed.   GFR: Estimated Creatinine Clearance: 65.1 mL/min (by C-G formula based on SCr of 0.73 mg/dL). Recent Labs  Lab 08/29/18 0025 08/29/18 0051 08/30/18 0315 08/31/18 0316 09/01/18 1144 09/02/18 0805  PROCALCITON 0.76  --  0.57 0.31  --   --   WBC  --  11.7* 5.6  --  20.4* 16.6*   Liver Function Tests: Recent Labs  Lab 08/30/18 0856 08/31/18 0316  AST  --  16  ALT  --  10  ALKPHOS  --  61  BILITOT  --  0.7  PROT  --  5.2*  ALBUMIN 2.6* 2.5*   Echo 11/23:  - Left ventricle: The cavity size was normal. Wall thickness was   normal. Systolic function was normal. The estimated ejection   fraction was in the range of 55% to 60%. Wall motion was normal;   there were no regional wall motion abnormalities. Doppler   parameters are consistent with abnormal left ventricular   relaxation (grade 1 diastolic dysfunction). No results for input(s): LIPASE, AMYLASE in the last 168 hours. No results for input(s): AMMONIA in the last 168 hours. ABG      Component Value Date/Time   PHART 7.338 (L) 09/02/2018 0521   PCO2ART 75.7 (HH) 09/02/2018 0521   PO2ART 80.0 (L) 09/02/2018 0521   HCO3 40.7 (H) 09/02/2018 0521   TCO2 43 (H) 09/02/2018 0521   ACIDBASEDEF 1.0 08/30/2018 0200   O2SAT 94.0 09/02/2018 0521  Coagulation Profile: No results for input(s): INR, PROTIME in the last 168 hours. Cardiac Enzymes: No results for input(s): CKTOTAL, CKMB, CKMBINDEX, TROPONINI in the last 168 hours. HbA1C: Hgb A1c MFr Bld  Date/Time Value Ref Range Status  05/02/2013 12:05 AM 5.5 <5.7 % Final    Comment:    (NOTE)                                                                       According to the ADA Clinical Practice Recommendations for 2011, when HbA1c is used as a screening test:  >=6.5%   Diagnostic of Diabetes Mellitus           (if abnormal result is confirmed) 5.7-6.4%   Increased risk of developing Diabetes Mellitus References:Diagnosis and Classification of Diabetes Mellitus,Diabetes Care,2011,34(Suppl 1):S62-S69 and Standards of Medical Care in         Diabetes - 2011,Diabetes Care,2011,34 (Suppl 1):S11-S61.   CBG: Recent Labs  Lab 09/01/18 1558 09/01/18 1925 09/02/18 0003 09/02/18 0404 09/02/18 0740  GLUCAP 140* 93 156* 158* 140*   The patient is critically ill with multiple organ systems failure and requires high complexity decision making for assessment and support, frequent evaluation and titration of therapies, application of advanced monitoring technologies and extensive interpretation of multiple databases.   Critical Care Time devoted to patient care services described in this note is  34  Minutes. This time reflects time of care of this signee Dr Koren Bound. This critical care time does not reflect procedure time, or teaching time or supervisory time of PA/NP/Med student/Med Resident etc but could involve care discussion time.  Alyson Reedy, M.D. [facility] Pulmonary/Critical Care Medicine. Pager:  [phone]. After hours pager: [phone].

## 2018-09-02 NOTE — Progress Notes (Signed)
eLink Physician-Brief Progress Note Patient Name: Sarah RandRhonda H Carusone DOB: March 22, 1966 MRN: [id number]   Date of Service  09/02/2018  HPI/Events of Note  Hyperglycemia - Blood glucose = 145 --> 177--> 151.  eICU Interventions  Will order: 1. Q 4 hour sensitive Novolog SSI.      Intervention Category Major Interventions: Hyperglycemia - active titration of insulin therapy  Lenell AntuSommer,Leslee Haueter [place] 09/02/2018, 8:33 PM

## 2018-09-03 ENCOUNTER — Other Ambulatory Visit: Payer: Self-pay

## 2018-09-03 ENCOUNTER — Encounter (HOSPITAL_COMMUNITY): Payer: Self-pay

## 2018-09-03 ENCOUNTER — Inpatient Hospital Stay (HOSPITAL_COMMUNITY): Payer: Medicare Other

## 2018-09-03 DIAGNOSIS — R000 Tachycardia, unspecified: Secondary | ICD-10-CM

## 2018-09-03 DIAGNOSIS — L8990 Pressure ulcer of unspecified site, unspecified stage: Secondary | ICD-10-CM

## 2018-09-03 LAB — CBC
HEMATOCRIT: 33.9 % — AB (ref 36.0–46.0)
Hemoglobin: 9.1 g/dL — ABNORMAL LOW (ref 12.0–15.0)
MCH: 25.3 pg — AB (ref 26.0–34.0)
MCHC: 26.8 g/dL — ABNORMAL LOW (ref 30.0–36.0)
MCV: 94.4 fL (ref 80.0–100.0)
NRBC: 0 % (ref 0.0–0.2)
Platelets: 219 10*3/uL (ref 150–400)
RBC: 3.59 MIL/uL — AB (ref 3.87–5.11)
RDW: 13.9 % (ref 11.5–15.5)
WBC: 15.4 10*3/uL — ABNORMAL HIGH (ref 4.0–10.5)

## 2018-09-03 LAB — CULTURE, BLOOD (ROUTINE X 2)
CULTURE: NO GROWTH
CULTURE: NO GROWTH
Special Requests: ADEQUATE

## 2018-09-03 LAB — BASIC METABOLIC PANEL
ANION GAP: 6 (ref 5–15)
BUN: 27 mg/dL — ABNORMAL HIGH (ref 6–20)
CO2: 35 mmol/L — ABNORMAL HIGH (ref 22–32)
Calcium: 6.7 mg/dL — ABNORMAL LOW (ref 8.9–10.3)
Chloride: 100 mmol/L (ref 98–111)
Creatinine, Ser: 0.6 mg/dL (ref 0.44–1.00)
GFR calc Af Amer: >60 mL/min (ref >60)
GFR calc non Af Amer: >60 mL/min (ref >60)
GLUCOSE: 163 mg/dL — AB (ref 70–99)
POTASSIUM: 4.6 mmol/L (ref 3.5–5.1)
Sodium: 141 mmol/L (ref 135–145)

## 2018-09-03 LAB — POCT I-STAT 3, ART BLOOD GAS (G3+)
Acid-Base Excess: 15 mmol/L — ABNORMAL HIGH (ref 0.0–2.0)
Bicarbonate: 41.8 mmol/L — ABNORMAL HIGH (ref 20.0–28.0)
O2 Saturation: 95 %
PH ART: 7.404 (ref 7.350–7.450)
Patient temperature: 98.6
TCO2: 44 mmol/L — ABNORMAL HIGH (ref 22–32)
pCO2 arterial: 66.7 mmHg (ref 32.0–48.0)
pO2, Arterial: 81 mmHg — ABNORMAL LOW (ref 83.0–108.0)

## 2018-09-03 LAB — GLUCOSE, CAPILLARY
GLUCOSE-CAPILLARY: 156 mg/dL — AB (ref 70–99)
Glucose-Capillary: 118 mg/dL — ABNORMAL HIGH (ref 70–99)
Glucose-Capillary: 121 mg/dL — ABNORMAL HIGH (ref 70–99)
Glucose-Capillary: 108 mg/dL — ABNORMAL HIGH (ref 70–99)
Glucose-Capillary: 94 mg/dL (ref 70–99)

## 2018-09-03 LAB — HIV 1/2 AB DIFFERENTIATION
HIV 1 AB: NEGATIVE
HIV 2 Ab: NEGATIVE
NOTE (HIV CONF MULTISPOT): NEGATIVE

## 2018-09-03 LAB — HIV ANTIBODY (ROUTINE TESTING W REFLEX): HIV Screen 4th Generation wRfx: REACTIVE — AB

## 2018-09-03 LAB — RNA QUALITATIVE

## 2018-09-03 LAB — MAGNESIUM: Magnesium: 1.9 mg/dL (ref 1.7–2.4)

## 2018-09-03 LAB — PHOSPHORUS: Phosphorus: 2.8 mg/dL (ref 2.5–4.6)

## 2018-09-03 MED ORDER — FUROSEMIDE 10 MG/ML IJ SOLN
40.0000 mg | Freq: Four times a day (QID) | INTRAMUSCULAR | Status: AC
  Administered 2018-09-03 – 2018-09-04 (×3): 40 mg via INTRAVENOUS
  Filled 2018-09-03 (×3): qty 4

## 2018-09-03 MED ORDER — ORAL CARE MOUTH RINSE
15.0000 mL | Freq: Two times a day (BID) | OROMUCOSAL | Status: DC
  Administered 2018-09-03 – 2018-09-05 (×5): 15 mL via OROMUCOSAL

## 2018-09-03 NOTE — Evaluation (Signed)
Clinical/Bedside Swallow Evaluation Patient Details  Name: Sarah Chan MRN: [id number] Date of Birth: May 21, 1966  Today's Date: 09/03/2018 Time: SLP Start Time (ACUTE ONLY): 1500 SLP Stop Time (ACUTE ONLY): 1515 SLP Time Calculation (min) (ACUTE ONLY): 15 min  Past Medical History:  Past Medical History:  Diagnosis Date  . Anxiety   . Back pain   . COPD (chronic obstructive pulmonary disease) (HCC)    Oxygen dependent  . Depression   . Fibromyalgia   . GERD (gastroesophageal reflux disease)   . HTN (hypertension), malignant 10/27/2014  . IBS (irritable bowel syndrome)   . Neck pain   . Pneumonia   . Takotsubo cardiomyopathy    a. NSTEMI 04/2013 secondary to Takotsubo's CM - normal cors, EF 30%.   Past Surgical History:  Past Surgical History:  Procedure Laterality Date  . ABDOMINAL HYSTERECTOMY    . C-Secition    . CARDIAC CATHETERIZATION    . CHOLECYSTECTOMY    . LEFT HEART CATHETERIZATION WITH CORONARY ANGIOGRAM N/A 05/04/2013   Procedure: LEFT HEART CATHETERIZATION WITH CORONARY ANGIOGRAM;  Surgeon: Wendall StadePeter C Nishan, MD;  [facility];  Service: Cardiovascular;  Laterality: N/A;   HPI:  Sarah Chan is a 52 y.o. female [facility] on chronic hypercapnic and hypoxemic respiratory failure requiring mechanical ventilation.  Had just had lumbar kyphoplasty 1 day prior. Intubated from 11/21-11/27   Assessment / Plan / Recommendation Clinical Impression  Pt demonstrates subtle signs of volume dependent aspiration following 7 day intubation (cough on 1/10 trials). Given baseline COPD and generalized weakness would advise pt consume pills whole in puree and take only ice for oral hydration over the next 12 hours. Will f/u tomorrow for further trials as pt may be capable of advancing diet with more time following extubation.  SLP Visit Diagnosis: Dysphagia, oropharyngeal phase (R13.12)    Aspiration Risk  Moderate aspiration risk    Diet Recommendation NPO except meds;Ice chips PRN after oral care   Medication Administration: Whole meds with puree    Other  Recommendations     Follow up Recommendations Skilled Nursing facility      Frequency and Duration min 2x/week  2 weeks       Prognosis Prognosis for Safe Diet Advancement: Good Barriers to Reach Goals: Severity of deficits      Swallow Study   General HPI: Sarah Chan is a 52 y.o. female who [facility] with acute on chronic hypercapnic and hypoxemic respiratory failure requiring mechanical ventilation.  Had just had lumbar kyphoplasty 1 day prior. Intubated from 11/21-11/27 Type of Study: Bedside Swallow Evaluation Diet Prior to this Study: NPO Respiratory Status: Nasal cannula History of Recent Intubation: Yes Length of Intubations (days): 6 days Date extubated: 09/03/18 Behavior/Cognition: Alert;Cooperative;Pleasant mood Oral Cavity Assessment: Within Functional Limits Oral Care Completed by SLP: No Oral Cavity - Dentition: Adequate natural dentition Vision: Functional for self-feeding Self-Feeding Abilities: Total assist Patient Positioning: Upright in bed Baseline Vocal Quality: Hoarse;Low vocal intensity Volitional Cough: Strong    Oral/Motor/Sensory Function Overall Oral Motor/Sensory Function: Within functional limits   Ice Chips Ice chips: Within functional limits Presentation: Spoon   Thin Liquid Thin Liquid: Impaired Presentation: Cup;Straw Pharyngeal  Phase Impairments: Throat Clearing - Immediate;Cough - Immediate    Nectar Thick Nectar Thick Liquid: Not tested   Honey Thick Honey Thick Liquid: Not tested   Puree Puree: Within functional limits Presentation: Spoon   Solid     Solid:  Not tested     Harlon Ditty, MA CCC-SLP  Acute Rehabilitation Services Pager [phone] Office [phone]  Claudine Mouton 09/03/2018,3:35 PM

## 2018-09-03 NOTE — Progress Notes (Signed)
220 ml fentanyl wasted in sink witnessed by Richrd PrimeJanice Coble RN.

## 2018-09-03 NOTE — Progress Notes (Signed)
NAME:  Sarah Chan, MRN:  [id number], DOB:  1966/05/21, LOS: 6 ADMISSION DATE:  08/28/2018, CONSULTATION DATE:  08/29/18 REFERRING MD:  Lanier Prude  CHIEF COMPLAINT:  Respiratory Failure   Brief History   Sarah Chan is a 52 y.o. female who was transferred from [facility] with acute on chronic hypercapnic and hypoxemic respiratory failure requiring mechanical ventilation.  Had just had lumbar kyphoplasty 1 day prior.  Past Medical History  COPD on home O2, GERD, HTN, fibromyalgia, anxiety, depression.  Significant Hospital Events   11/21 > presented to [facility], intubated and transferred to [facility]. Reported fever of 101.9 11/24 Episodes of coughing, tachypnea, tremors Resolve with versed.  Resp status improving.   Consults:  PCCM  Procedures:  ETT 11/21>>>  Significant Diagnostic Tests:  CXR 11/21 > emphysema. CT head 11/24  Micro Data:  Blood 11/21 >  Sputum 11/21 >  Urine 11/21 >  MRSA PCR >> neg  Antimicrobials:  11/22 unasyn >>  Interim history/subjective:  Diuresed well overnight Less anxious this AM  Objective:  Blood pressure 115/85, pulse 100, temperature 98.3 F (36.8 C), resp. rate 13, height 5\' 2"  (1.575 m), weight 51.6 kg, SpO2 97 %.    Vent Mode: PSV;CPAP FiO2 (%):  [35 %] 35 % Set Rate:  [15 bmp] 15 bmp PEEP:  [5 cmH20] 5 cmH20 Pressure Support:  [5 cmH20-8 cmH20] 5 cmH20 Plateau Pressure:  [7 cmH20-16 cmH20] 16 cmH20   Intake/Output Summary (Last 24 hours) at 09/03/2018 1137 Last data filed at 09/03/2018 1100 Gross per 24 hour  Intake 2178.38 ml  Output 2820 ml  Net -641.62 ml   Filed Weights   09/01/18 0530 09/02/18 0500 09/03/18 0500  Weight: 57.5 kg 54.7 kg 51.6 kg    Examination: General:  Chronically ill appearing female this AM, less anxious appearing HEENT: [place]/AT, PERRL, EOM-I and MMM Neuro: Awake and anxious but following commands CV: RRR, Nl S1/S2 and -M/R/G PULM: Coarse BS diffusely GI: Soft, NT, ND and  +BS Extremities: 1+ edema and -tenderness Skin: Bruises noted  Assessment & Plan:   Acute on chronic hypercapnic and hypoxemic respiratory failure  COPD exacerbation, acute.  Cont steroids, CAP abtx coverage (though no discrete consolidation on CXR) Budesonide / Brovana, Duonebs Completed a 5 day course of unasyn Extubate today SLP Continue diureses but at a lower dose If fails will reintubate  Leukocytosis w/fever - resolved - UA neg, possible R bibasilar atelectasis vs aspiration pna - PCT elevated 0.76 Cont total of 5 days unasyn (WBC seen on sputum Gram stain, cx pending).  Blood cx negative.  D/C after today's dose  Acute encephalopathy - was initially likely 2/2 sedation and or acidosis, now appears to be in benzo withdrawal.   Tremor, agitation, tachypnea, tachycardia when off sedation.  Given hx of benzo use and severe anxiety (brother notes tremors occasionally), likely benzo withdrawal, although she has been receiving versed pushes.   No seizure on EEG.  F/u CT head.   D/C fentanyl drip Precedex drip at half dose for anxiety Low dose lopressor if tachy after extubation  Anemia (Labs from [facility] with Hgb 9.3), stable  P:  Trend CBC Transfuse for Hgb <7  Back pain - s/p lumbar kyphoplasty 11/20 P: F/u as outpatient. Site remains WNL  Hx fibromyalgia, depression, anxiety P: Continue home xanax 0.5 mgTID PRN, restart vibryd Cont remeron for sleep.   Family updated bedside  Etoh history:  - Thiamine - Folate - Precedex drip at half dose  Best Practice:  Diet:  TF  Pain/Anxiety/Delirium protocol (if indicated): changing to versed, fentanyl for pain. .   VAP protocol (if indicated): In place. DVT prophylaxis: SCD's / Heparin SQ GI prophylaxis: Pantoprazole Glucose control: CBG q 4 while on steroids Mobility: Bedrest. Code Status: Full. Family Communication: Rockey Situ Blanchie Serve [phone]) at bedside and updated.  States patient has  Disposition:  ICU.  Labs   CBC: Recent Labs  Lab 08/29/18 0051 08/30/18 0315 09/01/18 1144 09/02/18 0805 09/03/18 0300  WBC 11.7* 5.6 20.4* 16.6* 15.4*  HGB 9.2* 8.6* 9.9* 8.9* 9.1*  HCT 33.1* 31.3* 35.2* 32.5* 33.9*  MCV 97.9 96.3 93.4 93.7 94.4  PLT 153 160 268 228 219   Basic Metabolic Panel: Recent Labs  Lab 08/30/18 0544 08/31/18 0316  09/01/18 0333 09/01/18 1144 09/01/18 1701 09/02/18 0805 09/03/18 0300  NA 135 140  --   --  144  --  144 141  K 4.0 3.2*  --   --  3.9  --  3.6 4.6  CL 104 107  --   --  110  --  101 100  CO2 23 23  --   --  29  --  35* 35*  GLUCOSE 165* 135*  --   --  174*  --  153* 163*  BUN 10 17  --   --  20  --  24* 27*  CREATININE 0.84 0.81  --   --  0.65  --  0.73 0.60  CALCIUM 6.3* 6.8*  --   --  6.8*  --  6.7* 6.7*  MG  --   --    < > 2.6* 2.4 2.3 2.2 1.9  PHOS  --   --    < > 2.4* 2.1* 2.8 2.0* 2.8   < > = values in this interval not displayed.   GFR: Estimated Creatinine Clearance: 65.1 mL/min (by C-G formula based on SCr of 0.6 mg/dL). Recent Labs  Lab 08/29/18 0025  08/30/18 0315 08/31/18 0316 09/01/18 1144 09/02/18 0805 09/03/18 0300  PROCALCITON 0.76  --  0.57 0.31  --   --   --   WBC  --    < > 5.6  --  20.4* 16.6* 15.4*   < > = values in this interval not displayed.   Liver Function Tests: Recent Labs  Lab 08/30/18 0856 08/31/18 0316  AST  --  16  ALT  --  10  ALKPHOS  --  61  BILITOT  --  0.7  PROT  --  5.2*  ALBUMIN 2.6* 2.5*   Echo 11/23:  - Left ventricle: The cavity size was normal. Wall thickness was   normal. Systolic function was normal. The estimated ejection   fraction was in the range of 55% to 60%. Wall motion was normal;   there were no regional wall motion abnormalities. Doppler   parameters are consistent with abnormal left ventricular   relaxation (grade 1 diastolic dysfunction). No results for input(s): LIPASE, AMYLASE in the last 168 hours. No results for input(s): AMMONIA in the last 168 hours. ABG     Component Value Date/Time   PHART 7.404 09/03/2018 0425   PCO2ART 66.7 (HH) 09/03/2018 0425   PO2ART 81.0 (L) 09/03/2018 0425   HCO3 41.8 (H) 09/03/2018 0425   TCO2 44 (H) 09/03/2018 0425   ACIDBASEDEF 1.0 08/30/2018 0200   O2SAT 95.0 09/03/2018 0425    Coagulation Profile: No results for input(s): INR, PROTIME in the last 168 hours.  Cardiac Enzymes: No results for input(s): CKTOTAL, CKMB, CKMBINDEX, TROPONINI in the last 168 hours. HbA1C: Hgb A1c MFr Bld  Date/Time Value Ref Range Status  05/02/2013 12:05 AM 5.5 <5.7 % Final    Comment:    (NOTE)                                                                       According to the ADA Clinical Practice Recommendations for 2011, when HbA1c is used as a screening test:  >=6.5%   Diagnostic of Diabetes Mellitus           (if abnormal result is confirmed) 5.7-6.4%   Increased risk of developing Diabetes Mellitus References:Diagnosis and Classification of Diabetes Mellitus,Diabetes Care,2011,34(Suppl 1):S62-S69 and Standards of Medical Care in         Diabetes - 2011,Diabetes Care,2011,34 (Suppl 1):S11-S61.   CBG: Recent Labs  Lab 09/02/18 1547 09/02/18 1946 09/02/18 2313 09/03/18 0254 09/03/18 0745  GLUCAP 177* 154* 177* 156* 118*   The patient is critically ill with multiple organ systems failure and requires high complexity decision making for assessment and support, frequent evaluation and titration of therapies, application of advanced monitoring technologies and extensive interpretation of multiple databases.   Critical Care Time devoted to patient care services described in this note is  33  Minutes. This time reflects time of care of this signee Dr Koren BoundWesam Zea Kostka. This critical care time does not reflect procedure time, or teaching time or supervisory time of PA/NP/Med student/Med Resident etc but could involve care discussion time.  Alyson ReedyWesam G. Jontez Redfield, M.D. [facility]Critical [facility]. Pager:  [phone]. After hours pager: [phone].

## 2018-09-03 NOTE — Procedures (Signed)
Extubation Procedure Note  Patient Details:   Name: Sarah Chan DOB: [birth year] MRN: [id number]   Airway Documentation:    Vent end date: 09/03/18 Vent end time: 1210   Evaluation  O2 sats: stable throughout Complications: No apparent complications Patient did tolerate procedure well. Bilateral Breath Sounds: Clear   Yes   Patient extubated to 3L nasal cannula.  Positive cuff leak noted.  No evidence of stridor.  Patient able to speak post extubation.  Sats currently 96%.  Vitals are stable.  Incentive spirometry performed x10 with achieved goal of 500.  No complications noted at this time.    Durwin GlazeBrown, Nickalus Thornsberry N 09/03/2018, 12:48 PM

## 2018-09-03 NOTE — Progress Notes (Signed)
RT note: patient placed on CPAP/PSV of 5/5 at 0912 and is currently tolerating well.  Will continue to monitor.

## 2018-09-04 LAB — MAGNESIUM
MAGNESIUM: 2.1 mg/dL (ref 1.7–2.4)
Magnesium: 0.9 mg/dL — CL (ref 1.7–2.4)

## 2018-09-04 LAB — GLUCOSE, CAPILLARY
GLUCOSE-CAPILLARY: 101 mg/dL — AB (ref 70–99)
GLUCOSE-CAPILLARY: 104 mg/dL — AB (ref 70–99)
GLUCOSE-CAPILLARY: 115 mg/dL — AB (ref 70–99)
GLUCOSE-CAPILLARY: 94 mg/dL (ref 70–99)
GLUCOSE-CAPILLARY: 95 mg/dL (ref 70–99)
Glucose-Capillary: 94 mg/dL (ref 70–99)
Glucose-Capillary: 101 mg/dL — ABNORMAL HIGH (ref 70–99)
Glucose-Capillary: 67 mg/dL — ABNORMAL LOW (ref 70–99)

## 2018-09-04 LAB — CBC
HEMATOCRIT: 41.4 % (ref 36.0–46.0)
Hemoglobin: 12 g/dL (ref 12.0–15.0)
MCH: 25.9 pg — AB (ref 26.0–34.0)
MCHC: 29 g/dL — AB (ref 30.0–36.0)
MCV: 89.4 fL (ref 80.0–100.0)
Platelets: 223 10*3/uL (ref 150–400)
RBC: 4.63 MIL/uL (ref 3.87–5.11)
RDW: 14.3 % (ref 11.5–15.5)
WBC: 20.2 10*3/uL — ABNORMAL HIGH (ref 4.0–10.5)
nRBC: 0 % (ref 0.0–0.2)

## 2018-09-04 LAB — POCT I-STAT 3, ART BLOOD GAS (G3+)
Acid-Base Excess: 13 mmol/L — ABNORMAL HIGH (ref 0.0–2.0)
Bicarbonate: 38.9 mmol/L — ABNORMAL HIGH (ref 20.0–28.0)
O2 Saturation: 97 %
Patient temperature: 98.8
TCO2: 40 mmol/L — ABNORMAL HIGH (ref 22–32)
pCO2 arterial: 54 mmHg — ABNORMAL HIGH (ref 32.0–48.0)
pH, Arterial: 7.465 — ABNORMAL HIGH (ref 7.350–7.450)
pO2, Arterial: 88 mmHg (ref 83.0–108.0)

## 2018-09-04 LAB — BASIC METABOLIC PANEL
Anion gap: 13 (ref 5–15)
BUN: 24 mg/dL — AB (ref 6–20)
BUN: 27 mg/dL — AB (ref 6–20)
CALCIUM: 7.4 mg/dL — AB (ref 8.9–10.3)
CO2: 15 mmol/L — AB (ref 22–32)
CO2: 33 mmol/L — ABNORMAL HIGH (ref 22–32)
CREATININE: 0.69 mg/dL (ref 0.44–1.00)
Calcium: 5.3 mg/dL — CL (ref 8.9–10.3)
Chloride: 93 mmol/L — ABNORMAL LOW (ref 98–111)
Creatinine, Ser: 0.7 mg/dL (ref 0.44–1.00)
GFR calc Af Amer: >60 mL/min (ref >60)
GFR calc Af Amer: >60 mL/min (ref >60)
GFR calc non Af Amer: >60 mL/min (ref >60)
GLUCOSE: 92 mg/dL (ref 70–99)
Glucose, Bld: 87 mg/dL (ref 70–99)
POTASSIUM: 3.1 mmol/L — AB (ref 3.5–5.1)
Potassium: 3.9 mmol/L (ref 3.5–5.1)
Sodium: <102 mmol/L — CL (ref 135–145)
Sodium: 139 mmol/L (ref 135–145)

## 2018-09-04 LAB — PHOSPHORUS
Phosphorus: 2.3 mg/dL — ABNORMAL LOW (ref 2.5–4.6)
Phosphorus: 3.6 mg/dL (ref 2.5–4.6)

## 2018-09-04 MED ORDER — METOPROLOL SUCCINATE ER 25 MG PO TB24
25.0000 mg | ORAL_TABLET | Freq: Every day | ORAL | Status: DC
  Administered 2018-09-04 – 2018-09-07 (×4): 25 mg via ORAL
  Filled 2018-09-04 (×4): qty 1

## 2018-09-04 MED ORDER — PANTOPRAZOLE SODIUM 40 MG PO TBEC
40.0000 mg | DELAYED_RELEASE_TABLET | Freq: Every day | ORAL | Status: DC
  Administered 2018-09-04 – 2018-09-07 (×4): 40 mg via ORAL
  Filled 2018-09-04 (×4): qty 1

## 2018-09-04 NOTE — Progress Notes (Signed)
  Speech Language Pathology Treatment: Dysphagia  Patient Details Name: Sarah Chan MRN: [id number] DOB: January 12, 1966 Today's Date: 09/04/2018 Time: [id number] SLP Time Calculation (min) (ACUTE ONLY): 15 min  Assessment / Plan / Recommendation Clinical Impression  Pt with persisting s/s of a post-extubation dysphagia with consistent cough response elicited after consumption of thin liquids; mildly improved voice quality but still dysphonic.  Tachypneic, with RR reaching high 30s and notable accessory breathing.  Anticipate improvements with time post-extubation, but today she still likely has glottic incompetence combined with a RR that is interfering with timing of airway closure.  Aspiration risk still too high to safely start PO diet.   For today, continue meds whole with puree, ice chips PRN after oral care.  D/W pt, her brother, and Charity fundraiserN.  SLP will follow for PO readiness, possible instrumental swallow study.     HPI HPI: Sarah Chan is a 52 y.o. female [facility] acute on chronic hypercapnic and hypoxemic respiratory failure requiring mechanical ventilation.  Had just had lumbar kyphoplasty 1 day prior. Intubated from 11/21-11/27      SLP Plan  Continue with current plan of care       Recommendations  Diet recommendations: NPO Medication Administration: Whole meds with puree                Oral Care Recommendations: Oral care QID;Oral care prior to ice chip/H20 SLP Visit Diagnosis: Dysphagia, oropharyngeal phase (R13.12) Plan: Continue with current plan of care       GO                Blenda MountsCouture, Aj Crunkleton Laurice 09/04/2018, 9:40 AM  Marchelle FolksAmanda L. Samson Fredericouture, MA CCC/SLP Acute Rehabilitation Services Office number [phone] Pager [phone]

## 2018-09-04 NOTE — Progress Notes (Signed)
Ambulation was unsuccessful. Patient became SOB on 2L [place] when attempted to ambulate. States that she feels dizzy and was shaking. HR sustained in the 140's while standing. At rest HR ranged from 100's-120's lying in bed.  Will continue to monitor.

## 2018-09-04 NOTE — Progress Notes (Signed)
NAME:  Sarah RandRhonda H Steinkamp, MRN:  [id number], DOB:  12/09/1965, LOS: 7 ADMISSION DATE:  08/28/2018, CONSULTATION DATE:  08/29/18 REFERRING MD:  Lanier [facility]udeUNC [place]  CHIEF COMPLAINT:  Respiratory Failure   Brief History   Sarah Chan is a 52 y.o. female [facility] on chronic hypercapnic and hypoxemic respiratory failure requiring mechanical ventilation.  Had just had lumbar kyphoplasty 1 day prior.  Past Medical History  COPD on home O2, GERD, HTN, fibromyalgia, anxiety, depression.  Significant Hospital Events   [facility] Reported fever of 101.9 11/24 Episodes of coughing, tachypnea, tremors Resolve with versed.  Resp status improving.   11/27 - Diuresed well overnight. Less anxious this AM  Consults:  PCCM  Procedures:  ETT 11/21>>> 11/27  Significant Diagnostic Tests:  CXR 11/21 > emphysema. CT head 11/24 - nil acute  Micro Data:  Blood 11/21 >  Sputum 11/21 >  Normal flora Urine 11/21 >  MRSA PCR >> neg Fu PCR 11/22 - neg HIV 11/22 Results for Sarah RandBRUMFIELD, Keirah H (MRN [id number]) as of 09/04/2018 16:10  Ref. Range 08/29/2018 00:25  HIV Screen 4th Generation wRfx Latest Ref Range: Non Reactive  Reactive (A)  HIV 1 Ab Latest Ref Range: Negative  Negative  HIV 2 Ab Latest Ref Range: Negative  Negative  Note Unknown Negative   Antimicrobials:  11/22 unasyn >>  Interim history/subjective:    11/28 - extubated yesterday. Dyspneic even at rest. Needing xanax prn for anxiety  HR 120s and in sinus. BP ok . Reports being on bipap qhs at home. Did not get it last night. Feels deconditioned and not at baseline but better since admit  Speech recommended NPO but for meds with apple sauce  Objective:  Blood pressure 121/86, pulse (!) 115, temperature 98.8 F (37.1 C), temperature source Oral, resp. rate (!) 24, height 5\' 2"  (1.575 m), weight 46.7 kg, SpO2 99 %.        Intake/Output  Summary (Last 24 hours) at 09/04/2018 1609 Last data filed at 09/04/2018 1400 Gross per 24 hour  Intake -  Output 2640 ml  Net -2640 ml   Filed Weights   09/02/18 0500 09/03/18 0500 09/04/18 0500  Weight: 54.7 kg 51.6 kg 46.7 kg    General Appearance:  Frail, thin, Sitting in bed, Looks very deconditioned  Head:  Normocephalic, without obvious abnormality, atraumatic Eyes:  PERRL - yes, conjunctiva/corneas - clear     Ears:  Normal external ear canals, both ears Nose:  G tube - no but has 2L [place] Throat:  ETT TUBE - no , OG tube - no Neck:  Supple,  No enlargement/tenderness/nodules Lungs: Barrell Chest +, Purse Lip Breathing +, Able to complete a few sentences but slowly, Mild occ wheeze + Heart:  S1 and S2 normal, no murmur, CVP - no.  Pressors - no Abdomen:  Soft, no masses, no organomegaly Genitalia / Rectal:  Not done Extremities:  Extremities- intact Skin:  ntact in exposed areas .  Neurologic:  Sedation - none -> RASS - +1 . Moves all 4s - yes. CAM-ICU - neg . Orientation - x3+     LABS    PULMONARY Recent Labs  Lab 08/30/18 0920 08/31/18 1236 09/02/18 0521 09/02/18 1122 09/03/18 0425  PHART 7.300* 7.401 7.338* 7.437 7.404  PCO2ART 55.5* 40.7 75.7* 61.2* 66.7*  PO2ART 86.0 126.0* 80.0* 71.0* 81.0*  HCO3 27.2 25.2 40.7* 41.1* 41.8*  TCO2  29 26 43* 43* 44*  O2SAT 95.0 99.0 94.0 94.0 95.0    CBC Recent Labs  Lab 09/02/18 0805 09/03/18 0300 09/04/18 0300  HGB 8.9* 9.1* 12.0  HCT 32.5* 33.9* 41.4  WBC 16.6* 15.4* 20.2*  PLT 228 219 223    COAGULATION No results for input(s): INR in the last 168 hours.  CARDIAC  No results for input(s): TROPONINI in the last 168 hours. No results for input(s): PROBNP in the last 168 hours.   CHEMISTRY Recent Labs  Lab 09/01/18 1144 09/01/18 1701 09/02/18 0805 09/03/18 0300 09/04/18 0300 09/04/18 1034  NA 144  --  144 141 <102* 139  K 3.9  --  3.6 4.6 3.1* 3.9  CL 110  --  101 100 <65* 93*  CO2 29  --   35* 35* 15* 33*  GLUCOSE 174*  --  153* 163* 87 92  BUN 20  --  24* 27* 24* 27*  CREATININE 0.65  --  0.73 0.60 0.70 0.69  CALCIUM 6.8*  --  6.7* 6.7* 5.3* 7.4*  MG 2.4 2.3 2.2 1.9 0.9* 2.1  PHOS 2.1* 2.8 2.0* 2.8 2.3* 3.6   Estimated Creatinine Clearance: 60.6 mL/min (by C-G formula based on SCr of 0.69 mg/dL).   LIVER Recent Labs  Lab 08/30/18 0856 08/31/18 0316  AST  --  16  ALT  --  10  ALKPHOS  --  61  BILITOT  --  0.7  PROT  --  5.2*  ALBUMIN 2.6* 2.5*     INFECTIOUS Recent Labs  Lab 08/29/18 0025 08/30/18 0315 08/31/18 0316  PROCALCITON 0.76 0.57 0.31     ENDOCRINE CBG (last 3)  Recent Labs    09/04/18 0507 09/04/18 0739 09/04/18 1153  GLUCAP 115* 95 94         IMAGING x48h  - image(s) personally visualized  -   highlighted in bold Dg Chest Port 1 View  Result Date: 09/03/2018 CLINICAL DATA:  Respiratory failure EXAM: PORTABLE CHEST 1 VIEW COMPARISON:  09/02/2018 FINDINGS: Cardiac shadows within normal limits. Changes consistent with multilevel vertebral augmentation are again noted. 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 No focal infiltrate or sizable effusion is seen. No bony abnormality is noted. Endotracheal tube and nasogastric catheter are again seen and stable. IMPRESSION: Overall stable appearance of the chest.  No acute abnormality noted. 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 Electronically Signed   By: Alcide Clever M.D.   On: 09/03/2018 09:02   Dg Chest Port 1 View  Result Date: 09/02/2018 CLINICAL DATA:  52 year old female with history of intubation. EXAM: PORTABLE CHEST 1 VIEW COMPARISON:  Chest x-ray 09/02/2018. FINDINGS: An endotracheal tube is in place with tip 3.4 cm above the carina. A nasogastric tube is seen extending into the stomach, however, the tip of the nasogastric tube extends below the lower margin of the image. Lung volumes are low. No consolidative airspace disease. No  pleural effusions. No evidence of pulmonary edema. Heart size is normal. Upper mediastinal contours are within normal limits. Post vertebroplasty changes throughout the mid to lower thoracic spine. Numerous branching cement densities throughout the lungs bilaterally, compatible with widespread embolization of cement material, similar to prior studies. IMPRESSION: 1. Support apparatus and postprocedural changes, as above. 2. No radiographic evidence of acute cardiopulmonary disease. 3. 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 Electronically Signed   By: Trudie Reed M.D.   On: 09/02/2018 19:13  Assessment & Plan:   Acute on chronic hypercapnic and hypoxemic respiratory failure  COPD exacerbation, acute.  Cont steroids, CAP abtx coverage (though no discrete consolidation on CXR)  11/28 - remains extubated. But baseline chronic hypercapnic and current resp pattern puts her at risk for reintubation   Plan - recheck aBG to decide on bipap now - definite bipap QHS - Budesonide / Brovana, Duonebs - Continue diureses but at a lower dose - If worsens will reintubate   Acute encephalopathy - was initially likely 2/2 sedation and or acidosis, now appears to be in benzo withdrawal.   Tremor, agitation, tachypnea, tachycardia when off sedation.  Given hx of benzo use and severe anxiety (brother notes tremors occasionally), likely benzo withdrawal, although she has been receiving versed pushes.   No seizure on EEG.   11/28 - holding own. But seems to need benzo prn  Plan - dc precedex gtt - do benzo prn  - might need precedex back  Anemia (Labs from [facility] with Hgb 9.3), stable  P:  Trend CBC Transfuse for Hgb <7  Back pain - s/p lumbar kyphoplasty 11/20 P: F/u as outpatient. Site remains WNL  Hx fibromyalgia, depression, anxiety P: Continue home xanax 0.5 mgTID PRN, restart vibryd Cont remeron for sleep.    Family updated bedside  Etoh history:  - Thiamine - Folate - Precedex  If needed Best Practice:  Diet:  NPO but for meds with apple sauce Pain/Anxiety/Delirium protocol (if indicated): monitor VAP protocol (if indicated): In place. DVT prophylaxis: SCD's / Heparin SQ GI prophylaxis: Pantoprazole Glucose control: CBG q 4 while on steroids Mobility: Bedrest. Code Status: Full. Family Communication: Karlene Einstein [phone]) at bedside and updated 09/03/18. Patient updated 09/04/18   Disposition:  Hold off transfer out for 1 more day to ensure continued stability due to reintubation risk    ATTESTATION & SIGNATURE   The patient DHRUTI GHUMAN is critically ill with multiple organ systems failure and requires high complexity decision making for assessment and support, frequent evaluation and titration of therapies, application of advanced monitoring technologies and extensive interpretation of multiple databases.   Critical Care Time devoted to patient care services described in this note is  30  Minutes. This time reflects time of care of this signee Dr Kalman Shan. This critical care time does not reflect procedure time, or teaching time or supervisory time of PA/NP/Med student/Med Resident etc but could involve care discussion time     Dr. Kalman Shan, M.D., [facility].C.P Pulmonary and Critical Care Medicine Staff Physician  System [place] [facility] Pager: [phone], If no answer or between  15:00h - 7:00h: call [phone]  09/04/2018 4:30 PM

## 2018-09-04 NOTE — Progress Notes (Signed)
Pt placed on BIPAP for the night at 12/5 BUR 10 and 40% FIO2, Tol well.

## 2018-09-04 NOTE — Progress Notes (Signed)
PT Cancellation Note  Patient Details Name: Sarah Chan MRN: [id number] DOB: 26-Apr-1966   Cancelled Treatment:    Reason Eval/Treat Not Completed: Fatigue/lethargy limiting ability to participate(attempted mobility with nsg this am with elevated HR)   Fabio Asaevon J Ritchie Klee 09/04/2018, 7:18 AM

## 2018-09-05 ENCOUNTER — Inpatient Hospital Stay (HOSPITAL_COMMUNITY): Payer: Medicare Other

## 2018-09-05 LAB — BASIC METABOLIC PANEL
Anion gap: 13 (ref 5–15)
BUN: 31 mg/dL — ABNORMAL HIGH (ref 6–20)
CALCIUM: 7.4 mg/dL — AB (ref 8.9–10.3)
CO2: 30 mmol/L (ref 22–32)
CREATININE: 0.72 mg/dL (ref 0.44–1.00)
Chloride: 95 mmol/L — ABNORMAL LOW (ref 98–111)
GFR calc Af Amer: >60 mL/min (ref >60)
Glucose, Bld: 103 mg/dL — ABNORMAL HIGH (ref 70–99)
Potassium: 4.2 mmol/L (ref 3.5–5.1)
Sodium: 138 mmol/L (ref 135–145)

## 2018-09-05 LAB — CBC WITH DIFFERENTIAL/PLATELET
Abs Immature Granulocytes: 0.16 10*3/uL — ABNORMAL HIGH (ref 0.00–0.07)
Basophils Absolute: 0 10*3/uL (ref 0.0–0.1)
Basophils Relative: 0 %
EOS PCT: 0 %
Eosinophils Absolute: 0 10*3/uL (ref 0.0–0.5)
HCT: 38.7 % (ref 36.0–46.0)
Hemoglobin: 11.1 g/dL — ABNORMAL LOW (ref 12.0–15.0)
Immature Granulocytes: 1 %
Lymphocytes Relative: 4 %
Lymphs Abs: 0.7 10*3/uL (ref 0.7–4.0)
MCH: 26.4 pg (ref 26.0–34.0)
MCHC: 28.7 g/dL — ABNORMAL LOW (ref 30.0–36.0)
MCV: 91.9 fL (ref 80.0–100.0)
Monocytes Absolute: 1.1 10*3/uL — ABNORMAL HIGH (ref 0.1–1.0)
Monocytes Relative: 5 %
Neutro Abs: 18 10*3/uL — ABNORMAL HIGH (ref 1.7–7.7)
Neutrophils Relative %: 90 %
Platelets: 350 10*3/uL (ref 150–400)
RBC: 4.21 MIL/uL (ref 3.87–5.11)
RDW: 14.5 % (ref 11.5–15.5)
WBC: 20 10*3/uL — ABNORMAL HIGH (ref 4.0–10.5)
nRBC: 0 % (ref 0.0–0.2)

## 2018-09-05 LAB — GLUCOSE, CAPILLARY
GLUCOSE-CAPILLARY: 150 mg/dL — AB (ref 70–99)
Glucose-Capillary: 128 mg/dL — ABNORMAL HIGH (ref 70–99)
Glucose-Capillary: 79 mg/dL (ref 70–99)
Glucose-Capillary: 126 mg/dL — ABNORMAL HIGH (ref 70–99)
Glucose-Capillary: 88 mg/dL (ref 70–99)

## 2018-09-05 LAB — PHOSPHORUS: Phosphorus: 3.5 mg/dL (ref 2.5–4.6)

## 2018-09-05 LAB — MAGNESIUM: MAGNESIUM: 2.2 mg/dL (ref 1.7–2.4)

## 2018-09-05 LAB — TROPONIN I: Troponin I: 0.03 ng/mL (ref <0.03)

## 2018-09-05 LAB — TRIGLYCERIDES: Triglycerides: 142 mg/dL (ref <150)

## 2018-09-05 MED ORDER — INSULIN ASPART 100 UNIT/ML ~~LOC~~ SOLN
0.0000 [IU] | Freq: Three times a day (TID) | SUBCUTANEOUS | Status: DC
  Administered 2018-09-05 – 2018-09-06 (×2): 1 [IU] via SUBCUTANEOUS

## 2018-09-05 MED ORDER — METHYLPREDNISOLONE SODIUM SUCC 40 MG IJ SOLR
40.0000 mg | Freq: Two times a day (BID) | INTRAMUSCULAR | Status: DC
  Administered 2018-09-05 – 2018-09-07 (×4): 40 mg via INTRAVENOUS
  Filled 2018-09-05 (×4): qty 1

## 2018-09-05 MED ORDER — INFLUENZA VAC SPLIT QUAD 0.5 ML IM SUSY
0.5000 mL | PREFILLED_SYRINGE | INTRAMUSCULAR | Status: AC
  Administered 2018-09-06: 0.5 mL via INTRAMUSCULAR
  Filled 2018-09-05: qty 0.5

## 2018-09-05 NOTE — Progress Notes (Addendum)
   NAME:  Sarah RandRhonda H Sykora, MRN:  [id number], DOB:  Apr 10, 1966, LOS: 8 ADMISSION DATE:  08/28/2018, CONSULTATION DATE:  08/29/18 REFERRING MD:  [facility] [place]  CHIEF COMPLAINT:  Respiratory Failure   Brief History   Sarah Chan is a 52 y.o. female who [facility] acute on chronic hypercapnic and hypoxemic respiratory failure requiring mechanical ventilation.  Had just had lumbar kyphoplasty 1 day prior.  Past Medical History  COPD on home O2, GERD, HTN, fibromyalgia, anxiety, depression.  Significant Hospital Events   [facility]21 > [facility] Reported fever of 101.9 11/24 Episodes of coughing, tachypnea, tremors, Resolve with versed.  Resp status improving.  11/27 - Diuresed well overnight. Less anxious this AM, using BIPAP intermittentyl  11/29 ambulating. Feeling better, awaiting MBS Consults:  PCCM  Procedures:  ETT 11/21>>> 11/27  Significant Diagnostic Tests:  CXR 11/21 > emphysema. CT head 11/24 - nil acute  Micro Data:  Blood 11/21 >  Sputum 11/21 >  Normal flora Urine 11/21 > <10K insig growth MRSA PCR >> neg Fu PCR 11/22 - neg HIV 11/22 Antimicrobials:  11/22 unasyn  (completed 5days)  Interim history/subjective:  Ambulating feeling better   Objective:  Blood pressure (Abnormal) 134/94, pulse (Abnormal) 112, temperature 98.6 F (37 C), temperature source Oral, resp. rate (Abnormal) 27, height 5\' 2"  (1.575 m), weight 44.4 kg, SpO2 99 %.    FiO2 (%):  [40 %] 40 %   Intake/Output Summary (Last 24 hours) at 09/05/2018 1151 Last data filed at 09/05/2018 1100 Gross per 24 hour  Intake 120 ml  Output 635 ml  Net -515 ml   Filed Weights   09/03/18 0500 09/04/18 0500 09/05/18 0500  Weight: 51.6 kg 46.7 kg 44.4 kg   General this is a 52 year old female. No acute distress. Still has sig WOB HENT: NCAT no JVD pulm diminished t/o some faint wheeze and accessory muscle use w/ PT Card RRR abd not  tender + bowel sounds No OM  GU clear yellow  Neuro intact  Ext warm and dry   Resolved issues   Acute encephalopathy felt metabolic and then possibly c/b benzo w/d  Assessment & Plan:   Acute on chronic hypercapnic and hypoxemic respiratory failure  COPD exacerbation, acute.  -clinically improving slowly -s/p 5 days unasyn Plan Wean oxygen Nocturnal BIPAP (this is baseline) Scheduled BDs Decrease pred to 40 bid Flutter valve Swallow eval (aspiration risk) Mobilize   Anemia  Plan Trend cbc  On-going leulocytosis Plan Trend cbc  Back pain - s/p lumbar kyphoplasty 11/20 Plan PRN analgesia   Hx fibromyalgia, depression, anxiety Plan Cont home benzos  Cont Vibryd  H/o ETOH Plan Cont thiamine and folate    Best Practice:  Diet:  NPO but for meds with apple sauce; awaiting swallow eval  Pain/Anxiety/Delirium protocol (if indicated): monitor VAP protocol (if indicated): In place. DVT prophylaxis: SCD's / Heparin SQ GI prophylaxis: Pantoprazole Glucose control: CBG q 4 while on steroids Mobility:OOB w/ PT . Code Status: Full. Family Communication: Karlene EinsteinBrother, Randy Huff [phone]) at bedside and updated 09/03/18. Patient updated 09/04/18   Disposition:  To medsurg have triad assume care   Simonne MartinetPeter E Margaretmary Prisk ACNP-BC [facility] Pulmonary/Critical [facility] Pager # [phone] OR # [phone] if no answer

## 2018-09-05 NOTE — Evaluation (Signed)
Physical Therapy Evaluation Patient Details Name: Sarah Chan MRN: [id number] DOB: 07/31/1966 Today's Date: 09/05/2018   History of Present Illness  Pt adm with acute on chronic respiratory failure requiring intubation 11/21 - 11/27. PMH - O2 dependent COPD, lumbar kyphoplasty 08/27/18, scleroderma, fibromyalgia, depression/anxiety,   Clinical Impression  Pt presents to PT with decr mobility due to weakness, severely limited functional activity tolerance and decr balance. Pt with very little reserve and minimal activity fatigues pt quickly. Expect pt will make steady progress but will need initial 24 hour assist at home. Best friend present in room and states pt will have someone with her 24 hours initially on dc. Will recommend home with HHPT and 24 hour assist. If pt's assist at home doesn't materialize, pt likely will need to look at ST-SNF.     Follow Up Recommendations Home health PT;Supervision/Assistance - 24 hour(if family/friends can't provide 24 hour assist will need SNF)    Equipment Recommendations  Wheelchair (measurements PT)    Recommendations for Other Services       Precautions / Restrictions Precautions Precautions: Fall Restrictions Weight Bearing Restrictions: No      Mobility  Bed Mobility               General bed mobility comments: Pt up in chair  Transfers Overall transfer level: Needs assistance Equipment used: 4-wheeled walker Transfers: Sit to/from BJ'sStand;Stand Pivot Transfers Sit to Stand: Mod assist Stand pivot transfers: Mod assist       General transfer comment: Assist to bring hips up and for balance. Verbal cues for hand placement  Ambulation/Gait Ambulation/Gait assistance: Min assist;+2 safety/equipment Gait Distance (Feet): 20 Feet Assistive device: 4-wheeled walker Gait Pattern/deviations: Step-through pattern;Decreased step length - right;Decreased step length - left;Shuffle;Trunk flexed Gait velocity: decr Gait  velocity interpretation: <1.31 ft/sec, indicative of household ambulator General Gait Details: Assist for balance and support. Pt amb on 3L of O2. Unable to obtain SpO2 reading. Fatigued quickly and took seated rest break on rollator. Too fatigued to amb back to room. Taken back via Mining engineerrollator  Stairs            Wheelchair Mobility    Modified Rankin (Stroke Patients Only)       Balance Overall balance assessment: Needs assistance Sitting-balance support: No upper extremity supported;Feet supported Sitting balance-Leahy Scale: Good     Standing balance support: Bilateral upper extremity supported Standing balance-Leahy Scale: Poor Standing balance comment: rollator and min guard for static standing                              Pertinent Vitals/Pain Pain Assessment: Faces Faces Pain Scale: Hurts little more Pain Location: back Pain Descriptors / Indicators: Grimacing;Guarding Pain Intervention(s): Monitored during session;Repositioned    Home Living Family/patient expects to be discharged to:: Private residence Living Arrangements: Children Available Help at Discharge: Family;Friend(s);Available 24 hours/day(at least initially per her friend) Type of Home: House Home Access: Stairs to enter Entrance Stairs-Rails: Right Entrance Stairs-Number of Steps: 2 Home Layout: One level Home Equipment: Walker - 4 wheels      Prior Function Level of Independence: Needs assistance   Gait / Transfers Assistance Needed: modified independent without assistive device. Limited primarily to household distances            Hand Dominance        Extremity/Trunk Assessment   Upper Extremity Assessment Upper Extremity Assessment: Defer to OT evaluation    Lower  Extremity Assessment Lower Extremity Assessment: Generalized weakness       Communication   Communication: No difficulties  Cognition Arousal/Alertness: Awake/alert Behavior During Therapy: WFL for  tasks assessed/performed Overall Cognitive Status: Impaired/Different from baseline Area of Impairment: Following commands;Problem solving                       Following Commands: Follows multi-step commands with increased time     Problem Solving: Slow processing        General Comments      Exercises     Assessment/Plan    PT Assessment Patient needs continued PT services  PT Problem List Decreased strength;Decreased activity tolerance;Decreased balance;Decreased mobility;Decreased knowledge of use of DME;Cardiopulmonary status limiting activity       PT Treatment Interventions DME instruction;Gait training;Stair training;Functional mobility training;Therapeutic activities;Therapeutic exercise;Balance training;Patient/family education    PT Goals (Current goals can be found in the Care Plan section)  Acute Rehab PT Goals Patient Stated Goal: get something to eat PT Goal Formulation: With patient Time For Goal Achievement: 09/19/18 Potential to Achieve Goals: Good    Frequency Min 3X/week   Barriers to discharge Inaccessible home environment;Decreased caregiver support stairs to enter. Need to reiterate 24 hour assist    Co-evaluation               AM-PAC PT "6 Clicks" Mobility  Outcome Measure Help needed turning from your back to your side while in a flat bed without using bedrails?: A Little Help needed moving from lying on your back to sitting on the side of a flat bed without using bedrails?: A Little Help needed moving to and from a bed to a chair (including a wheelchair)?: A Lot Help needed standing up from a chair using your arms (e.g., wheelchair or bedside chair)?: A Lot Help needed to walk in hospital room?: A Little Help needed climbing 3-5 steps with a railing? : Total 6 Click Score: 14    End of Session Equipment Utilized During Treatment: Gait belt;Oxygen Activity Tolerance: Patient limited by fatigue Patient left: in chair;with  call bell/phone within reach;with family/visitor present Nurse Communication: Mobility status PT Visit Diagnosis: Unsteadiness on feet (R26.81);Other abnormalities of gait and mobility (R26.89);Muscle weakness (generalized) (M62.81)    Time: 1610-9604 PT Time Calculation (min) (ACUTE ONLY): 16 min   Charges:   PT Evaluation $PT Eval Moderate Complexity: 1 Mod          [facility] PT Acute Rehabilitation Services Pager [phone] Office [phone]   Angelina Ok [facility] 09/05/2018, 3:01 PM

## 2018-09-05 NOTE — Plan of Care (Signed)

## 2018-09-05 NOTE — Progress Notes (Signed)
  Speech Language Pathology Treatment: Dysphagia  Patient Details Name: Sarah Chan MRN: [id number] DOB: 02-22-1966 Today's Date: 09/05/2018 Time: [id number] SLP Time Calculation (min) (ACUTE ONLY): 13 min  Assessment / Plan / Recommendation Clinical Impression  Pt up in chair and consumed trials thin and puree with stable RR compared to yesterday however slight leading to challenging coordination of swallow breathe. Consistent throat clearing suspicious for airway intrusion following 7 day intubation and deconditioning and appropriate for instrumental testing. MBS is scheduled today at 1300.    HPI HPI: Sarah Chan is a 52 y.o. female [facility] on chronic hypercapnic and hypoxemic respiratory failure requiring mechanical ventilation.  Had just had lumbar kyphoplasty 1 day prior. Intubated from 11/21-11/27      SLP Plan  MBS       Recommendations  Diet recommendations: (meds in applesauce)                Oral Care Recommendations: Oral care QID Follow up Recommendations: Skilled Nursing facility SLP Visit Diagnosis: Dysphagia, unspecified (R13.10) Plan: MBS                       Royce MacadamiaLitaker, Abdulhadi Stopa Willis 09/05/2018, 11:35 AM  Breck CoonsLisa Willis Lonell FaceLitaker M.Ed Nurse, children'sCCC-SLP Speech-Language Pathologist Pager [phone] Office [phone]

## 2018-09-06 DIAGNOSIS — F419 Anxiety disorder, unspecified: Secondary | ICD-10-CM

## 2018-09-06 LAB — CBC WITH DIFFERENTIAL/PLATELET
Abs Immature Granulocytes: 0.09 10*3/uL — ABNORMAL HIGH (ref 0.00–0.07)
BASOS ABS: 0 10*3/uL (ref 0.0–0.1)
Basophils Relative: 0 %
Eosinophils Absolute: 0 10*3/uL (ref 0.0–0.5)
Eosinophils Relative: 0 %
HCT: 36.4 % (ref 36.0–46.0)
Hemoglobin: 10.1 g/dL — ABNORMAL LOW (ref 12.0–15.0)
IMMATURE GRANULOCYTES: 1 %
Lymphocytes Relative: 5 %
Lymphs Abs: 0.8 10*3/uL (ref 0.7–4.0)
MCH: 26.1 pg (ref 26.0–34.0)
MCHC: 27.7 g/dL — ABNORMAL LOW (ref 30.0–36.0)
MCV: 94.1 fL (ref 80.0–100.0)
Monocytes Absolute: 1 10*3/uL (ref 0.1–1.0)
Monocytes Relative: 7 %
NEUTROS ABS: 13.3 10*3/uL — AB (ref 1.7–7.7)
NEUTROS PCT: 87 %
Platelets: 316 10*3/uL (ref 150–400)
RBC: 3.87 MIL/uL (ref 3.87–5.11)
RDW: 14.3 % (ref 11.5–15.5)
WBC: 15.2 10*3/uL — ABNORMAL HIGH (ref 4.0–10.5)
nRBC: 0 % (ref 0.0–0.2)

## 2018-09-06 LAB — GLUCOSE, CAPILLARY
GLUCOSE-CAPILLARY: 75 mg/dL (ref 70–99)
Glucose-Capillary: 103 mg/dL — ABNORMAL HIGH (ref 70–99)
Glucose-Capillary: 111 mg/dL — ABNORMAL HIGH (ref 70–99)
Glucose-Capillary: 106 mg/dL — ABNORMAL HIGH (ref 70–99)
Glucose-Capillary: 123 mg/dL — ABNORMAL HIGH (ref 70–99)

## 2018-09-06 LAB — COMPREHENSIVE METABOLIC PANEL
ALT: 18 U/L (ref 0–44)
ANION GAP: 11 (ref 5–15)
AST: 20 U/L (ref 15–41)
Albumin: 3.5 g/dL (ref 3.5–5.0)
Alkaline Phosphatase: 60 U/L (ref 38–126)
BUN: 20 mg/dL (ref 6–20)
CO2: 32 mmol/L (ref 22–32)
Calcium: 7.4 mg/dL — ABNORMAL LOW (ref 8.9–10.3)
Chloride: 96 mmol/L — ABNORMAL LOW (ref 98–111)
Creatinine, Ser: 0.71 mg/dL (ref 0.44–1.00)
Glucose, Bld: 109 mg/dL — ABNORMAL HIGH (ref 70–99)
Potassium: 3.4 mmol/L — ABNORMAL LOW (ref 3.5–5.1)
Sodium: 139 mmol/L (ref 135–145)
Total Bilirubin: 0.7 mg/dL (ref 0.3–1.2)
Total Protein: 6.3 g/dL — ABNORMAL LOW (ref 6.5–8.1)

## 2018-09-06 LAB — PHOSPHORUS: Phosphorus: 3.2 mg/dL (ref 2.5–4.6)

## 2018-09-06 LAB — MAGNESIUM: MAGNESIUM: 2.3 mg/dL (ref 1.7–2.4)

## 2018-09-06 MED ORDER — VILAZODONE HCL 20 MG PO TABS
40.0000 mg | ORAL_TABLET | Freq: Every day | ORAL | Status: DC
  Administered 2018-09-06 – 2018-09-07 (×2): 40 mg via ORAL
  Filled 2018-09-06 (×2): qty 2

## 2018-09-06 MED ORDER — POTASSIUM CHLORIDE CRYS ER 20 MEQ PO TBCR
40.0000 meq | EXTENDED_RELEASE_TABLET | Freq: Once | ORAL | Status: AC
  Administered 2018-09-06: 40 meq via ORAL
  Filled 2018-09-06: qty 2

## 2018-09-06 MED ORDER — KETOROLAC TROMETHAMINE 30 MG/ML IJ SOLN
15.0000 mg | Freq: Once | INTRAMUSCULAR | Status: AC
  Administered 2018-09-06: 15 mg via INTRAVENOUS
  Filled 2018-09-06: qty 1

## 2018-09-06 NOTE — Progress Notes (Signed)
PROGRESS NOTE  Sarah Chan WUJ:[id number] DOB: September 01, 1966 DOA: 08/28/2018 PCP: Kirstie Peri, MD   LOS: 9 days   Brief Narrative / Interim history: 52 year old female transferred from [facility] ER with acute on chronic hypercarbic and hypoxemic respiratory failure requiring mechanical ventilation.  Had chest had lumbar kyphoplasty a day prior.  She has a history of COPD on 3 L home oxygen, hypertension, fibromyalgia, anxiety and depression.  Events 11/21 > presented to [facility], intubated and transferred to [facility]. Reported fever of 101.9 11/24 Episodes of coughing, tachypnea, tremors, Resolve with versed.  Resp status improving.  11/27 - Diuresed well overnight. Less anxious this AM, using BIPAP intermittentyl  11/29 ambulating. Feeling better, awaiting MBS  Subjective: -She is doing a little bit better this morning, feels quite fatigued and was barely able to ambulate a few steps yesterday due to weakness.  No chest pain, no significant shortness of breath at rest.  No abdominal pain, nausea and vomiting.  Complains of a poor appetite  Assessment & Plan: Active Problems:   Acute on chronic respiratory failure with hypercapnia (HCC)   History of benzodiazepine use   S/P kyphoplasty   Endotracheal tube present   Encephalopathy acute   Acute respiratory failure with hypercapnia (HCC)   Tremor   Fever   Pressure injury of skin   Principal Problem Acute on chronic hypercapnic and hypoxemic respiratory failure due to COPD exacerbation -Continue supportive treatment with oxygen, nebulizers, Solu-Medrol, she received 5 days of Unasyn and currently she is being monitored off antibiotics -Continue nocturnal BiPAP which is baseline -SLP evaluation, MBS pending  Additional Problems Back pain -Status post lumbar kyphoplasty on 11/20, PRN analgesia  Intermittent post ICU delirium -Brother who is at bedside tells me that she gets confused occasionally, this is likely post ICU delirium, will  get better over time  History of fibromyalgia, depression, anxiety -Continue supportive treatment, Remeron, Vilazodone  Leukocytosis -Probably related to steroids, white count is improving  Anemia of chronic disease -Hemoglobin stable, no bleeding  Scheduled Meds: . arformoterol  15 mcg Nebulization BID  . budesonide (PULMICORT) nebulizer solution  0.5 mg Nebulization BID  . heparin  5,000 Units Subcutaneous Q8H  . insulin aspart  0-9 Units Subcutaneous TID AC & HS  . ipratropium-albuterol  3 mL Nebulization Q4H  . methylPREDNISolone (SOLU-MEDROL) injection  40 mg Intravenous Q12H  . metoprolol succinate  25 mg Oral Daily  . mirtazapine  15 mg Oral QHS  . pantoprazole  40 mg Oral Daily  . thiamine  100 mg Intravenous Daily  . Vilazodone HCl  40 mg Oral Daily   Continuous Infusions: . sodium chloride 10 mL/hr at 09/05/18 2043  . sodium chloride    . feeding supplement (VITAL AF 1.2 CAL) 45 mL/hr at 09/03/18 0258   PRN Meds:.Place/Maintain arterial line **AND** sodium chloride, acetaminophen, albuterol, ALPRAZolam, bisacodyl, sennosides  DVT prophylaxis: SCDs Code Status: Full code Family Communication: brother at bedside Disposition Plan: home when ready   Consultants:   PCCM  Procedures:   None   Antimicrobials:  Finished 5 days of Unasyn  Objective: Vitals:   09/06/18 0400 09/06/18 0646 09/06/18 0748 09/06/18 0854  BP: 116/79  134/81   Pulse: 88     Resp: (!) 22     Temp: 97.8 F (36.6 C)  98.2 F (36.8 C)   TempSrc: Axillary  Oral   SpO2: 97%  98% 100%  Weight:  46.8 kg    Height:  Intake/Output Summary (Last 24 hours) at 09/06/2018 1137 Last data filed at 09/05/2018 1800 Gross per 24 hour  Intake 240 ml  Output -  Net 240 ml   Filed Weights   09/05/18 0500 09/05/18 1948 09/06/18 0646  Weight: 44.4 kg 45.1 kg 46.8 kg    Examination:  Constitutional: Appears quite weak, shallow breathing and she is tachypneic Eyes: PERRL, lids and  conjunctivae normal ENMT: Mucous membranes are moist. No oropharyngeal exudates Neck: normal, supple Respiratory: clear to auscultation bilaterally, no wheezing, no crackles.  Increased respiratory effort, overall distant breath sounds Cardiovascular: Regular rate and rhythm, no murmurs / rubs / gallops. No LE edema. 2+ pedal pulses.  Abdomen: no tenderness. Bowel sounds positive.  Musculoskeletal: no clubbing / cyanosis.  Skin: no rashes Neurologic: CN 2-12 grossly intact. Strength 5/5 in all 4.  Psychiatric: Normal judgment and insight. Alert and oriented x 3. Normal mood.    Data Reviewed: I have independently reviewed following labs and imaging studies   CBC: Recent Labs  Lab 09/02/18 0805 09/03/18 0300 09/04/18 0300 09/05/18 0048 09/06/18 0553  WBC 16.6* 15.4* 20.2* 20.0* 15.2*  NEUTROABS  --   --   --  18.0* 13.3*  HGB 8.9* 9.1* 12.0 11.1* 10.1*  HCT 32.5* 33.9* 41.4 38.7 36.4  MCV 93.7 94.4 89.4 91.9 94.1  PLT 228 219 223 350 316   Basic Metabolic Panel: Recent Labs  Lab 09/03/18 0300 09/04/18 0300 09/04/18 1034 09/05/18 0048 09/06/18 0553  NA 141 <102* 139 138 139  K 4.6 3.1* 3.9 4.2 3.4*  CL 100 <65* 93* 95* 96*  CO2 35* 15* 33* 30 32  GLUCOSE 163* 87 92 103* 109*  BUN 27* 24* 27* 31* 20  CREATININE 0.60 0.70 0.69 0.72 0.71  CALCIUM 6.7* 5.3* 7.4* 7.4* 7.4*  MG 1.9 0.9* 2.1 2.2 2.3  PHOS 2.8 2.3* 3.6 3.5 3.2   GFR: Estimated Creatinine Clearance: 60.8 mL/min (by C-G formula based on SCr of 0.71 mg/dL). Liver Function Tests: Recent Labs  Lab 08/31/18 0316 09/06/18 0553  AST 16 20  ALT 10 18  ALKPHOS 61 60  BILITOT 0.7 0.7  PROT 5.2* 6.3*  ALBUMIN 2.5* 3.5   No results for input(s): LIPASE, AMYLASE in the last 168 hours. No results for input(s): AMMONIA in the last 168 hours. Coagulation Profile: No results for input(s): INR, PROTIME in the last 168 hours. Cardiac Enzymes: Recent Labs  Lab 09/05/18 0048  TROPONINI 0.03*   BNP (last 3  results) No results for input(s): PROBNP in the last 8760 hours. HbA1C: No results for input(s): HGBA1C in the last 72 hours. CBG: Recent Labs  Lab 09/05/18 0730 09/05/18 1142 09/05/18 1626 09/05/18 2243 09/06/18 0751  GLUCAP 79 126* 150* 88 103*   Lipid Profile: Recent Labs    09/05/18 0946  TRIG 142   Thyroid Function Tests: No results for input(s): TSH, T4TOTAL, FREET4, T3FREE, THYROIDAB in the last 72 hours. Anemia Panel: No results for input(s): VITAMINB12, FOLATE, FERRITIN, TIBC, IRON, RETICCTPCT in the last 72 hours. Urine analysis:    Component Value Date/Time   COLORURINE YELLOW 08/31/2018 2040   APPEARANCEUR CLEAR 08/31/2018 2040   LABSPEC 1.026 08/31/2018 2040   PHURINE 5.0 08/31/2018 2040   GLUCOSEU NEGATIVE 08/31/2018 2040   HGBUR SMALL (A) 08/31/2018 2040   BILIRUBINUR NEGATIVE 08/31/2018 2040   KETONESUR 20 (A) 08/31/2018 2040   PROTEINUR 30 (A) 08/31/2018 2040   NITRITE NEGATIVE 08/31/2018 2040   LEUKOCYTESUR NEGATIVE 08/31/2018 2040  Sepsis Labs: Invalid input(s): PROCALCITONIN, LACTICIDVEN  Recent Results (from the past 240 hour(s))  MRSA PCR Screening     Status: None   Collection Time: 08/28/18 11:25 PM  Result Value Ref Range Status   MRSA by PCR NEGATIVE NEGATIVE Final    Comment:        The GeneXpert MRSA Assay (FDA approved for NASAL specimens only), is one component of a comprehensive MRSA colonization surveillance program. It is not intended to diagnose MRSA infection nor to guide or monitor treatment for MRSA infections. Performed at [facility], [address] [place], [place] [address]   Culture, blood (routine x 2)     Status: None   Collection Time: 08/29/18 12:25 AM  Result Value Ref Range Status   Specimen Description BLOOD LEFT HAND  Final   Special Requests   Final    BOTTLES DRAWN AEROBIC ONLY Blood Culture adequate volume   Culture   Final    NO GROWTH 5 DAYS Performed at [facility], [address], [place], [place] [address]    Report Status 09/03/2018 FINAL  Final  Culture, blood (routine x 2)     Status: None   Collection Time: 08/29/18 12:38 AM  Result Value Ref Range Status   Specimen Description BLOOD RIGHT ARM  Final   Special Requests   Final    BOTTLES DRAWN AEROBIC ONLY Blood Culture results may not be optimal due to an inadequate volume of blood received in culture bottles   Culture   Final    NO GROWTH 5 DAYS Performed at [facility], [address] [place], [place] [address]    Report Status 09/03/2018 FINAL  Final  Urine Culture     Status: Abnormal   Collection Time: 08/29/18 12:38 AM  Result Value Ref Range Status   Specimen Description URINE, RANDOM  Final   Special Requests NONE  Final   Culture (A)  Final    <10,000 COLONIES/mL INSIGNIFICANT GROWTH Performed at [facility], [address], [place], [place] [address]    Report Status 08/30/2018 FINAL  Final  Culture, respiratory (tracheal aspirate)     Status: None   Collection Time: 08/29/18  2:18 AM  Result Value Ref Range Status   Specimen Description TRACHEAL ASPIRATE  Final   Special Requests NONE  Final   Gram Stain   Final    ABUNDANT WBC PRESENT,BOTH PMN AND MONONUCLEAR RARE GRAM POSITIVE COCCI    Culture   Final    FEW Consistent with normal respiratory flora. Performed at [facility], [address] [place], [place] [address]    Report Status 08/31/2018 FINAL  Final      Radiology Studies: Dg Swallowing Func-speech Pathology  Result Date: 09/05/2018 Objective Swallowing Evaluation: Type of Study: MBS-Modified Barium Swallow Study  Patient Details Name: Sarah Chan MRN: [id number] Date of Birth: 1966/09/24 Today's Date: 09/05/2018 Time: SLP Start Time (ACUTE ONLY): 1257 -SLP Stop Time (ACUTE ONLY): 1307 SLP Time Calculation (min) (ACUTE ONLY): 10 min Past Medical History: Past Medical History: Diagnosis Date . Anxiety  . Back pain  . COPD (chronic obstructive  pulmonary disease) (HCC)   Oxygen dependent . Depression  . Fibromyalgia  . GERD (gastroesophageal reflux disease)  . HTN (hypertension), malignant 10/27/2014 . IBS (irritable bowel syndrome)  . Neck pain  . Pneumonia  . Takotsubo cardiomyopathy   a. NSTEMI 04/2013 secondary to Takotsubo's CM - normal cors,  EF 30%. Past Surgical History: Past Surgical History: Procedure Laterality Date . ABDOMINAL HYSTERECTOMY   . C-Secition   . CARDIAC CATHETERIZATION   . CHOLECYSTECTOMY   . LEFT HEART CATHETERIZATION WITH CORONARY ANGIOGRAM N/A 05/04/2013  Procedure: LEFT HEART CATHETERIZATION WITH CORONARY ANGIOGRAM;  Surgeon: Wendall Stade, MD;  Location: [facility] CATH LAB;  Service: Cardiovascular;  Laterality: N/A; HPI: Sarah Chan is a 52 y.o. female who was transferred from [facility] with acute on chronic hypercapnic and hypoxemic respiratory failure requiring mechanical ventilation.  Had just had lumbar kyphoplasty 1 day prior. Intubated from 11/21-11/27. MBS recommended.  No data recorded Assessment / Plan / Recommendation CHL IP CLINICAL IMPRESSIONS 09/05/2018 Clinical Impression Pt's pharyngeal swallow function is mildly impaired marked by silent aspiration x1 with thin using straw and mild residue. Mildy decreased coordination and sensation as thin via straw reached pyriform sinuses and aspirated during the swallow. Mod-max volume of cracker was retained in vallecular due to incomplete epiglottic inversion with partial clearance via multiple swallows and transient penetration with th in via cup that completely cleared. Visible mild dyspnea throughout with recommendation of D3, nectar, rest breaks NO straws and rest breaks when needed. ST to follow.      SLP Visit Diagnosis Dysphagia, pharyngeal phase (R13.13) Attention and concentration deficit following -- Frontal lobe and executive function deficit following -- Impact on safety and function Mild aspiration risk   CHL IP TREATMENT RECOMMENDATION 09/05/2018 Treatment  Recommendations Therapy as outlined in treatment plan below   Prognosis 09/05/2018 Prognosis for Safe Diet Advancement Good Barriers to Reach Goals -- Barriers/Prognosis Comment -- CHL IP DIET RECOMMENDATION 09/05/2018 SLP Diet Recommendations Dysphagia 3 (Mech soft) solids;Thin liquid Liquid Administration via Cup;No straw Medication Administration Whole meds with puree Compensations Slow rate;Small sips/bites Postural Changes Remain semi-upright after after feeds/meals (Comment)   CHL IP OTHER RECOMMENDATIONS 09/05/2018 Recommended Consults -- Oral Care Recommendations Oral care BID Other Recommendations --   CHL IP FOLLOW UP RECOMMENDATIONS 09/05/2018 Follow up Recommendations Skilled Nursing facility   [facility] IP FREQUENCY AND DURATION 09/05/2018 Speech Therapy Frequency (ACUTE ONLY) min 2x/week Treatment Duration 2 weeks      CHL IP ORAL PHASE 09/05/2018 Oral Phase WFL Oral - Pudding Teaspoon -- Oral - Pudding Cup -- Oral - Honey Teaspoon -- Oral - Honey Cup -- Oral - Nectar Teaspoon -- Oral - Nectar Cup WFL Oral - Nectar Straw -- Oral - Thin Teaspoon -- Oral - Thin Cup WFL Oral - Thin Straw WFL Oral - Puree -- Oral - Mech Soft -- Oral - Regular WFL Oral - Multi-Consistency -- Oral - Pill -- Oral Phase - Comment --  CHL IP PHARYNGEAL PHASE 09/05/2018 Pharyngeal Phase Impaired Pharyngeal- Pudding Teaspoon -- Pharyngeal -- Pharyngeal- Pudding Cup -- Pharyngeal -- Pharyngeal- Honey Teaspoon -- Pharyngeal -- Pharyngeal- Honey Cup -- Pharyngeal -- Pharyngeal- Nectar Teaspoon -- Pharyngeal -- Pharyngeal- Nectar Cup WFL Pharyngeal -- Pharyngeal- Nectar Straw -- Pharyngeal -- Pharyngeal- Thin Teaspoon -- Pharyngeal -- Pharyngeal- Thin Cup Penetration/Aspiration during swallow;Pharyngeal residue - valleculae Pharyngeal Material enters airway, remains ABOVE vocal cords then ejected out Pharyngeal- Thin Straw Penetration/Aspiration during swallow;Pharyngeal residue - pyriform Pharyngeal Material enters airway, passes BELOW  cords without attempt by patient to eject out (silent aspiration) Pharyngeal- Puree -- Pharyngeal -- Pharyngeal- Mechanical Soft -- Pharyngeal -- Pharyngeal- Regular Pharyngeal residue - valleculae;Reduced epiglottic inversion Pharyngeal -- Pharyngeal- Multi-consistency -- Pharyngeal -- Pharyngeal- Pill -- Pharyngeal -- Pharyngeal Comment --  CHL IP CERVICAL ESOPHAGEAL PHASE 09/05/2018 Cervical Esophageal Phase  WFL Pudding Teaspoon -- Pudding Cup -- Honey Teaspoon -- Honey Cup -- Nectar Teaspoon -- Nectar Cup -- Nectar Straw -- Thin Teaspoon -- Thin Cup -- Thin Straw -- Puree -- Mechanical Soft -- Regular -- Multi-consistency -- Pill -- Cervical Esophageal Comment -- Royce Macadamia 09/05/2018, 1:37 PM Breck Coons [employer] Pager [phone] Office [phone]               Pamella Pert, MD, PhD [facility] Pager [phone]  If 7PM-7AM, please contact night-coverage [email or web address] Password [facility] 09/06/2018, 11:37 AM

## 2018-09-06 NOTE — Progress Notes (Signed)
Physical Therapy Treatment Patient Details Name: Sarah Chan MRN: [id number] DOB: Jul 13, 1966 Today's Date: 09/06/2018    History of Present Illness Pt adm with acute on chronic respiratory failure requiring intubation 11/21 - 11/27. PMH - O2 dependent COPD, lumbar kyphoplasty 08/27/18, scleroderma, fibromyalgia, depression/anxiety,     PT Comments    Pt unable to progress ambulation distance today, fatigues quickly with mobility. SpO2 90s on 3L, encouraged use of flutter and IS, discussed energy conservation. If Patient does not have good support at home, may need to consider short term SNF.     Follow Up Recommendations  Home health PT;Supervision/Assistance - 24 hour     Equipment Recommendations  Wheelchair (measurements PT)    Recommendations for Other Services       Precautions / Restrictions Precautions Precautions: Fall Restrictions Weight Bearing Restrictions: No    Mobility  Bed Mobility               General bed mobility comments: Pt up in chair  Transfers Overall transfer level: Needs assistance Equipment used: 4-wheeled walker Transfers: Sit to/from Stand;Stand Pivot Transfers Sit to Stand: Supervision Stand pivot transfers: Supervision       General transfer comment: close supevision for safety  Ambulation/Gait Ambulation/Gait assistance: Min guard Gait Distance (Feet): 10 Feet Assistive device: Rolling walker (2 wheeled) Gait Pattern/deviations: Step-through pattern;Decreased step length - right;Decreased step length - left;Shuffle;Trunk flexed Gait velocity: decreased   General Gait Details: pt with very quick fatigue, needing to use BSC. unable to progress ambulation distance this session due to fatigue    Stairs             Wheelchair Mobility    Modified Rankin (Stroke Patients Only)       Balance Overall balance assessment: Needs assistance Sitting-balance support: No upper extremity supported;Feet  supported Sitting balance-Leahy Scale: Good     Standing balance support: Bilateral upper extremity supported Standing balance-Leahy Scale: Poor Standing balance comment: rollator and min guard for static standing                             Cognition Arousal/Alertness: Awake/alert Behavior During Therapy: WFL for tasks assessed/performed Overall Cognitive Status: Impaired/Different from baseline Area of Impairment: Following commands;Problem solving                       Following Commands: Follows multi-step commands with increased time     Problem Solving: Slow processing        Exercises      General Comments        Pertinent Vitals/Pain Pain Assessment: Faces Faces Pain Scale: Hurts a little bit Pain Location: back Pain Descriptors / Indicators: Grimacing;Guarding Pain Intervention(s): Limited activity within patient's tolerance    Home Living                      Prior Function            PT Goals (current goals can now be found in the care plan section) Acute Rehab PT Goals Patient Stated Goal: get something to eat PT Goal Formulation: With patient Time For Goal Achievement: 09/19/18 Potential to Achieve Goals: Good    Frequency    Min 3X/week      PT Plan Current plan remains appropriate    Co-evaluation              AM-PAC PT "6 Clicks" Mobility  Outcome Measure  Help needed turning from your back to your side while in a flat bed without using bedrails?: A Little Help needed moving from lying on your back to sitting on the side of a flat bed without using bedrails?: A Little Help needed moving to and from a bed to a chair (including a wheelchair)?: A Lot Help needed standing up from a chair using your arms (e.g., wheelchair or bedside chair)?: A Lot Help needed to walk in hospital room?: A Little Help needed climbing 3-5 steps with a railing? : Total 6 Click Score: 14    End of Session Equipment  Utilized During Treatment: Gait belt;Oxygen Activity Tolerance: Patient limited by fatigue Patient left: in chair;with call bell/phone within reach;with family/visitor present Nurse Communication: Mobility status PT Visit Diagnosis: Unsteadiness on feet (R26.81);Other abnormalities of gait and mobility (R26.89);Muscle weakness (generalized) (M62.81)     Time: 1324-4010 PT Time Calculation (min) (ACUTE ONLY): 17 min  Charges:  $Therapeutic Activity: 8-22 mins                     Etta Grandchild, PT, DPT Acute Rehabilitation Services Pager: [phone] Office: [phone]     Etta Grandchild 09/06/2018, 1:35 PM

## 2018-09-07 LAB — CBC WITH DIFFERENTIAL/PLATELET
Abs Immature Granulocytes: 0.1 10*3/uL — ABNORMAL HIGH (ref 0.00–0.07)
Basophils Absolute: 0 10*3/uL (ref 0.0–0.1)
Basophils Relative: 0 %
EOS ABS: 0 10*3/uL (ref 0.0–0.5)
Eosinophils Relative: 0 %
HCT: 34.1 % — ABNORMAL LOW (ref 36.0–46.0)
Hemoglobin: 9.3 g/dL — ABNORMAL LOW (ref 12.0–15.0)
Immature Granulocytes: 1 %
Lymphocytes Relative: 10 %
Lymphs Abs: 1.2 10*3/uL (ref 0.7–4.0)
MCH: 26.1 pg (ref 26.0–34.0)
MCHC: 27.3 g/dL — ABNORMAL LOW (ref 30.0–36.0)
MCV: 95.8 fL (ref 80.0–100.0)
Monocytes Absolute: 1.2 10*3/uL — ABNORMAL HIGH (ref 0.1–1.0)
Monocytes Relative: 11 %
Neutro Abs: 9.1 10*3/uL — ABNORMAL HIGH (ref 1.7–7.7)
Neutrophils Relative %: 78 %
Platelets: 278 10*3/uL (ref 150–400)
RBC: 3.56 MIL/uL — ABNORMAL LOW (ref 3.87–5.11)
RDW: 14.1 % (ref 11.5–15.5)
WBC: 11.6 10*3/uL — ABNORMAL HIGH (ref 4.0–10.5)
nRBC: 0 % (ref 0.0–0.2)

## 2018-09-07 LAB — PHOSPHORUS: Phosphorus: 2.5 mg/dL (ref 2.5–4.6)

## 2018-09-07 LAB — GLUCOSE, CAPILLARY: Glucose-Capillary: 79 mg/dL (ref 70–99)

## 2018-09-07 LAB — MAGNESIUM: Magnesium: 2.2 mg/dL (ref 1.7–2.4)

## 2018-09-07 MED ORDER — PREDNISONE 5 MG PO TABS: 40.0000 mg | ORAL_TABLET | Freq: Every day | ORAL | 0 refills | Status: AC

## 2018-09-07 MED ORDER — ONDANSETRON HCL 4 MG/2ML IJ SOLN
4.0000 mg | Freq: Three times a day (TID) | INTRAMUSCULAR | Status: DC | PRN
  Administered 2018-09-07: 4 mg via INTRAVENOUS
  Filled 2018-09-07: qty 2

## 2018-09-07 MED ORDER — SACCHAROMYCES BOULARDII 250 MG PO CAPS: 250.0000 mg | ORAL_CAPSULE | Freq: Two times a day (BID) | ORAL | 0 refills | Status: AC

## 2018-09-07 MED ORDER — KETOROLAC TROMETHAMINE 30 MG/ML IJ SOLN
15.0000 mg | Freq: Once | INTRAMUSCULAR | Status: AC
  Administered 2018-09-07: 15 mg via INTRAVENOUS
  Filled 2018-09-07: qty 1

## 2018-09-07 MED ORDER — VITAMIN B-1 100 MG PO TABS: 100.0000 mg | ORAL_TABLET | Freq: Every day | ORAL | Status: DC

## 2018-09-07 MED ORDER — LOPERAMIDE HCL 2 MG PO TABS: 2.0000 mg | ORAL_TABLET | Freq: Two times a day (BID) | ORAL | 0 refills | Status: AC | PRN

## 2018-09-07 NOTE — Discharge Summary (Signed)
Physician Discharge Summary  Sarah Chan WUJ:[id number] DOB: 1966/04/17 DOA: 08/28/2018  PCP: Kirstie Peri, MD  Admit date: 08/28/2018 Discharge date: 09/07/2018  Admitted From: home Disposition:  home  Recommendations for Outpatient Follow-up:  1. Follow up with PCP in 1-2 weeks  Home Health: PT Equipment/Devices: none  Discharge Condition: stable CODE STATUS: Full code Diet recommendation: regular  HPI: Per Dr. Carlota Raspberry, Sarah Chan is a 52 y.o. female who has a PMH of COPD on chronic O2, GERD, HTN, fibromyalgia, anxiety, depression, back pain s/p lumbar kyphoplasty 08/27/18.  She was taken to [facility] 11/21 after she was found partially unresponsive on the floor of her home.  She had a lumbar kyphoplasty the day prior.  She apparently had been prescribed 270 tables of 1mg  xanax at the beginning of October. She was given narcan by EMS and had minimal response.  In the ED, she was found to have severe hypercapnic and hypoxemic respiratory failure (7.17 / 120 / 52) and required intubation due to AMS and inability to protect the airway.  UDS was positive for benzos, otherwise negative. She was later transferred to [facility] for further evaluation and management.  Hospital Course:  Principal problem Acute on chronic hypercapnic and hypoxemic respiratory failure due to COPD exacerbation -patient initially presented to [facility] ER, was intubated and transferred to [facility], ICU.  There was a reported fever of 101.9.  She was placed on supportive treatment with oxygen, nebulizers, steroids, antibiotics and eventually improved.  She was transferred to the hospitalist service on 11/30, continue to improve, mobilizing more and able to work well with physical therapy.  Her wheezing has resolved, her respiratory status has returned to baseline of 3 L nasal cannula which she has at home, she is feeling a lot better, stronger, tolerating a diet well and will be discharged home in stable condition  with a prednisone taper and close outpatient follow-up.  She is to continue her home O2, home nocturnal BiPAP as well as home regimen for her COPD along with a prednisone taper.  Additional problems  Back pain -Status post lumbar kyphoplasty on 11/20, PRN analgesia Intermittent post ICU delirium -resolved History of fibromyalgia, depression, anxiety -Continue supportive treatment, Remeron, Vilazodone Leukocytosis -Probably related to steroids, white count is improving Anemia of chronic disease -Hemoglobin stable, no bleeding   Discharge Diagnoses:  Active Problems:   Acute on chronic respiratory failure with hypercapnia (HCC)   History of benzodiazepine use   S/P kyphoplasty   Endotracheal tube present   Encephalopathy acute   Acute respiratory failure with hypercapnia (HCC)   Tremor   Fever   Pressure injury of skin     Discharge Instructions   Allergies as of 09/07/2018      Reactions   Dicyclomine Nausea Only   Doxycycline Nausea Only   Trintellix [vortioxetine] Nausea Only, Other (See Comments)   constipation      Medication List    STOP taking these medications   furosemide 20 MG tablet Commonly known as:  LASIX   hydrALAZINE 25 MG tablet Commonly known as:  APRESOLINE     TAKE these medications   albuterol (2.5 MG/3ML) 0.083% nebulizer solution Commonly known as:  PROVENTIL Take 2.5 mg by nebulization every 6 (six) hours as needed for wheezing or shortness of breath.   albuterol 108 (90 Base) MCG/ACT inhaler Commonly known as:  PROVENTIL HFA;VENTOLIN HFA Inhale 2 puffs into the lungs every 6 (six) hours as needed for wheezing or shortness of  breath.   ALPRAZolam 1 MG tablet Commonly known as:  XANAX Take 1 tablet (1 mg total) by mouth 3 (three) times daily.   amLODipine 5 MG tablet Commonly known as:  NORVASC TAKE 1 TABLET DAILY   baclofen 10 MG tablet Commonly known as:  LIORESAL Take 10 mg by mouth 2 (two) times daily as needed for muscle  spasms.   budesonide-formoterol 160-4.5 MCG/ACT inhaler Commonly known as:  SYMBICORT Inhale 2 puffs into the lungs 2 (two) times daily.   cyanocobalamin 1000 MCG/ML injection Commonly known as:  (VITAMIN B-12) Inject 1,000 mcg into the muscle every 14 (fourteen) days.   denosumab 60 MG/ML Soln injection Commonly known as:  PROLIA Inject 60 mg into the skin every 6 (six) months. Administer in upper arm, thigh, or abdomen   dexlansoprazole 60 MG capsule Commonly known as:  DEXILANT Take 60 mg by mouth daily.   fluticasone 50 MCG/ACT nasal spray Commonly known as:  FLONASE Place 1 spray into both nostrils daily.   gabapentin 600 MG tablet Commonly known as:  NEURONTIN Take 600 mg by mouth 3 (three) times daily.   HYDROcodone-acetaminophen 5-325 MG tablet Commonly known as:  NORCO/VICODIN Take 1 tablet by mouth every 4 (four) hours as needed for moderate pain.   INCRUSE ELLIPTA 62.5 MCG/INH Aepb Generic drug:  umeclidinium bromide Inhale 1 puff into the lungs daily.   IRON PO Take by mouth daily.   loperamide 2 MG tablet Commonly known as:  IMODIUM A-D Take 1 tablet (2 mg total) by mouth 2 (two) times daily as needed for diarrhea or loose stools.   meloxicam 15 MG tablet Commonly known as:  MOBIC Take 15 mg by mouth daily.   metoprolol succinate 25 MG 24 hr tablet Commonly known as:  TOPROL-XL Take 1 tablet (25 mg total) by mouth daily.   mirtazapine 15 MG tablet Commonly known as:  REMERON Take 1 tablet (15 mg total) by mouth at bedtime.   omega-3 acid ethyl esters 1 g capsule Commonly known as:  LOVAZA Take by mouth 2 (two) times daily.   OXYGEN Inhale 3 L into the lungs.   predniSONE 5 MG tablet Commonly known as:  DELTASONE Take 8 tablets (40 mg total) by mouth daily. 40 mg daily x 2 days then 30 mg daily x 2 days then 20 mg daily x 2 days then 10mg  daily x 2 days then back to prior dose of 5 mg daily What changed:    how much to take  additional  instructions   saccharomyces boulardii 250 MG capsule Commonly known as:  FLORASTOR Take 1 capsule (250 mg total) by mouth 2 (two) times daily.   tiotropium 18 MCG inhalation capsule Commonly known as:  SPIRIVA Place 18 mcg into inhaler and inhale daily.   Vilazodone HCl 40 MG Tabs Commonly known as:  VIIBRYD Take 1 tablet (40 mg total) by mouth daily.      Follow-up Information    [facility], [facility] Follow up.   Why:  For home health services. Contact information: [address] [place] [address] [phone]           Consultations:  PCCM  Procedures/Studies:  2D echo  Study Conclusions - Left ventricle: The cavity size was normal. Wall thickness was normal. Systolic function was normal. The estimated ejection fraction was in the range of 55% to 60%. Wall motion was normal; there were no regional wall motion abnormalities. Doppler parameters are consistent with abnormal  left ventricular relaxation (grade 1 diastolic dysfunction).   EEG Impression:  This is an abnormal EEG.  There is moderate generalized slowing of brain activity and encephalopathic waveforms typical of a metabolic, toxic, infectious, or hypoxic etiology.  The patient is not in non-convulsive status epilepticus.  Clinical correlation is recommended.  Ct Head Wo Contrast  Result Date: 08/31/2018 CLINICAL DATA:  Patient status post arrest. Down for unknown time. Seizure-like activity. EXAM: CT HEAD WITHOUT CONTRAST TECHNIQUE: Contiguous axial images were obtained from the base of the skull through the vertex without intravenous contrast. COMPARISON:  None. FINDINGS: Brain: No subdural, epidural, or subarachnoid hemorrhage. No mass effect or midline shift. Ventricles and sulci are unremarkable. Cerebellum, brainstem, and basal cisterns are normal. No acute cortical ischemia or infarct. No midline shift. Vascular: No hyperdense vessel or unexpected calcification. Skull: Normal. Negative  for fracture or focal lesion. Sinuses/Orbits: No acute finding. Other: None. IMPRESSION: 1. No acute intracranial abnormalities. Electronically Signed   By: Gerome Sam III M.D   On: 08/31/2018 18:56   Dg Chest Port 1 View  Result Date: 09/03/2018 CLINICAL DATA:  Respiratory failure EXAM: PORTABLE CHEST 1 VIEW COMPARISON:  09/02/2018 FINDINGS: Cardiac shadows within normal limits. Changes consistent with multilevel vertebral augmentation are again noted. 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 No focal infiltrate or sizable effusion is seen. No bony abnormality is noted. Endotracheal tube and nasogastric catheter are again seen and stable. IMPRESSION: Overall stable appearance of the chest.  No acute abnormality noted. 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 Electronically Signed   By: Alcide Clever M.D.   On: 09/03/2018 09:02   Dg Chest Port 1 View  Result Date: 09/02/2018 CLINICAL DATA:  52 year old female with history of intubation. EXAM: PORTABLE CHEST 1 VIEW COMPARISON:  Chest x-ray 09/02/2018. FINDINGS: An endotracheal tube is in place with tip 3.4 cm above the carina. A nasogastric tube is seen extending into the stomach, however, the tip of the nasogastric tube extends below the lower margin of the image. Lung volumes are low. No consolidative airspace disease. No pleural effusions. No evidence of pulmonary edema. Heart size is normal. Upper mediastinal contours are within normal limits. Post vertebroplasty changes throughout the mid to lower thoracic spine. Numerous branching cement densities throughout the lungs bilaterally, compatible with widespread embolization of cement material, similar to prior studies. IMPRESSION: 1. Support apparatus and postprocedural changes, as above. 2. No radiographic evidence of acute cardiopulmonary disease. 3. 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  Electronically Signed   By: Trudie Reed M.D.   On: 09/02/2018 19:13   Dg Chest Port 1 View  Result Date: 09/02/2018 CLINICAL DATA:  Acute respiratory failure EXAM: PORTABLE CHEST 1 VIEW COMPARISON:  08/31/2018 FINDINGS: Cardiac shadow is within normal limits. The lungs are well aerated bilaterally with multifocal areas of cement deposition from previous vertebral augmentations. Multilevel vertebral augmentation is noted. Endotracheal tube and nasogastric catheter are seen in satisfactory position. No focal infiltrate is seen. Small left pleural effusion is noted. IMPRESSION: Stable cement emboli bilaterally. Small left pleural effusions stable from the previous exam. No new infiltrate is seen. Electronically Signed   By: Alcide Clever M.D.   On: 09/02/2018 08:34   Dg Chest Port 1 View  Result Date: 08/31/2018 CLINICAL DATA:  52 y/o  F; mechanical complication of respirator. EXAM: PORTABLE CHEST 1 VIEW COMPARISON:  08/30/2018 chest radiograph. FINDINGS: Stable normal cardiac silhouette given projection and technique. Transcutaneous  pacing pads noted. Endotracheal tube tip projects 2.9 cm above the carina. Enteric tube tip extends below the field into the abdomen. No consolidation or pneumothorax. Small left pleural effusion. Stable numerous cement emboli in the lungs. Emphysema. Multiple spinal compression deformities post kyphoplasty. No acute osseous abnormality is evident. IMPRESSION: Small left pleural effusion. Stable endotracheal and enteric tubes. No focal consolidation. Electronically Signed   By: Mitzi Hansen M.D.   On: 08/31/2018 17:29   Dg Chest Port 1 View  Result Date: 08/30/2018 CLINICAL DATA:  Respiratory failure EXAM: PORTABLE CHEST 1 VIEW COMPARISON:  Chest radiograph 08/29/2018 FINDINGS: ET tube terminates in the mid trachea. Enteric tube courses inferior to the diaphragm. Stable cardiac and mediastinal contours. Elevation left hemidiaphragm, stable. No large area  pulmonary consolidation. No pleural effusion or pneumothorax. Multilevel kyphoplasty material. IMPRESSION: No acute cardiopulmonary process. Electronically Signed   By: Annia Belt M.D.   On: 08/30/2018 08:21   Dg Chest Port 1 View  Result Date: 08/29/2018 CLINICAL DATA:  Respiratory failure. EXAM: PORTABLE CHEST 1 VIEW COMPARISON:  Radiograph of August 28, 2018. FINDINGS: Endotracheal tube has been repositioned with distal tip 4 cm above the carina. Nasogastric tube is seen entering the stomach. No pneumothorax is noted. Left lung is clear. Minimal right basilar subsegmental atelectasis is noted with small pleural effusion. Status post kyphoplasty at multiple levels of the thoracic spine. IMPRESSION: Endotracheal tube is in grossly good position currently. Nasogastric tube is stable. Minimal right basilar subsegmental atelectasis is noted with small right pleural effusion. Electronically Signed   By: Lupita Raider, M.D.   On: 08/29/2018 08:51   Dg Chest Port 1 View  Result Date: 08/28/2018 CLINICAL DATA:  Evaluate ETT. EXAM: PORTABLE CHEST 1 VIEW COMPARISON:  August 28, 2018 FINDINGS: The ETT terminates near the carina. Recommend withdrawing 2 cm. The NG tube terminates below today's film. No other interval changes. IMPRESSION: 1. The ETT terminates near the carina. Recommend withdrawing 2 cm. The NG tube terminates below today's film. These results will be called to the ordering clinician or representative by the Radiologist Assistant, and communication documented in the PACS or zVision Dashboard. Electronically Signed   By: Gerome Sam III M.D   On: 08/28/2018 23:56   Dg Swallowing Func-speech Pathology  Result Date: 09/05/2018 Objective Swallowing Evaluation: Type of Study: MBS-Modified Barium Swallow Study  Patient Details Name: Sarah Chan MRN: [id number] Date of Birth: 1966/07/02 Today's Date: 09/05/2018 Time: SLP Start Time (ACUTE ONLY): 1257 -SLP Stop Time (ACUTE ONLY): 1307  SLP Time Calculation (min) (ACUTE ONLY): 10 min Past Medical History: Past Medical History: Diagnosis Date . Anxiety  . Back pain  . COPD (chronic obstructive pulmonary disease) (HCC)   Oxygen dependent . Depression  . Fibromyalgia  . GERD (gastroesophageal reflux disease)  . HTN (hypertension), malignant 10/27/2014 . IBS (irritable bowel syndrome)  . Neck pain  . Pneumonia  . Takotsubo cardiomyopathy   a. NSTEMI 04/2013 secondary to Takotsubo's CM - normal cors, EF 30%. Past Surgical History: Past Surgical History: Procedure Laterality Date . ABDOMINAL HYSTERECTOMY   . C-Secition   . CARDIAC CATHETERIZATION   . CHOLECYSTECTOMY   . LEFT HEART CATHETERIZATION WITH CORONARY ANGIOGRAM N/A 05/04/2013  Procedure: LEFT HEART CATHETERIZATION WITH CORONARY ANGIOGRAM;  Surgeon: Wendall Stade, MD;  Location: [facility] CATH LAB;  Service: Cardiovascular;  Laterality: N/A; HPI: Sarah Chan is a 52 y.o. female who was transferred from [facility] with acute on chronic hypercapnic and hypoxemic respiratory failure requiring  mechanical ventilation.  Had just had lumbar kyphoplasty 1 day prior. Intubated from 11/21-11/27. MBS recommended.  No data recorded Assessment / Plan / Recommendation CHL IP CLINICAL IMPRESSIONS 09/05/2018 Clinical Impression Pt's pharyngeal swallow function is mildly impaired marked by silent aspiration x1 with thin using straw and mild residue. Mildy decreased coordination and sensation as thin via straw reached pyriform sinuses and aspirated during the swallow. Mod-max volume of cracker was retained in vallecular due to incomplete epiglottic inversion with partial clearance via multiple swallows and transient penetration with th in via cup that completely cleared. Visible mild dyspnea throughout with recommendation of D3, nectar, rest breaks NO straws and rest breaks when needed. ST to follow.      SLP Visit Diagnosis Dysphagia, pharyngeal phase (R13.13) Attention and concentration deficit following -- Frontal lobe  and executive function deficit following -- Impact on safety and function Mild aspiration risk   CHL IP TREATMENT RECOMMENDATION 09/05/2018 Treatment Recommendations Therapy as outlined in treatment plan below   Prognosis 09/05/2018 Prognosis for Safe Diet Advancement Good Barriers to Reach Goals -- Barriers/Prognosis Comment -- CHL IP DIET RECOMMENDATION 09/05/2018 SLP Diet Recommendations Dysphagia 3 (Mech soft) solids;Thin liquid Liquid Administration via Cup;No straw Medication Administration Whole meds with puree Compensations Slow rate;Small sips/bites Postural Changes Remain semi-upright after after feeds/meals (Comment)   CHL IP OTHER RECOMMENDATIONS 09/05/2018 Recommended Consults -- Oral Care Recommendations Oral care BID Other Recommendations --   CHL IP FOLLOW UP RECOMMENDATIONS 09/05/2018 Follow up Recommendations Skilled Nursing facility   [facility] IP FREQUENCY AND DURATION 09/05/2018 Speech Therapy Frequency (ACUTE ONLY) min 2x/week Treatment Duration 2 weeks      CHL IP ORAL PHASE 09/05/2018 Oral Phase WFL Oral - Pudding Teaspoon -- Oral - Pudding Cup -- Oral - Honey Teaspoon -- Oral - Honey Cup -- Oral - Nectar Teaspoon -- Oral - Nectar Cup WFL Oral - Nectar Straw -- Oral - Thin Teaspoon -- Oral - Thin Cup WFL Oral - Thin Straw WFL Oral - Puree -- Oral - Mech Soft -- Oral - Regular WFL Oral - Multi-Consistency -- Oral - Pill -- Oral Phase - Comment --  CHL IP PHARYNGEAL PHASE 09/05/2018 Pharyngeal Phase Impaired Pharyngeal- Pudding Teaspoon -- Pharyngeal -- Pharyngeal- Pudding Cup -- Pharyngeal -- Pharyngeal- Honey Teaspoon -- Pharyngeal -- Pharyngeal- Honey Cup -- Pharyngeal -- Pharyngeal- Nectar Teaspoon -- Pharyngeal -- Pharyngeal- Nectar Cup WFL Pharyngeal -- Pharyngeal- Nectar Straw -- Pharyngeal -- Pharyngeal- Thin Teaspoon -- Pharyngeal -- Pharyngeal- Thin Cup Penetration/Aspiration during swallow;Pharyngeal residue - valleculae Pharyngeal Material enters airway, remains ABOVE vocal cords then  ejected out Pharyngeal- Thin Straw Penetration/Aspiration during swallow;Pharyngeal residue - pyriform Pharyngeal Material enters airway, passes BELOW cords without attempt by patient to eject out (silent aspiration) Pharyngeal- Puree -- Pharyngeal -- Pharyngeal- Mechanical Soft -- Pharyngeal -- Pharyngeal- Regular Pharyngeal residue - valleculae;Reduced epiglottic inversion Pharyngeal -- Pharyngeal- Multi-consistency -- Pharyngeal -- Pharyngeal- Pill -- Pharyngeal -- Pharyngeal Comment --  CHL IP CERVICAL ESOPHAGEAL PHASE 09/05/2018 Cervical Esophageal Phase WFL Pudding Teaspoon -- Pudding Cup -- Honey Teaspoon -- Honey Cup -- Nectar Teaspoon -- Nectar Cup -- Nectar Straw -- Thin Teaspoon -- Thin Cup -- Thin Straw -- Puree -- Mechanical Soft -- Regular -- Multi-consistency -- Pill -- Cervical Esophageal Comment -- Royce Macadamia 09/05/2018, 1:37 PM Breck Coons Litaker M.Ed Sports administrator Pager [phone] Office [phone]                Subjective: - no chest pain, shortness of breath, no  abdominal pain, nausea or vomiting.   Discharge Exam: Vitals:   09/07/18 0757 09/07/18 1243  BP: 129/82   Pulse: 90   Resp: 16   Temp: 98.6 F (37 C)   SpO2: 100% 98%    General: Pt is alert, awake, not in acute distress Cardiovascular: RRR, S1/S2 +, no rubs, no gallops Respiratory: CTA bilaterally, no wheezing, no rhonchi, overall decreased breath sounds Abdominal: Soft, NT, ND, bowel sounds + Extremities: no edema, no cyanosis    The results of significant diagnostics from this hospitalization (including imaging, microbiology, ancillary and laboratory) are listed below for reference.     Microbiology: Recent Results (from the past 240 hour(s))  MRSA PCR Screening     Status: None   Collection Time: 08/28/18 11:25 PM  Result Value Ref Range Status   MRSA by PCR NEGATIVE NEGATIVE Final    Comment:        The GeneXpert MRSA Assay (FDA approved for NASAL specimens only),  is one component of a comprehensive MRSA colonization surveillance program. It is not intended to diagnose MRSA infection nor to guide or monitor treatment for MRSA infections. Performed at [facility], [address], [place], [place] [address]   Culture, blood (routine x 2)     Status: None   Collection Time: 08/29/18 12:25 AM  Result Value Ref Range Status   Specimen Description BLOOD LEFT HAND  Final   Special Requests   Final    BOTTLES DRAWN AEROBIC ONLY Blood Culture adequate volume   Culture   Final    NO GROWTH 5 DAYS Performed at [facility], [address] [place], [place] [address]    Report Status 09/03/2018 FINAL  Final  Culture, blood (routine x 2)     Status: None   Collection Time: 08/29/18 12:38 AM  Result Value Ref Range Status   Specimen Description BLOOD RIGHT ARM  Final   Special Requests   Final    BOTTLES DRAWN AEROBIC ONLY Blood Culture results may not be optimal due to an inadequate volume of blood received in culture bottles   Culture   Final    NO GROWTH 5 DAYS Performed at [facility], [address] [place], [place] [address]    Report Status 09/03/2018 FINAL  Final  Urine Culture     Status: Abnormal   Collection Time: 08/29/18 12:38 AM  Result Value Ref Range Status   Specimen Description URINE, RANDOM  Final   Special Requests NONE  Final   Culture (A)  Final    <10,000 COLONIES/mL INSIGNIFICANT GROWTH Performed at [facility], [address] [place], [place] [address]    Report Status 08/30/2018 FINAL  Final  Culture, respiratory (tracheal aspirate)     Status: None   Collection Time: 08/29/18  2:18 AM  Result Value Ref Range Status   Specimen Description TRACHEAL ASPIRATE  Final   Special Requests NONE  Final   Gram Stain   Final    ABUNDANT WBC PRESENT,BOTH PMN AND MONONUCLEAR RARE GRAM POSITIVE COCCI    Culture   Final    FEW Consistent with normal respiratory flora. Performed at [facility], [address], [place], [place] [address]    Report Status 08/31/2018 FINAL  Final     Labs: BNP (last 3 results) No results for input(s): BNP in the last 8760 hours. Basic Metabolic Panel: Recent Labs  Lab 09/03/18 0300 09/04/18 0300 09/04/18  1034 09/05/18 0048 09/06/18 0553 09/07/18 0630  NA 141 <102* 139 138 139  --   K 4.6 3.1* 3.9 4.2 3.4*  --   CL 100 <65* 93* 95* 96*  --   CO2 35* 15* 33* 30 32  --   GLUCOSE 163* 87 92 103* 109*  --   BUN 27* 24* 27* 31* 20  --   CREATININE 0.60 0.70 0.69 0.72 0.71  --   CALCIUM 6.7* 5.3* 7.4* 7.4* 7.4*  --   MG 1.9 0.9* 2.1 2.2 2.3 2.2  PHOS 2.8 2.3* 3.6 3.5 3.2 2.5   Liver Function Tests: Recent Labs  Lab 09/06/18 0553  AST 20  ALT 18  ALKPHOS 60  BILITOT 0.7  PROT 6.3*  ALBUMIN 3.5   No results for input(s): LIPASE, AMYLASE in the last 168 hours. No results for input(s): AMMONIA in the last 168 hours. CBC: Recent Labs  Lab 09/03/18 0300 09/04/18 0300 09/05/18 0048 09/06/18 0553 09/07/18 0630  WBC 15.4* 20.2* 20.0* 15.2* 11.6*  NEUTROABS  --   --  18.0* 13.3* 9.1*  HGB 9.1* 12.0 11.1* 10.1* 9.3*  HCT 33.9* 41.4 38.7 36.4 34.1*  MCV 94.4 89.4 91.9 94.1 95.8  PLT 219 223 350 316 278   Cardiac Enzymes: Recent Labs  Lab 09/05/18 0048  TROPONINI 0.03*   BNP: Invalid input(s): POCBNP CBG: Recent Labs  Lab 09/06/18 1149 09/06/18 1636 09/06/18 1724 09/06/18 2051 09/07/18 0759  GLUCAP 111* 75 106* 123* 79   D-Dimer No results for input(s): DDIMER in the last 72 hours. Hgb A1c No results for input(s): HGBA1C in the last 72 hours. Lipid Profile Recent Labs    09/05/18 0946  TRIG 142   Thyroid function studies No results for input(s): TSH, T4TOTAL, T3FREE, THYROIDAB in the last 72 hours.  Invalid input(s): FREET3 Anemia work up No results for input(s): VITAMINB12, FOLATE, FERRITIN, TIBC, IRON, RETICCTPCT in the last 72 hours. Urinalysis    Component Value Date/Time   COLORURINE YELLOW  08/31/2018 2040   APPEARANCEUR CLEAR 08/31/2018 2040   LABSPEC 1.026 08/31/2018 2040   PHURINE 5.0 08/31/2018 2040   GLUCOSEU NEGATIVE 08/31/2018 2040   HGBUR SMALL (A) 08/31/2018 2040   BILIRUBINUR NEGATIVE 08/31/2018 2040   KETONESUR 20 (A) 08/31/2018 2040   PROTEINUR 30 (A) 08/31/2018 2040   NITRITE NEGATIVE 08/31/2018 2040   LEUKOCYTESUR NEGATIVE 08/31/2018 2040   Sepsis Labs Invalid input(s): PROCALCITONIN,  WBC,  LACTICIDVEN   Time coordinating discharge: 40 minutes  SIGNED:  Pamella Pertostin Gherghe, MD  [facility] 09/07/2018, 3:18 PM Pager [phone]  If 7PM-7AM, please contact night-coverage [email or web address] Password [email or web address]

## 2018-09-07 NOTE — Care Management Note (Signed)
Case Management Note  Patient Details  Name: Sarah RandRhonda H Foerster MRN: [id number] Date of Birth: 04-Mar-1966  Subjective/Objective:                    Action/Plan:  Spoke w patient at bedside. She would like to use [facility], refrral accepted. [employer] will be delivering a tank for patient to use for transportation home in the next 1-2 hours. Patient has friend at bedside who will drive home.   Expected Discharge Date:  09/07/18               Expected Discharge Plan:  Home w Home Health Services  In-House Referral:     Discharge planning Services  CM Consult  Post Acute Care Choice:  Home Health Choice offered to:  Patient  DME Arranged:    DME Agency:     HH Arranged:  PT HH Agency:  [facility]  Status of Service:  Completed, signed off  If discussed at Long Length of Stay Meetings, dates discussed:    Additional Comments:  Lawerance SabalDebbie Tommie Dejoseph, RN 09/07/2018, 12:08 PM

## 2018-09-09 DIAGNOSIS — F419 Anxiety disorder, unspecified: Secondary | ICD-10-CM | POA: Diagnosis not present

## 2018-09-09 DIAGNOSIS — J441 Chronic obstructive pulmonary disease with (acute) exacerbation: Secondary | ICD-10-CM | POA: Diagnosis not present

## 2018-09-09 DIAGNOSIS — Z87891 Personal history of nicotine dependence: Secondary | ICD-10-CM | POA: Diagnosis not present

## 2018-09-09 DIAGNOSIS — I10 Essential (primary) hypertension: Secondary | ICD-10-CM | POA: Diagnosis not present

## 2018-09-09 DIAGNOSIS — M6281 Muscle weakness (generalized): Secondary | ICD-10-CM | POA: Diagnosis not present

## 2018-09-09 DIAGNOSIS — F329 Major depressive disorder, single episode, unspecified: Secondary | ICD-10-CM | POA: Diagnosis not present

## 2018-09-09 DIAGNOSIS — Z79891 Long term (current) use of opiate analgesic: Secondary | ICD-10-CM | POA: Diagnosis not present

## 2018-09-09 DIAGNOSIS — Z791 Long term (current) use of non-steroidal anti-inflammatories (NSAID): Secondary | ICD-10-CM | POA: Diagnosis not present

## 2018-09-09 DIAGNOSIS — M797 Fibromyalgia: Secondary | ICD-10-CM | POA: Diagnosis not present

## 2018-09-09 DIAGNOSIS — Z7952 Long term (current) use of systemic steroids: Secondary | ICD-10-CM | POA: Diagnosis not present

## 2018-09-09 DIAGNOSIS — Z4789 Encounter for other orthopedic aftercare: Secondary | ICD-10-CM | POA: Diagnosis not present

## 2018-09-09 DIAGNOSIS — J9622 Acute and chronic respiratory failure with hypercapnia: Secondary | ICD-10-CM | POA: Diagnosis not present

## 2018-09-09 DIAGNOSIS — J9621 Acute and chronic respiratory failure with hypoxia: Secondary | ICD-10-CM | POA: Diagnosis not present

## 2018-09-09 DIAGNOSIS — Z9981 Dependence on supplemental oxygen: Secondary | ICD-10-CM | POA: Diagnosis not present

## 2018-09-11 ENCOUNTER — Other Ambulatory Visit: Payer: Self-pay | Admitting: Cardiovascular Disease

## 2018-09-11 DIAGNOSIS — J9622 Acute and chronic respiratory failure with hypercapnia: Secondary | ICD-10-CM | POA: Diagnosis not present

## 2018-09-11 DIAGNOSIS — I10 Essential (primary) hypertension: Secondary | ICD-10-CM | POA: Diagnosis not present

## 2018-09-11 DIAGNOSIS — F329 Major depressive disorder, single episode, unspecified: Secondary | ICD-10-CM | POA: Diagnosis not present

## 2018-09-11 DIAGNOSIS — J441 Chronic obstructive pulmonary disease with (acute) exacerbation: Secondary | ICD-10-CM | POA: Diagnosis not present

## 2018-09-11 DIAGNOSIS — Z4789 Encounter for other orthopedic aftercare: Secondary | ICD-10-CM | POA: Diagnosis not present

## 2018-09-11 DIAGNOSIS — J9621 Acute and chronic respiratory failure with hypoxia: Secondary | ICD-10-CM | POA: Diagnosis not present

## 2018-09-12 ENCOUNTER — Other Ambulatory Visit: Payer: Self-pay | Admitting: *Deleted

## 2018-09-12 DIAGNOSIS — J9622 Acute and chronic respiratory failure with hypercapnia: Secondary | ICD-10-CM | POA: Diagnosis not present

## 2018-09-12 DIAGNOSIS — J441 Chronic obstructive pulmonary disease with (acute) exacerbation: Secondary | ICD-10-CM | POA: Diagnosis not present

## 2018-09-12 DIAGNOSIS — I10 Essential (primary) hypertension: Secondary | ICD-10-CM | POA: Diagnosis not present

## 2018-09-12 DIAGNOSIS — J9621 Acute and chronic respiratory failure with hypoxia: Secondary | ICD-10-CM | POA: Diagnosis not present

## 2018-09-12 DIAGNOSIS — Z4789 Encounter for other orthopedic aftercare: Secondary | ICD-10-CM | POA: Diagnosis not present

## 2018-09-12 DIAGNOSIS — F329 Major depressive disorder, single episode, unspecified: Secondary | ICD-10-CM | POA: Diagnosis not present

## 2018-09-12 MED ORDER — METOPROLOL SUCCINATE ER 25 MG PO TB24: 25.0000 mg | ORAL_TABLET | Freq: Every day | ORAL | 1 refills | Status: DC

## 2018-09-15 DIAGNOSIS — Z4789 Encounter for other orthopedic aftercare: Secondary | ICD-10-CM | POA: Diagnosis not present

## 2018-09-15 DIAGNOSIS — J9622 Acute and chronic respiratory failure with hypercapnia: Secondary | ICD-10-CM | POA: Diagnosis not present

## 2018-09-15 DIAGNOSIS — J9621 Acute and chronic respiratory failure with hypoxia: Secondary | ICD-10-CM | POA: Diagnosis not present

## 2018-09-15 DIAGNOSIS — F329 Major depressive disorder, single episode, unspecified: Secondary | ICD-10-CM | POA: Diagnosis not present

## 2018-09-15 DIAGNOSIS — J441 Chronic obstructive pulmonary disease with (acute) exacerbation: Secondary | ICD-10-CM | POA: Diagnosis not present

## 2018-09-15 DIAGNOSIS — I10 Essential (primary) hypertension: Secondary | ICD-10-CM | POA: Diagnosis not present

## 2018-09-16 DIAGNOSIS — J441 Chronic obstructive pulmonary disease with (acute) exacerbation: Secondary | ICD-10-CM | POA: Diagnosis not present

## 2018-09-16 DIAGNOSIS — Z4789 Encounter for other orthopedic aftercare: Secondary | ICD-10-CM | POA: Diagnosis not present

## 2018-09-16 DIAGNOSIS — I10 Essential (primary) hypertension: Secondary | ICD-10-CM | POA: Diagnosis not present

## 2018-09-16 DIAGNOSIS — J9622 Acute and chronic respiratory failure with hypercapnia: Secondary | ICD-10-CM | POA: Diagnosis not present

## 2018-09-16 DIAGNOSIS — J9621 Acute and chronic respiratory failure with hypoxia: Secondary | ICD-10-CM | POA: Diagnosis not present

## 2018-09-16 DIAGNOSIS — F329 Major depressive disorder, single episode, unspecified: Secondary | ICD-10-CM | POA: Diagnosis not present

## 2018-09-17 DIAGNOSIS — J9622 Acute and chronic respiratory failure with hypercapnia: Secondary | ICD-10-CM | POA: Diagnosis not present

## 2018-09-17 DIAGNOSIS — J9621 Acute and chronic respiratory failure with hypoxia: Secondary | ICD-10-CM | POA: Diagnosis not present

## 2018-09-17 DIAGNOSIS — J441 Chronic obstructive pulmonary disease with (acute) exacerbation: Secondary | ICD-10-CM | POA: Diagnosis not present

## 2018-09-17 DIAGNOSIS — I10 Essential (primary) hypertension: Secondary | ICD-10-CM | POA: Diagnosis not present

## 2018-09-17 DIAGNOSIS — F329 Major depressive disorder, single episode, unspecified: Secondary | ICD-10-CM | POA: Diagnosis not present

## 2018-09-17 DIAGNOSIS — Z4789 Encounter for other orthopedic aftercare: Secondary | ICD-10-CM | POA: Diagnosis not present

## 2018-09-18 DIAGNOSIS — Z4789 Encounter for other orthopedic aftercare: Secondary | ICD-10-CM | POA: Diagnosis not present

## 2018-09-18 DIAGNOSIS — J9621 Acute and chronic respiratory failure with hypoxia: Secondary | ICD-10-CM | POA: Diagnosis not present

## 2018-09-18 DIAGNOSIS — J9622 Acute and chronic respiratory failure with hypercapnia: Secondary | ICD-10-CM | POA: Diagnosis not present

## 2018-09-18 DIAGNOSIS — I10 Essential (primary) hypertension: Secondary | ICD-10-CM | POA: Diagnosis not present

## 2018-09-18 DIAGNOSIS — J441 Chronic obstructive pulmonary disease with (acute) exacerbation: Secondary | ICD-10-CM | POA: Diagnosis not present

## 2018-09-18 DIAGNOSIS — F329 Major depressive disorder, single episode, unspecified: Secondary | ICD-10-CM | POA: Diagnosis not present

## 2018-09-19 DIAGNOSIS — J9621 Acute and chronic respiratory failure with hypoxia: Secondary | ICD-10-CM | POA: Diagnosis not present

## 2018-09-19 DIAGNOSIS — I10 Essential (primary) hypertension: Secondary | ICD-10-CM | POA: Diagnosis not present

## 2018-09-19 DIAGNOSIS — J9622 Acute and chronic respiratory failure with hypercapnia: Secondary | ICD-10-CM | POA: Diagnosis not present

## 2018-09-19 DIAGNOSIS — J441 Chronic obstructive pulmonary disease with (acute) exacerbation: Secondary | ICD-10-CM | POA: Diagnosis not present

## 2018-09-19 DIAGNOSIS — F329 Major depressive disorder, single episode, unspecified: Secondary | ICD-10-CM | POA: Diagnosis not present

## 2018-09-19 DIAGNOSIS — Z4789 Encounter for other orthopedic aftercare: Secondary | ICD-10-CM | POA: Diagnosis not present

## 2018-09-22 DIAGNOSIS — J441 Chronic obstructive pulmonary disease with (acute) exacerbation: Secondary | ICD-10-CM | POA: Diagnosis not present

## 2018-09-22 DIAGNOSIS — J9622 Acute and chronic respiratory failure with hypercapnia: Secondary | ICD-10-CM | POA: Diagnosis not present

## 2018-09-22 DIAGNOSIS — I10 Essential (primary) hypertension: Secondary | ICD-10-CM | POA: Diagnosis not present

## 2018-09-22 DIAGNOSIS — Z4789 Encounter for other orthopedic aftercare: Secondary | ICD-10-CM | POA: Diagnosis not present

## 2018-09-22 DIAGNOSIS — F329 Major depressive disorder, single episode, unspecified: Secondary | ICD-10-CM | POA: Diagnosis not present

## 2018-09-22 DIAGNOSIS — J9621 Acute and chronic respiratory failure with hypoxia: Secondary | ICD-10-CM | POA: Diagnosis not present

## 2018-09-23 ENCOUNTER — Ambulatory Visit: Payer: Self-pay | Admitting: Cardiovascular Disease

## 2018-09-23 DIAGNOSIS — F329 Major depressive disorder, single episode, unspecified: Secondary | ICD-10-CM | POA: Diagnosis not present

## 2018-09-23 DIAGNOSIS — I10 Essential (primary) hypertension: Secondary | ICD-10-CM | POA: Diagnosis not present

## 2018-09-23 DIAGNOSIS — Z4789 Encounter for other orthopedic aftercare: Secondary | ICD-10-CM | POA: Diagnosis not present

## 2018-09-23 DIAGNOSIS — J9622 Acute and chronic respiratory failure with hypercapnia: Secondary | ICD-10-CM | POA: Diagnosis not present

## 2018-09-23 DIAGNOSIS — J9621 Acute and chronic respiratory failure with hypoxia: Secondary | ICD-10-CM | POA: Diagnosis not present

## 2018-09-23 DIAGNOSIS — J441 Chronic obstructive pulmonary disease with (acute) exacerbation: Secondary | ICD-10-CM | POA: Diagnosis not present

## 2018-09-24 DIAGNOSIS — J9622 Acute and chronic respiratory failure with hypercapnia: Secondary | ICD-10-CM | POA: Diagnosis not present

## 2018-09-24 DIAGNOSIS — J9621 Acute and chronic respiratory failure with hypoxia: Secondary | ICD-10-CM | POA: Diagnosis not present

## 2018-09-24 DIAGNOSIS — I10 Essential (primary) hypertension: Secondary | ICD-10-CM | POA: Diagnosis not present

## 2018-09-24 DIAGNOSIS — Z4789 Encounter for other orthopedic aftercare: Secondary | ICD-10-CM | POA: Diagnosis not present

## 2018-09-24 DIAGNOSIS — J441 Chronic obstructive pulmonary disease with (acute) exacerbation: Secondary | ICD-10-CM | POA: Diagnosis not present

## 2018-09-24 DIAGNOSIS — F329 Major depressive disorder, single episode, unspecified: Secondary | ICD-10-CM | POA: Diagnosis not present

## 2018-09-25 DIAGNOSIS — I10 Essential (primary) hypertension: Secondary | ICD-10-CM | POA: Diagnosis not present

## 2018-09-25 DIAGNOSIS — F329 Major depressive disorder, single episode, unspecified: Secondary | ICD-10-CM | POA: Diagnosis not present

## 2018-09-25 DIAGNOSIS — J9622 Acute and chronic respiratory failure with hypercapnia: Secondary | ICD-10-CM | POA: Diagnosis not present

## 2018-09-25 DIAGNOSIS — J441 Chronic obstructive pulmonary disease with (acute) exacerbation: Secondary | ICD-10-CM | POA: Diagnosis not present

## 2018-09-25 DIAGNOSIS — J9621 Acute and chronic respiratory failure with hypoxia: Secondary | ICD-10-CM | POA: Diagnosis not present

## 2018-09-25 DIAGNOSIS — Z4789 Encounter for other orthopedic aftercare: Secondary | ICD-10-CM | POA: Diagnosis not present

## 2018-09-26 DIAGNOSIS — F329 Major depressive disorder, single episode, unspecified: Secondary | ICD-10-CM | POA: Diagnosis not present

## 2018-09-26 DIAGNOSIS — Z4789 Encounter for other orthopedic aftercare: Secondary | ICD-10-CM | POA: Diagnosis not present

## 2018-09-26 DIAGNOSIS — J441 Chronic obstructive pulmonary disease with (acute) exacerbation: Secondary | ICD-10-CM | POA: Diagnosis not present

## 2018-09-26 DIAGNOSIS — I10 Essential (primary) hypertension: Secondary | ICD-10-CM | POA: Diagnosis not present

## 2018-09-26 DIAGNOSIS — J9622 Acute and chronic respiratory failure with hypercapnia: Secondary | ICD-10-CM | POA: Diagnosis not present

## 2018-09-26 DIAGNOSIS — J9621 Acute and chronic respiratory failure with hypoxia: Secondary | ICD-10-CM | POA: Diagnosis not present

## 2018-09-29 DIAGNOSIS — Z87891 Personal history of nicotine dependence: Secondary | ICD-10-CM | POA: Diagnosis not present

## 2018-09-29 DIAGNOSIS — F329 Major depressive disorder, single episode, unspecified: Secondary | ICD-10-CM | POA: Diagnosis not present

## 2018-09-29 DIAGNOSIS — J9622 Acute and chronic respiratory failure with hypercapnia: Secondary | ICD-10-CM | POA: Diagnosis not present

## 2018-09-29 DIAGNOSIS — R350 Frequency of micturition: Secondary | ICD-10-CM | POA: Diagnosis not present

## 2018-09-29 DIAGNOSIS — J449 Chronic obstructive pulmonary disease, unspecified: Secondary | ICD-10-CM | POA: Diagnosis not present

## 2018-09-29 DIAGNOSIS — J9621 Acute and chronic respiratory failure with hypoxia: Secondary | ICD-10-CM | POA: Diagnosis not present

## 2018-09-29 DIAGNOSIS — J441 Chronic obstructive pulmonary disease with (acute) exacerbation: Secondary | ICD-10-CM | POA: Diagnosis not present

## 2018-09-29 DIAGNOSIS — Z6821 Body mass index (BMI) 21.0-21.9, adult: Secondary | ICD-10-CM | POA: Diagnosis not present

## 2018-09-29 DIAGNOSIS — I10 Essential (primary) hypertension: Secondary | ICD-10-CM | POA: Diagnosis not present

## 2018-09-29 DIAGNOSIS — Z4789 Encounter for other orthopedic aftercare: Secondary | ICD-10-CM | POA: Diagnosis not present

## 2018-09-29 DIAGNOSIS — Z299 Encounter for prophylactic measures, unspecified: Secondary | ICD-10-CM | POA: Diagnosis not present

## 2018-09-30 DIAGNOSIS — Z825 Family history of asthma and other chronic lower respiratory diseases: Secondary | ICD-10-CM | POA: Diagnosis not present

## 2018-09-30 DIAGNOSIS — Z79899 Other long term (current) drug therapy: Secondary | ICD-10-CM | POA: Diagnosis not present

## 2018-09-30 DIAGNOSIS — Z7951 Long term (current) use of inhaled steroids: Secondary | ICD-10-CM | POA: Diagnosis not present

## 2018-09-30 DIAGNOSIS — J441 Chronic obstructive pulmonary disease with (acute) exacerbation: Secondary | ICD-10-CM | POA: Diagnosis not present

## 2018-09-30 DIAGNOSIS — I10 Essential (primary) hypertension: Secondary | ICD-10-CM | POA: Diagnosis not present

## 2018-09-30 DIAGNOSIS — Z9981 Dependence on supplemental oxygen: Secondary | ICD-10-CM | POA: Diagnosis not present

## 2018-09-30 DIAGNOSIS — R0602 Shortness of breath: Secondary | ICD-10-CM | POA: Diagnosis not present

## 2018-09-30 DIAGNOSIS — M549 Dorsalgia, unspecified: Secondary | ICD-10-CM | POA: Diagnosis not present

## 2018-09-30 DIAGNOSIS — J209 Acute bronchitis, unspecified: Secondary | ICD-10-CM | POA: Diagnosis not present

## 2018-09-30 DIAGNOSIS — F329 Major depressive disorder, single episode, unspecified: Secondary | ICD-10-CM | POA: Diagnosis not present

## 2018-09-30 DIAGNOSIS — I252 Old myocardial infarction: Secondary | ICD-10-CM | POA: Diagnosis not present

## 2018-09-30 DIAGNOSIS — I2510 Atherosclerotic heart disease of native coronary artery without angina pectoris: Secondary | ICD-10-CM | POA: Diagnosis not present

## 2018-09-30 DIAGNOSIS — Z7952 Long term (current) use of systemic steroids: Secondary | ICD-10-CM | POA: Diagnosis not present

## 2018-09-30 DIAGNOSIS — K219 Gastro-esophageal reflux disease without esophagitis: Secondary | ICD-10-CM | POA: Diagnosis present

## 2018-09-30 DIAGNOSIS — M797 Fibromyalgia: Secondary | ICD-10-CM | POA: Diagnosis present

## 2018-09-30 DIAGNOSIS — J90 Pleural effusion, not elsewhere classified: Secondary | ICD-10-CM | POA: Diagnosis not present

## 2018-09-30 DIAGNOSIS — Z87891 Personal history of nicotine dependence: Secondary | ICD-10-CM | POA: Diagnosis not present

## 2018-09-30 DIAGNOSIS — G8929 Other chronic pain: Secondary | ICD-10-CM | POA: Diagnosis not present

## 2018-09-30 DIAGNOSIS — J440 Chronic obstructive pulmonary disease with acute lower respiratory infection: Secondary | ICD-10-CM | POA: Diagnosis not present

## 2018-09-30 DIAGNOSIS — R079 Chest pain, unspecified: Secondary | ICD-10-CM | POA: Diagnosis not present

## 2018-09-30 DIAGNOSIS — R000 Tachycardia, unspecified: Secondary | ICD-10-CM | POA: Diagnosis not present

## 2018-09-30 DIAGNOSIS — R0689 Other abnormalities of breathing: Secondary | ICD-10-CM | POA: Diagnosis not present

## 2018-10-05 DIAGNOSIS — Z4789 Encounter for other orthopedic aftercare: Secondary | ICD-10-CM | POA: Diagnosis not present

## 2018-10-05 DIAGNOSIS — J441 Chronic obstructive pulmonary disease with (acute) exacerbation: Secondary | ICD-10-CM | POA: Diagnosis not present

## 2018-10-05 DIAGNOSIS — I10 Essential (primary) hypertension: Secondary | ICD-10-CM | POA: Diagnosis not present

## 2018-10-05 DIAGNOSIS — J9622 Acute and chronic respiratory failure with hypercapnia: Secondary | ICD-10-CM | POA: Diagnosis not present

## 2018-10-05 DIAGNOSIS — F329 Major depressive disorder, single episode, unspecified: Secondary | ICD-10-CM | POA: Diagnosis not present

## 2018-10-05 DIAGNOSIS — J9621 Acute and chronic respiratory failure with hypoxia: Secondary | ICD-10-CM | POA: Diagnosis not present

## 2018-10-07 DIAGNOSIS — J441 Chronic obstructive pulmonary disease with (acute) exacerbation: Secondary | ICD-10-CM | POA: Diagnosis not present

## 2018-10-07 DIAGNOSIS — J9622 Acute and chronic respiratory failure with hypercapnia: Secondary | ICD-10-CM | POA: Diagnosis not present

## 2018-10-07 DIAGNOSIS — Z4789 Encounter for other orthopedic aftercare: Secondary | ICD-10-CM | POA: Diagnosis not present

## 2018-10-07 DIAGNOSIS — F329 Major depressive disorder, single episode, unspecified: Secondary | ICD-10-CM | POA: Diagnosis not present

## 2018-10-07 DIAGNOSIS — J9621 Acute and chronic respiratory failure with hypoxia: Secondary | ICD-10-CM | POA: Diagnosis not present

## 2018-10-07 DIAGNOSIS — I10 Essential (primary) hypertension: Secondary | ICD-10-CM | POA: Diagnosis not present

## 2018-10-10 DIAGNOSIS — J9621 Acute and chronic respiratory failure with hypoxia: Secondary | ICD-10-CM | POA: Diagnosis not present

## 2018-10-10 DIAGNOSIS — J441 Chronic obstructive pulmonary disease with (acute) exacerbation: Secondary | ICD-10-CM | POA: Diagnosis not present

## 2018-10-10 DIAGNOSIS — F329 Major depressive disorder, single episode, unspecified: Secondary | ICD-10-CM | POA: Diagnosis not present

## 2018-10-10 DIAGNOSIS — Z4789 Encounter for other orthopedic aftercare: Secondary | ICD-10-CM | POA: Diagnosis not present

## 2018-10-10 DIAGNOSIS — I10 Essential (primary) hypertension: Secondary | ICD-10-CM | POA: Diagnosis not present

## 2018-10-10 DIAGNOSIS — J9622 Acute and chronic respiratory failure with hypercapnia: Secondary | ICD-10-CM | POA: Diagnosis not present

## 2018-10-13 DIAGNOSIS — I10 Essential (primary) hypertension: Secondary | ICD-10-CM | POA: Diagnosis not present

## 2018-10-13 DIAGNOSIS — Z4789 Encounter for other orthopedic aftercare: Secondary | ICD-10-CM | POA: Diagnosis not present

## 2018-10-13 DIAGNOSIS — J9622 Acute and chronic respiratory failure with hypercapnia: Secondary | ICD-10-CM | POA: Diagnosis not present

## 2018-10-13 DIAGNOSIS — J441 Chronic obstructive pulmonary disease with (acute) exacerbation: Secondary | ICD-10-CM | POA: Diagnosis not present

## 2018-10-13 DIAGNOSIS — F329 Major depressive disorder, single episode, unspecified: Secondary | ICD-10-CM | POA: Diagnosis not present

## 2018-10-13 DIAGNOSIS — J9621 Acute and chronic respiratory failure with hypoxia: Secondary | ICD-10-CM | POA: Diagnosis not present

## 2018-10-14 DIAGNOSIS — M545 Low back pain: Secondary | ICD-10-CM | POA: Diagnosis not present

## 2018-10-14 DIAGNOSIS — Z299 Encounter for prophylactic measures, unspecified: Secondary | ICD-10-CM | POA: Diagnosis not present

## 2018-10-14 DIAGNOSIS — J449 Chronic obstructive pulmonary disease, unspecified: Secondary | ICD-10-CM | POA: Diagnosis not present

## 2018-10-14 DIAGNOSIS — I2510 Atherosclerotic heart disease of native coronary artery without angina pectoris: Secondary | ICD-10-CM | POA: Diagnosis not present

## 2018-10-14 DIAGNOSIS — I10 Essential (primary) hypertension: Secondary | ICD-10-CM | POA: Diagnosis not present

## 2018-10-14 DIAGNOSIS — M4856XG Collapsed vertebra, not elsewhere classified, lumbar region, subsequent encounter for fracture with delayed healing: Secondary | ICD-10-CM | POA: Diagnosis not present

## 2018-10-14 DIAGNOSIS — M4856XA Collapsed vertebra, not elsewhere classified, lumbar region, initial encounter for fracture: Secondary | ICD-10-CM | POA: Diagnosis not present

## 2018-10-14 DIAGNOSIS — Z87891 Personal history of nicotine dependence: Secondary | ICD-10-CM | POA: Diagnosis not present

## 2018-10-14 DIAGNOSIS — Z6820 Body mass index (BMI) 20.0-20.9, adult: Secondary | ICD-10-CM | POA: Diagnosis not present

## 2018-10-14 DIAGNOSIS — M4854XG Collapsed vertebra, not elsewhere classified, thoracic region, subsequent encounter for fracture with delayed healing: Secondary | ICD-10-CM | POA: Diagnosis not present

## 2018-10-15 DIAGNOSIS — J9622 Acute and chronic respiratory failure with hypercapnia: Secondary | ICD-10-CM | POA: Diagnosis not present

## 2018-10-15 DIAGNOSIS — J9621 Acute and chronic respiratory failure with hypoxia: Secondary | ICD-10-CM | POA: Diagnosis not present

## 2018-10-15 DIAGNOSIS — F329 Major depressive disorder, single episode, unspecified: Secondary | ICD-10-CM | POA: Diagnosis not present

## 2018-10-15 DIAGNOSIS — Z4789 Encounter for other orthopedic aftercare: Secondary | ICD-10-CM | POA: Diagnosis not present

## 2018-10-15 DIAGNOSIS — I10 Essential (primary) hypertension: Secondary | ICD-10-CM | POA: Diagnosis not present

## 2018-10-15 DIAGNOSIS — J441 Chronic obstructive pulmonary disease with (acute) exacerbation: Secondary | ICD-10-CM | POA: Diagnosis not present

## 2018-10-16 ENCOUNTER — Other Ambulatory Visit (HOSPITAL_COMMUNITY): Payer: Self-pay | Admitting: Psychiatry

## 2018-10-16 ENCOUNTER — Telehealth (HOSPITAL_COMMUNITY): Payer: Self-pay

## 2018-10-16 DIAGNOSIS — I10 Essential (primary) hypertension: Secondary | ICD-10-CM | POA: Diagnosis not present

## 2018-10-16 DIAGNOSIS — J441 Chronic obstructive pulmonary disease with (acute) exacerbation: Secondary | ICD-10-CM | POA: Diagnosis not present

## 2018-10-16 DIAGNOSIS — Z4789 Encounter for other orthopedic aftercare: Secondary | ICD-10-CM | POA: Diagnosis not present

## 2018-10-16 DIAGNOSIS — J9622 Acute and chronic respiratory failure with hypercapnia: Secondary | ICD-10-CM | POA: Diagnosis not present

## 2018-10-16 DIAGNOSIS — F329 Major depressive disorder, single episode, unspecified: Secondary | ICD-10-CM | POA: Diagnosis not present

## 2018-10-16 DIAGNOSIS — J9621 Acute and chronic respiratory failure with hypoxia: Secondary | ICD-10-CM | POA: Diagnosis not present

## 2018-10-16 MED ORDER — ALPRAZOLAM 1 MG PO TABS: 1.0000 mg | ORAL_TABLET | Freq: Three times a day (TID) | ORAL | 0 refills | Status: DC

## 2018-10-16 NOTE — Telephone Encounter (Signed)
Called and left voicemail message.

## 2018-10-16 NOTE — Telephone Encounter (Signed)
sent 

## 2018-10-16 NOTE — Telephone Encounter (Signed)
This patient called requesting a refill on Xanax 1mg . A refill was sent into [facility], but the patient no longer uses them and she said that she will be unable to get that refill.  Patient requested that a refill be sent to [facility]

## 2018-10-17 DIAGNOSIS — Z4789 Encounter for other orthopedic aftercare: Secondary | ICD-10-CM | POA: Diagnosis not present

## 2018-10-17 DIAGNOSIS — J9621 Acute and chronic respiratory failure with hypoxia: Secondary | ICD-10-CM | POA: Diagnosis not present

## 2018-10-17 DIAGNOSIS — F329 Major depressive disorder, single episode, unspecified: Secondary | ICD-10-CM | POA: Diagnosis not present

## 2018-10-17 DIAGNOSIS — I10 Essential (primary) hypertension: Secondary | ICD-10-CM | POA: Diagnosis not present

## 2018-10-17 DIAGNOSIS — J441 Chronic obstructive pulmonary disease with (acute) exacerbation: Secondary | ICD-10-CM | POA: Diagnosis not present

## 2018-10-17 DIAGNOSIS — J9622 Acute and chronic respiratory failure with hypercapnia: Secondary | ICD-10-CM | POA: Diagnosis not present

## 2018-10-20 DIAGNOSIS — Z4789 Encounter for other orthopedic aftercare: Secondary | ICD-10-CM | POA: Diagnosis not present

## 2018-10-20 DIAGNOSIS — J441 Chronic obstructive pulmonary disease with (acute) exacerbation: Secondary | ICD-10-CM | POA: Diagnosis not present

## 2018-10-20 DIAGNOSIS — F329 Major depressive disorder, single episode, unspecified: Secondary | ICD-10-CM | POA: Diagnosis not present

## 2018-10-20 DIAGNOSIS — I10 Essential (primary) hypertension: Secondary | ICD-10-CM | POA: Diagnosis not present

## 2018-10-20 DIAGNOSIS — J9621 Acute and chronic respiratory failure with hypoxia: Secondary | ICD-10-CM | POA: Diagnosis not present

## 2018-10-20 DIAGNOSIS — J9622 Acute and chronic respiratory failure with hypercapnia: Secondary | ICD-10-CM | POA: Diagnosis not present

## 2018-10-22 DIAGNOSIS — J9622 Acute and chronic respiratory failure with hypercapnia: Secondary | ICD-10-CM | POA: Diagnosis not present

## 2018-10-22 DIAGNOSIS — Z4789 Encounter for other orthopedic aftercare: Secondary | ICD-10-CM | POA: Diagnosis not present

## 2018-10-22 DIAGNOSIS — I10 Essential (primary) hypertension: Secondary | ICD-10-CM | POA: Diagnosis not present

## 2018-10-22 DIAGNOSIS — J9621 Acute and chronic respiratory failure with hypoxia: Secondary | ICD-10-CM | POA: Diagnosis not present

## 2018-10-22 DIAGNOSIS — J441 Chronic obstructive pulmonary disease with (acute) exacerbation: Secondary | ICD-10-CM | POA: Diagnosis not present

## 2018-10-22 DIAGNOSIS — F329 Major depressive disorder, single episode, unspecified: Secondary | ICD-10-CM | POA: Diagnosis not present

## 2018-10-23 DIAGNOSIS — J9621 Acute and chronic respiratory failure with hypoxia: Secondary | ICD-10-CM | POA: Diagnosis not present

## 2018-10-23 DIAGNOSIS — J441 Chronic obstructive pulmonary disease with (acute) exacerbation: Secondary | ICD-10-CM | POA: Diagnosis not present

## 2018-10-23 DIAGNOSIS — J9622 Acute and chronic respiratory failure with hypercapnia: Secondary | ICD-10-CM | POA: Diagnosis not present

## 2018-10-23 DIAGNOSIS — F329 Major depressive disorder, single episode, unspecified: Secondary | ICD-10-CM | POA: Diagnosis not present

## 2018-10-23 DIAGNOSIS — Z4789 Encounter for other orthopedic aftercare: Secondary | ICD-10-CM | POA: Diagnosis not present

## 2018-10-23 DIAGNOSIS — I10 Essential (primary) hypertension: Secondary | ICD-10-CM | POA: Diagnosis not present

## 2018-10-27 DIAGNOSIS — J9621 Acute and chronic respiratory failure with hypoxia: Secondary | ICD-10-CM | POA: Diagnosis not present

## 2018-10-27 DIAGNOSIS — I10 Essential (primary) hypertension: Secondary | ICD-10-CM | POA: Diagnosis not present

## 2018-10-27 DIAGNOSIS — J441 Chronic obstructive pulmonary disease with (acute) exacerbation: Secondary | ICD-10-CM | POA: Diagnosis not present

## 2018-10-27 DIAGNOSIS — F329 Major depressive disorder, single episode, unspecified: Secondary | ICD-10-CM | POA: Diagnosis not present

## 2018-10-27 DIAGNOSIS — Z4789 Encounter for other orthopedic aftercare: Secondary | ICD-10-CM | POA: Diagnosis not present

## 2018-10-27 DIAGNOSIS — J9622 Acute and chronic respiratory failure with hypercapnia: Secondary | ICD-10-CM | POA: Diagnosis not present

## 2018-10-29 DIAGNOSIS — I10 Essential (primary) hypertension: Secondary | ICD-10-CM | POA: Diagnosis not present

## 2018-10-29 DIAGNOSIS — J9622 Acute and chronic respiratory failure with hypercapnia: Secondary | ICD-10-CM | POA: Diagnosis not present

## 2018-10-29 DIAGNOSIS — J441 Chronic obstructive pulmonary disease with (acute) exacerbation: Secondary | ICD-10-CM | POA: Diagnosis not present

## 2018-10-29 DIAGNOSIS — F329 Major depressive disorder, single episode, unspecified: Secondary | ICD-10-CM | POA: Diagnosis not present

## 2018-10-29 DIAGNOSIS — J9621 Acute and chronic respiratory failure with hypoxia: Secondary | ICD-10-CM | POA: Diagnosis not present

## 2018-10-29 DIAGNOSIS — Z4789 Encounter for other orthopedic aftercare: Secondary | ICD-10-CM | POA: Diagnosis not present

## 2018-10-30 DIAGNOSIS — F329 Major depressive disorder, single episode, unspecified: Secondary | ICD-10-CM | POA: Diagnosis not present

## 2018-10-30 DIAGNOSIS — I10 Essential (primary) hypertension: Secondary | ICD-10-CM | POA: Diagnosis not present

## 2018-10-30 DIAGNOSIS — J441 Chronic obstructive pulmonary disease with (acute) exacerbation: Secondary | ICD-10-CM | POA: Diagnosis not present

## 2018-10-30 DIAGNOSIS — J9622 Acute and chronic respiratory failure with hypercapnia: Secondary | ICD-10-CM | POA: Diagnosis not present

## 2018-10-30 DIAGNOSIS — J9621 Acute and chronic respiratory failure with hypoxia: Secondary | ICD-10-CM | POA: Diagnosis not present

## 2018-10-30 DIAGNOSIS — Z4789 Encounter for other orthopedic aftercare: Secondary | ICD-10-CM | POA: Diagnosis not present

## 2018-10-31 DIAGNOSIS — Z4789 Encounter for other orthopedic aftercare: Secondary | ICD-10-CM | POA: Diagnosis not present

## 2018-10-31 DIAGNOSIS — F329 Major depressive disorder, single episode, unspecified: Secondary | ICD-10-CM | POA: Diagnosis not present

## 2018-10-31 DIAGNOSIS — J9622 Acute and chronic respiratory failure with hypercapnia: Secondary | ICD-10-CM | POA: Diagnosis not present

## 2018-10-31 DIAGNOSIS — J441 Chronic obstructive pulmonary disease with (acute) exacerbation: Secondary | ICD-10-CM | POA: Diagnosis not present

## 2018-10-31 DIAGNOSIS — J9621 Acute and chronic respiratory failure with hypoxia: Secondary | ICD-10-CM | POA: Diagnosis not present

## 2018-10-31 DIAGNOSIS — I10 Essential (primary) hypertension: Secondary | ICD-10-CM | POA: Diagnosis not present

## 2018-11-03 DIAGNOSIS — N952 Postmenopausal atrophic vaginitis: Secondary | ICD-10-CM | POA: Diagnosis not present

## 2018-11-03 DIAGNOSIS — F329 Major depressive disorder, single episode, unspecified: Secondary | ICD-10-CM | POA: Diagnosis not present

## 2018-11-03 DIAGNOSIS — J441 Chronic obstructive pulmonary disease with (acute) exacerbation: Secondary | ICD-10-CM | POA: Diagnosis not present

## 2018-11-03 DIAGNOSIS — Z1231 Encounter for screening mammogram for malignant neoplasm of breast: Secondary | ICD-10-CM | POA: Diagnosis not present

## 2018-11-03 DIAGNOSIS — R87623 High grade squamous intraepithelial lesion on cytologic smear of vagina (HGSIL): Secondary | ICD-10-CM | POA: Diagnosis not present

## 2018-11-03 DIAGNOSIS — J9622 Acute and chronic respiratory failure with hypercapnia: Secondary | ICD-10-CM | POA: Diagnosis not present

## 2018-11-03 DIAGNOSIS — I10 Essential (primary) hypertension: Secondary | ICD-10-CM | POA: Diagnosis not present

## 2018-11-03 DIAGNOSIS — Z1272 Encounter for screening for malignant neoplasm of vagina: Secondary | ICD-10-CM | POA: Diagnosis not present

## 2018-11-03 DIAGNOSIS — Z90712 Acquired absence of cervix with remaining uterus: Secondary | ICD-10-CM | POA: Diagnosis not present

## 2018-11-03 DIAGNOSIS — J9621 Acute and chronic respiratory failure with hypoxia: Secondary | ICD-10-CM | POA: Diagnosis not present

## 2018-11-03 DIAGNOSIS — N871 Moderate cervical dysplasia: Secondary | ICD-10-CM | POA: Diagnosis not present

## 2018-11-03 DIAGNOSIS — Z4789 Encounter for other orthopedic aftercare: Secondary | ICD-10-CM | POA: Diagnosis not present

## 2018-11-04 DIAGNOSIS — J9622 Acute and chronic respiratory failure with hypercapnia: Secondary | ICD-10-CM | POA: Diagnosis not present

## 2018-11-04 DIAGNOSIS — I10 Essential (primary) hypertension: Secondary | ICD-10-CM | POA: Diagnosis not present

## 2018-11-04 DIAGNOSIS — J9621 Acute and chronic respiratory failure with hypoxia: Secondary | ICD-10-CM | POA: Diagnosis not present

## 2018-11-04 DIAGNOSIS — Z4789 Encounter for other orthopedic aftercare: Secondary | ICD-10-CM | POA: Diagnosis not present

## 2018-11-04 DIAGNOSIS — F329 Major depressive disorder, single episode, unspecified: Secondary | ICD-10-CM | POA: Diagnosis not present

## 2018-11-04 DIAGNOSIS — J441 Chronic obstructive pulmonary disease with (acute) exacerbation: Secondary | ICD-10-CM | POA: Diagnosis not present

## 2018-11-05 DIAGNOSIS — J9622 Acute and chronic respiratory failure with hypercapnia: Secondary | ICD-10-CM | POA: Diagnosis not present

## 2018-11-05 DIAGNOSIS — J441 Chronic obstructive pulmonary disease with (acute) exacerbation: Secondary | ICD-10-CM | POA: Diagnosis not present

## 2018-11-05 DIAGNOSIS — Z4789 Encounter for other orthopedic aftercare: Secondary | ICD-10-CM | POA: Diagnosis not present

## 2018-11-05 DIAGNOSIS — F329 Major depressive disorder, single episode, unspecified: Secondary | ICD-10-CM | POA: Diagnosis not present

## 2018-11-05 DIAGNOSIS — J9621 Acute and chronic respiratory failure with hypoxia: Secondary | ICD-10-CM | POA: Diagnosis not present

## 2018-11-05 DIAGNOSIS — I10 Essential (primary) hypertension: Secondary | ICD-10-CM | POA: Diagnosis not present

## 2018-11-06 DIAGNOSIS — Z87891 Personal history of nicotine dependence: Secondary | ICD-10-CM | POA: Diagnosis not present

## 2018-11-06 DIAGNOSIS — N952 Postmenopausal atrophic vaginitis: Secondary | ICD-10-CM | POA: Diagnosis present

## 2018-11-06 DIAGNOSIS — F419 Anxiety disorder, unspecified: Secondary | ICD-10-CM | POA: Diagnosis present

## 2018-11-06 DIAGNOSIS — R87629 Unspecified abnormal cytological findings in specimens from vagina: Secondary | ICD-10-CM | POA: Diagnosis present

## 2018-11-06 DIAGNOSIS — Z791 Long term (current) use of non-steroidal anti-inflammatories (NSAID): Secondary | ICD-10-CM | POA: Diagnosis not present

## 2018-11-06 DIAGNOSIS — Z79891 Long term (current) use of opiate analgesic: Secondary | ICD-10-CM | POA: Diagnosis not present

## 2018-11-06 DIAGNOSIS — Z9981 Dependence on supplemental oxygen: Secondary | ICD-10-CM | POA: Diagnosis not present

## 2018-11-06 DIAGNOSIS — F329 Major depressive disorder, single episode, unspecified: Secondary | ICD-10-CM | POA: Diagnosis not present

## 2018-11-06 DIAGNOSIS — D649 Anemia, unspecified: Secondary | ICD-10-CM | POA: Diagnosis present

## 2018-11-06 DIAGNOSIS — I252 Old myocardial infarction: Secondary | ICD-10-CM | POA: Diagnosis not present

## 2018-11-06 DIAGNOSIS — I10 Essential (primary) hypertension: Secondary | ICD-10-CM | POA: Diagnosis not present

## 2018-11-06 DIAGNOSIS — J9622 Acute and chronic respiratory failure with hypercapnia: Secondary | ICD-10-CM | POA: Diagnosis not present

## 2018-11-06 DIAGNOSIS — M797 Fibromyalgia: Secondary | ICD-10-CM | POA: Diagnosis not present

## 2018-11-06 DIAGNOSIS — E875 Hyperkalemia: Secondary | ICD-10-CM | POA: Diagnosis not present

## 2018-11-06 DIAGNOSIS — I2510 Atherosclerotic heart disease of native coronary artery without angina pectoris: Secondary | ICD-10-CM | POA: Diagnosis present

## 2018-11-06 DIAGNOSIS — K589 Irritable bowel syndrome without diarrhea: Secondary | ICD-10-CM | POA: Diagnosis present

## 2018-11-06 DIAGNOSIS — Z7952 Long term (current) use of systemic steroids: Secondary | ICD-10-CM | POA: Diagnosis not present

## 2018-11-06 DIAGNOSIS — J441 Chronic obstructive pulmonary disease with (acute) exacerbation: Secondary | ICD-10-CM | POA: Diagnosis not present

## 2018-11-06 DIAGNOSIS — J9611 Chronic respiratory failure with hypoxia: Secondary | ICD-10-CM | POA: Diagnosis not present

## 2018-11-06 DIAGNOSIS — J449 Chronic obstructive pulmonary disease, unspecified: Secondary | ICD-10-CM | POA: Diagnosis not present

## 2018-11-06 DIAGNOSIS — E872 Acidosis: Secondary | ICD-10-CM | POA: Diagnosis not present

## 2018-11-06 DIAGNOSIS — J9621 Acute and chronic respiratory failure with hypoxia: Secondary | ICD-10-CM | POA: Diagnosis not present

## 2018-11-06 DIAGNOSIS — Z9181 History of falling: Secondary | ICD-10-CM | POA: Diagnosis not present

## 2018-11-06 DIAGNOSIS — E785 Hyperlipidemia, unspecified: Secondary | ICD-10-CM | POA: Diagnosis present

## 2018-11-06 DIAGNOSIS — Z888 Allergy status to other drugs, medicaments and biological substances status: Secondary | ICD-10-CM | POA: Diagnosis not present

## 2018-11-06 DIAGNOSIS — J9612 Chronic respiratory failure with hypercapnia: Secondary | ICD-10-CM | POA: Diagnosis not present

## 2018-11-06 DIAGNOSIS — K219 Gastro-esophageal reflux disease without esophagitis: Secondary | ICD-10-CM | POA: Diagnosis not present

## 2018-11-11 DIAGNOSIS — G894 Chronic pain syndrome: Secondary | ICD-10-CM | POA: Diagnosis not present

## 2018-11-11 DIAGNOSIS — M4854XG Collapsed vertebra, not elsewhere classified, thoracic region, subsequent encounter for fracture with delayed healing: Secondary | ICD-10-CM | POA: Diagnosis not present

## 2018-11-11 DIAGNOSIS — M545 Low back pain: Secondary | ICD-10-CM | POA: Diagnosis not present

## 2018-11-11 DIAGNOSIS — G8929 Other chronic pain: Secondary | ICD-10-CM | POA: Diagnosis not present

## 2018-11-12 DIAGNOSIS — J9611 Chronic respiratory failure with hypoxia: Secondary | ICD-10-CM | POA: Diagnosis not present

## 2018-11-12 DIAGNOSIS — I10 Essential (primary) hypertension: Secondary | ICD-10-CM | POA: Diagnosis not present

## 2018-11-12 DIAGNOSIS — F329 Major depressive disorder, single episode, unspecified: Secondary | ICD-10-CM | POA: Diagnosis not present

## 2018-11-12 DIAGNOSIS — I2510 Atherosclerotic heart disease of native coronary artery without angina pectoris: Secondary | ICD-10-CM | POA: Diagnosis not present

## 2018-11-12 DIAGNOSIS — J9612 Chronic respiratory failure with hypercapnia: Secondary | ICD-10-CM | POA: Diagnosis not present

## 2018-11-12 DIAGNOSIS — J449 Chronic obstructive pulmonary disease, unspecified: Secondary | ICD-10-CM | POA: Diagnosis not present

## 2018-11-14 DIAGNOSIS — J9611 Chronic respiratory failure with hypoxia: Secondary | ICD-10-CM | POA: Diagnosis not present

## 2018-11-14 DIAGNOSIS — I2510 Atherosclerotic heart disease of native coronary artery without angina pectoris: Secondary | ICD-10-CM | POA: Diagnosis not present

## 2018-11-14 DIAGNOSIS — J449 Chronic obstructive pulmonary disease, unspecified: Secondary | ICD-10-CM | POA: Diagnosis not present

## 2018-11-14 DIAGNOSIS — F329 Major depressive disorder, single episode, unspecified: Secondary | ICD-10-CM | POA: Diagnosis not present

## 2018-11-14 DIAGNOSIS — I10 Essential (primary) hypertension: Secondary | ICD-10-CM | POA: Diagnosis not present

## 2018-11-14 DIAGNOSIS — J9612 Chronic respiratory failure with hypercapnia: Secondary | ICD-10-CM | POA: Diagnosis not present

## 2018-11-17 DIAGNOSIS — I2510 Atherosclerotic heart disease of native coronary artery without angina pectoris: Secondary | ICD-10-CM | POA: Diagnosis not present

## 2018-11-17 DIAGNOSIS — J9612 Chronic respiratory failure with hypercapnia: Secondary | ICD-10-CM | POA: Diagnosis not present

## 2018-11-17 DIAGNOSIS — J9611 Chronic respiratory failure with hypoxia: Secondary | ICD-10-CM | POA: Diagnosis not present

## 2018-11-17 DIAGNOSIS — I10 Essential (primary) hypertension: Secondary | ICD-10-CM | POA: Diagnosis not present

## 2018-11-17 DIAGNOSIS — F329 Major depressive disorder, single episode, unspecified: Secondary | ICD-10-CM | POA: Diagnosis not present

## 2018-11-17 DIAGNOSIS — J449 Chronic obstructive pulmonary disease, unspecified: Secondary | ICD-10-CM | POA: Diagnosis not present

## 2018-11-18 DIAGNOSIS — I10 Essential (primary) hypertension: Secondary | ICD-10-CM | POA: Diagnosis not present

## 2018-11-18 DIAGNOSIS — J9611 Chronic respiratory failure with hypoxia: Secondary | ICD-10-CM | POA: Diagnosis not present

## 2018-11-18 DIAGNOSIS — J9612 Chronic respiratory failure with hypercapnia: Secondary | ICD-10-CM | POA: Diagnosis not present

## 2018-11-18 DIAGNOSIS — Z87891 Personal history of nicotine dependence: Secondary | ICD-10-CM | POA: Diagnosis not present

## 2018-11-18 DIAGNOSIS — J449 Chronic obstructive pulmonary disease, unspecified: Secondary | ICD-10-CM | POA: Diagnosis not present

## 2018-11-18 DIAGNOSIS — I2510 Atherosclerotic heart disease of native coronary artery without angina pectoris: Secondary | ICD-10-CM | POA: Diagnosis not present

## 2018-11-18 DIAGNOSIS — Z299 Encounter for prophylactic measures, unspecified: Secondary | ICD-10-CM | POA: Diagnosis not present

## 2018-11-18 DIAGNOSIS — Z6820 Body mass index (BMI) 20.0-20.9, adult: Secondary | ICD-10-CM | POA: Diagnosis not present

## 2018-11-18 DIAGNOSIS — F329 Major depressive disorder, single episode, unspecified: Secondary | ICD-10-CM | POA: Diagnosis not present

## 2018-11-20 ENCOUNTER — Encounter (HOSPITAL_COMMUNITY): Payer: Self-pay | Admitting: Psychiatry

## 2018-11-20 ENCOUNTER — Ambulatory Visit (INDEPENDENT_AMBULATORY_CARE_PROVIDER_SITE_OTHER): Payer: Medicare Other | Admitting: Psychiatry

## 2018-11-20 ENCOUNTER — Other Ambulatory Visit (HOSPITAL_COMMUNITY): Payer: Self-pay | Admitting: Psychiatry

## 2018-11-20 VITALS — BP 164/100 | HR 117 | Ht 62.0 in | Wt 107.0 lb

## 2018-11-20 DIAGNOSIS — F322 Major depressive disorder, single episode, severe without psychotic features: Secondary | ICD-10-CM

## 2018-11-20 MED ORDER — VILAZODONE HCL 40 MG PO TABS: 40.0000 mg | ORAL_TABLET | Freq: Every day | ORAL | 2 refills | Status: DC

## 2018-11-20 MED ORDER — ALPRAZOLAM 1 MG PO TABS: 1.0000 mg | ORAL_TABLET | Freq: Three times a day (TID) | ORAL | 2 refills | Status: DC

## 2018-11-20 MED ORDER — MIRTAZAPINE 15 MG PO TABS: 15.0000 mg | ORAL_TABLET | Freq: Every day | ORAL | 2 refills | Status: DC
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
## 2018-11-21 DIAGNOSIS — I2510 Atherosclerotic heart disease of native coronary artery without angina pectoris: Secondary | ICD-10-CM | POA: Diagnosis not present

## 2018-11-21 DIAGNOSIS — I10 Essential (primary) hypertension: Secondary | ICD-10-CM | POA: Diagnosis not present

## 2018-11-21 DIAGNOSIS — J9611 Chronic respiratory failure with hypoxia: Secondary | ICD-10-CM | POA: Diagnosis not present

## 2018-11-21 DIAGNOSIS — J9612 Chronic respiratory failure with hypercapnia: Secondary | ICD-10-CM | POA: Diagnosis not present

## 2018-11-21 DIAGNOSIS — F329 Major depressive disorder, single episode, unspecified: Secondary | ICD-10-CM | POA: Diagnosis not present

## 2018-11-21 DIAGNOSIS — J449 Chronic obstructive pulmonary disease, unspecified: Secondary | ICD-10-CM | POA: Diagnosis not present

## 2018-11-24 ENCOUNTER — Telehealth (HOSPITAL_COMMUNITY): Payer: Self-pay | Admitting: *Deleted

## 2018-11-24 DIAGNOSIS — J9611 Chronic respiratory failure with hypoxia: Secondary | ICD-10-CM | POA: Diagnosis not present

## 2018-11-24 DIAGNOSIS — I10 Essential (primary) hypertension: Secondary | ICD-10-CM | POA: Diagnosis not present

## 2018-11-24 DIAGNOSIS — F329 Major depressive disorder, single episode, unspecified: Secondary | ICD-10-CM | POA: Diagnosis not present

## 2018-11-24 DIAGNOSIS — I2510 Atherosclerotic heart disease of native coronary artery without angina pectoris: Secondary | ICD-10-CM | POA: Diagnosis not present

## 2018-11-24 DIAGNOSIS — J9612 Chronic respiratory failure with hypercapnia: Secondary | ICD-10-CM | POA: Diagnosis not present

## 2018-11-24 DIAGNOSIS — J449 Chronic obstructive pulmonary disease, unspecified: Secondary | ICD-10-CM | POA: Diagnosis not present

## 2018-11-24 NOTE — Telephone Encounter (Signed)
[employer] APPROVED ViiBYRD 40 MG TABLETS  COVERAGE STARTS : 11/24/2018  COVERAGE ENDS: 10/08/2019

## 2018-11-26 DIAGNOSIS — I10 Essential (primary) hypertension: Secondary | ICD-10-CM | POA: Diagnosis not present

## 2018-11-26 DIAGNOSIS — I2510 Atherosclerotic heart disease of native coronary artery without angina pectoris: Secondary | ICD-10-CM | POA: Diagnosis not present

## 2018-11-26 DIAGNOSIS — F329 Major depressive disorder, single episode, unspecified: Secondary | ICD-10-CM | POA: Diagnosis not present

## 2018-11-26 DIAGNOSIS — J449 Chronic obstructive pulmonary disease, unspecified: Secondary | ICD-10-CM | POA: Diagnosis not present

## 2018-11-26 DIAGNOSIS — J9612 Chronic respiratory failure with hypercapnia: Secondary | ICD-10-CM | POA: Diagnosis not present

## 2018-11-26 DIAGNOSIS — J9611 Chronic respiratory failure with hypoxia: Secondary | ICD-10-CM | POA: Diagnosis not present

## 2018-11-27 DIAGNOSIS — Z9981 Dependence on supplemental oxygen: Secondary | ICD-10-CM | POA: Diagnosis not present

## 2018-11-27 DIAGNOSIS — N390 Urinary tract infection, site not specified: Secondary | ICD-10-CM | POA: Diagnosis not present

## 2018-11-27 DIAGNOSIS — J449 Chronic obstructive pulmonary disease, unspecified: Secondary | ICD-10-CM | POA: Diagnosis not present

## 2018-11-27 DIAGNOSIS — R87810 Cervical high risk human papillomavirus (HPV) DNA test positive: Secondary | ICD-10-CM | POA: Diagnosis not present

## 2018-11-27 DIAGNOSIS — N893 Dysplasia of vagina, unspecified: Secondary | ICD-10-CM | POA: Diagnosis not present

## 2018-12-01 ENCOUNTER — Telehealth (HOSPITAL_COMMUNITY): Payer: Self-pay | Admitting: *Deleted

## 2018-12-01 DIAGNOSIS — I2510 Atherosclerotic heart disease of native coronary artery without angina pectoris: Secondary | ICD-10-CM | POA: Diagnosis not present

## 2018-12-01 DIAGNOSIS — J9611 Chronic respiratory failure with hypoxia: Secondary | ICD-10-CM | POA: Diagnosis not present

## 2018-12-01 DIAGNOSIS — F329 Major depressive disorder, single episode, unspecified: Secondary | ICD-10-CM | POA: Diagnosis not present

## 2018-12-01 DIAGNOSIS — J9612 Chronic respiratory failure with hypercapnia: Secondary | ICD-10-CM | POA: Diagnosis not present

## 2018-12-01 DIAGNOSIS — I10 Essential (primary) hypertension: Secondary | ICD-10-CM | POA: Diagnosis not present

## 2018-12-01 DIAGNOSIS — J449 Chronic obstructive pulmonary disease, unspecified: Secondary | ICD-10-CM | POA: Diagnosis not present

## 2018-12-01 NOTE — Telephone Encounter (Signed)
Dr Vanetta Shawl Rx is there & waiting for pick-up. Patient notified

## 2018-12-01 NOTE — Telephone Encounter (Signed)
I believe there is an order from DR. Ross in February with refills at [facility]. Please verify it with the pharmacy.

## 2018-12-01 NOTE — Telephone Encounter (Signed)
Dr Vanetta Shawl Dr Tenny Craw patient next visit  01/20/2019 called requesting refill on Viibryd

## 2018-12-03 DIAGNOSIS — J449 Chronic obstructive pulmonary disease, unspecified: Secondary | ICD-10-CM | POA: Diagnosis not present

## 2018-12-03 DIAGNOSIS — I2510 Atherosclerotic heart disease of native coronary artery without angina pectoris: Secondary | ICD-10-CM | POA: Diagnosis not present

## 2018-12-03 DIAGNOSIS — I10 Essential (primary) hypertension: Secondary | ICD-10-CM | POA: Diagnosis not present

## 2018-12-03 DIAGNOSIS — F329 Major depressive disorder, single episode, unspecified: Secondary | ICD-10-CM | POA: Diagnosis not present

## 2018-12-03 DIAGNOSIS — J9611 Chronic respiratory failure with hypoxia: Secondary | ICD-10-CM | POA: Diagnosis not present

## 2018-12-03 DIAGNOSIS — J9612 Chronic respiratory failure with hypercapnia: Secondary | ICD-10-CM | POA: Diagnosis not present

## 2018-12-08 DIAGNOSIS — I10 Essential (primary) hypertension: Secondary | ICD-10-CM | POA: Diagnosis not present

## 2018-12-08 DIAGNOSIS — F329 Major depressive disorder, single episode, unspecified: Secondary | ICD-10-CM | POA: Diagnosis not present

## 2018-12-08 DIAGNOSIS — M797 Fibromyalgia: Secondary | ICD-10-CM | POA: Diagnosis not present

## 2018-12-08 DIAGNOSIS — Z9981 Dependence on supplemental oxygen: Secondary | ICD-10-CM | POA: Diagnosis not present

## 2018-12-08 DIAGNOSIS — I2510 Atherosclerotic heart disease of native coronary artery without angina pectoris: Secondary | ICD-10-CM | POA: Diagnosis not present

## 2018-12-08 DIAGNOSIS — J449 Chronic obstructive pulmonary disease, unspecified: Secondary | ICD-10-CM | POA: Diagnosis not present

## 2018-12-08 DIAGNOSIS — Z79891 Long term (current) use of opiate analgesic: Secondary | ICD-10-CM | POA: Diagnosis not present

## 2018-12-08 DIAGNOSIS — Z7952 Long term (current) use of systemic steroids: Secondary | ICD-10-CM | POA: Diagnosis not present

## 2018-12-08 DIAGNOSIS — J9612 Chronic respiratory failure with hypercapnia: Secondary | ICD-10-CM | POA: Diagnosis not present

## 2018-12-08 DIAGNOSIS — Z87891 Personal history of nicotine dependence: Secondary | ICD-10-CM | POA: Diagnosis not present

## 2018-12-08 DIAGNOSIS — F419 Anxiety disorder, unspecified: Secondary | ICD-10-CM | POA: Diagnosis not present

## 2018-12-08 DIAGNOSIS — Z9181 History of falling: Secondary | ICD-10-CM | POA: Diagnosis not present

## 2018-12-08 DIAGNOSIS — Z791 Long term (current) use of non-steroidal anti-inflammatories (NSAID): Secondary | ICD-10-CM | POA: Diagnosis not present

## 2018-12-08 DIAGNOSIS — J9611 Chronic respiratory failure with hypoxia: Secondary | ICD-10-CM | POA: Diagnosis not present

## 2018-12-12 ENCOUNTER — Ambulatory Visit (INDEPENDENT_AMBULATORY_CARE_PROVIDER_SITE_OTHER): Payer: Medicare Other | Admitting: Cardiovascular Disease

## 2018-12-12 ENCOUNTER — Encounter: Payer: Self-pay | Admitting: Cardiovascular Disease

## 2018-12-12 VITALS — BP 129/76 | HR 99 | Ht 61.0 in | Wt 108.6 lb

## 2018-12-12 DIAGNOSIS — J449 Chronic obstructive pulmonary disease, unspecified: Secondary | ICD-10-CM | POA: Diagnosis not present

## 2018-12-12 DIAGNOSIS — R000 Tachycardia, unspecified: Secondary | ICD-10-CM | POA: Diagnosis not present

## 2018-12-12 DIAGNOSIS — I5181 Takotsubo syndrome: Secondary | ICD-10-CM

## 2018-12-12 DIAGNOSIS — I10 Essential (primary) hypertension: Secondary | ICD-10-CM

## 2018-12-12 NOTE — Progress Notes (Signed)
SUBJECTIVE: The patient presents for routine follow-up.  She was hospitalized for a COPD exacerbation in December 2019.  Echocardiogram 08/30/2018 showed normal left ventricular systolic function and regional wall motion, LVEF 55 to 60%, and grade 1 diastolic dysfunction.  She has a history of tachycardia and malignant hypertension as well as Takotsubo cardiomyopathy/non-STEMI and has normal coronary arteries.  She uses 3 L of oxygen around-the-clock.  She denies chest pain.  Chronic exertional dyspnea is stable.  She occasionally has palpitations.  She denies leg swelling.  She also denies orthopnea and PND.    Review of Systems: As per "subjective", otherwise negative.  Allergies  Allergen Reactions  . Dicyclomine Nausea Only  . Doxycycline Nausea Only  . Trintellix [Vortioxetine] Nausea Only and Other (See Comments)    constipation    Current Outpatient Medications  Medication Sig Dispense Refill  . albuterol (PROVENTIL HFA;VENTOLIN HFA) 108 (90 BASE) MCG/ACT inhaler Inhale 2 puffs into the lungs every 6 (six) hours as needed for wheezing or shortness of breath.    Marland Kitchen albuterol (PROVENTIL) (2.5 MG/3ML) 0.083% nebulizer solution Take 2.5 mg by nebulization every 6 (six) hours as needed for wheezing or shortness of breath.    . ALPRAZolam (XANAX) 1 MG tablet Take 1 tablet (1 mg total) by mouth 3 (three) times daily. 90 tablet 2  . amLODipine (NORVASC) 5 MG tablet TAKE 1 TABLET DAILY (Patient taking differently: Take 5 mg by mouth daily. ) 90 tablet 2  . baclofen (LIORESAL) 10 MG tablet Take 10 mg by mouth 2 (two) times daily as needed for muscle spasms.    . budesonide-formoterol (SYMBICORT) 160-4.5 MCG/ACT inhaler Inhale 2 puffs into the lungs 2 (two) times daily.    . cyanocobalamin (,VITAMIN B-12,) 1000 MCG/ML injection Inject 1,000 mcg into the muscle every 14 (fourteen) days.     Marland Kitchen denosumab (PROLIA) 60 MG/ML SOLN injection Inject 60 mg into the skin every 6 (six)  months. Administer in upper arm, thigh, or abdomen    . dexlansoprazole (DEXILANT) 60 MG capsule Take 60 mg by mouth daily.    . fluticasone (FLONASE) 50 MCG/ACT nasal spray Place 1 spray into both nostrils daily.    Marland Kitchen gabapentin (NEURONTIN) 600 MG tablet Take 600 mg by mouth 3 (three) times daily.    Marland Kitchen HYDROcodone-acetaminophen (NORCO/VICODIN) 5-325 MG tablet Take 1 tablet by mouth every 4 (four) hours as needed for moderate pain.    . IRON PO Take by mouth daily.    Marland Kitchen loperamide (IMODIUM A-D) 2 MG tablet Take 1 tablet (2 mg total) by mouth 2 (two) times daily as needed for diarrhea or loose stools. 20 tablet 0  . meloxicam (MOBIC) 15 MG tablet Take 15 mg by mouth daily.  1  . metoprolol succinate (TOPROL XL) 25 MG 24 hr tablet Take 1 tablet (25 mg total) by mouth daily. 90 tablet 1  . mirtazapine (REMERON) 15 MG tablet Take 1 tablet (15 mg total) by mouth at bedtime. 90 tablet 2  . omega-3 acid ethyl esters (LOVAZA) 1 G capsule Take by mouth 2 (two) times daily.    . OXYGEN-HELIUM IN Inhale 3 L into the lungs.    . predniSONE (DELTASONE) 5 MG tablet Take 8 tablets (40 mg total) by mouth daily. 40 mg daily x 2 days then 30 mg daily x 2 days then 20 mg daily x 2 days then 10mg  daily x 2 days then back to prior dose of 5 mg daily (  Patient taking differently: Take 5 mg by mouth daily. ) 90 tablet 0  . saccharomyces boulardii (FLORASTOR) 250 MG capsule Take 1 capsule (250 mg total) by mouth 2 (two) times daily. 20 capsule 0  . tiotropium (SPIRIVA) 18 MCG inhalation capsule Place 18 mcg into inhaler and inhale daily.    Marland Kitchen umeclidinium bromide (INCRUSE ELLIPTA) 62.5 MCG/INH AEPB Inhale 1 puff into the lungs daily.    . Vilazodone HCl (VIIBRYD) 40 MG TABS Take 1 tablet by mouth daily.     No current facility-administered medications for this visit.     Past Medical History:  Diagnosis Date  . Anxiety   . Back pain   . COPD (chronic obstructive pulmonary disease) (HCC)    Oxygen dependent  .  Depression   . Fibromyalgia   . GERD (gastroesophageal reflux disease)   . HTN (hypertension), malignant 10/27/2014  . IBS (irritable bowel syndrome)   . Neck pain   . Pneumonia   . Takotsubo cardiomyopathy    a. NSTEMI 04/2013 secondary to Takotsubo's CM - normal cors, EF 30%.    Past Surgical History:  Procedure Laterality Date  . ABDOMINAL HYSTERECTOMY    . C-Secition    . CARDIAC CATHETERIZATION    . CHOLECYSTECTOMY    . LEFT HEART CATHETERIZATION WITH CORONARY ANGIOGRAM N/A 05/04/2013   Procedure: LEFT HEART CATHETERIZATION WITH CORONARY ANGIOGRAM;  Surgeon: Wendall Stade, MD;  Location[facility] CATH LAB;  Service: Cardiovascular;  Laterality: N/A;    Social History   Socioeconomic History  . Marital status: Legally Separated    Spouse name: Not on file  . Number of children: Not on file  . Years of education: Not on file  . Highest education level: Not on file  Occupational History  . Occupation: disabled  Social Needs  . Financial resource strain: Not on file  . Food insecurity:    Worry: Not on file    Inability: Not on file  . Transportation needs:    Medical: Not on file    Non-medical: Not on file  Tobacco Use  . Smoking status: Former Smoker    Packs/day: 0.50    Years: 20.00    Pack years: 10.00    Types: Cigarettes    Start date: 04/19/1993    Last attempt to quit: 04/19/2013    Years since quitting: 5.6  . Smokeless tobacco: Current User  . Tobacco comment: e-cigg occiasionally  Substance and Sexual Activity  . Alcohol use: No    Alcohol/week: 0.0 standard drinks    Comment: 07-30-2016 per pt no but stopped in 2012  . Drug use: No    Comment: 07-30-2016 per pt no   . Sexual activity: Not Currently  Lifestyle  . Physical activity:    Days per week: Not on file    Minutes per session: Not on file  . Stress: Not on file  Relationships  . Social connections:    Talks on phone: Not on file    Gets together: Not on file    Attends religious service:  Not on file    Active member of club or organization: Not on file    Attends meetings of clubs or organizations: Not on file    Relationship status: Not on file  . Intimate partner violence:    Fear of current or ex partner: Not on file    Emotionally abused: Not on file    Physically abused: Not on file    Forced sexual  activity: Not on file  Other Topics Concern  . Not on file  Social History Narrative  . Not on file     Vitals:   12/12/18 1536  BP: 129/76  Pulse: 99  SpO2: 91%  Weight: 108 lb 9.6 oz (49.3 kg)  Height: 5\' 1"  (1.549 m)    Wt Readings from Last 3 Encounters:  12/12/18 108 lb 9.6 oz (49.3 kg)  09/07/18 103 lb 2.8 oz (46.8 kg)  09/19/17 103 lb (46.7 kg)     PHYSICAL EXAM General: NAD HEENT: Normal. Neck: No JVD, no thyromegaly. Lungs: Diffusely diminished breath sounds, no crackles or wheezes. CV: Regular rate and rhythm, normal S1/S2, no S3/S4, no murmur. No pretibial or periankle edema.     Abdomen: Soft, nontender, no distention.  Neurologic: Alert and oriented.  Psych: Normal affect. Skin: Normal. Musculoskeletal: No gross deformities.    ECG: Reviewed above under Subjective   Labs: Lab Results  Component Value Date/Time   K 3.4 (L) 09/06/2018 05:53 AM   BUN 20 09/06/2018 05:53 AM   CREATININE 0.71 09/06/2018 05:53 AM   ALT 18 09/06/2018 05:53 AM   TSH 0.376 05/02/2013 12:05 AM   HGB 9.3 (L) 09/07/2018 06:30 AM     Lipids: Lab Results  Component Value Date/Time   LDLCALC 69 05/03/2013 04:50 AM   CHOL 164 05/03/2013 04:50 AM   TRIG 142 09/05/2018 09:46 AM   HDL 80 05/03/2013 04:50 AM       ASSESSMENT AND PLAN: 1. Takotsubo cardiomyopathy: LV systolic function has since normalized. No further testing is warranted.Continue Toprol-XL.  2. COPD: Oxygen-dependent and stable.  [place] in [place].  3. Tachycardia:Controlled on Toprol-XL 25 mg daily. No changes.  4.  Essential hypertension: Controlled on  amlodipine and Toprol-XL.  She was not able to tolerate a higher dose of amlodipine as it led to feet and leg swelling.    Disposition: Follow up as needed   Prentice DockerSuresh Ebbie Cherry, M.D., F.A.C.C.

## 2018-12-12 NOTE — Patient Instructions (Signed)
Medication Instructions:  Continue all current medications.   Follow-Up: As needed   Any Other Special Instructions Will Be Listed Below (If Applicable).  If you need a refill on your cardiac medications before your next appointment, please call your pharmacy.  

## 2018-12-24 DIAGNOSIS — J449 Chronic obstructive pulmonary disease, unspecified: Secondary | ICD-10-CM | POA: Diagnosis not present

## 2018-12-24 DIAGNOSIS — I10 Essential (primary) hypertension: Secondary | ICD-10-CM | POA: Diagnosis not present

## 2018-12-24 DIAGNOSIS — J9612 Chronic respiratory failure with hypercapnia: Secondary | ICD-10-CM | POA: Diagnosis not present

## 2018-12-24 DIAGNOSIS — J9611 Chronic respiratory failure with hypoxia: Secondary | ICD-10-CM | POA: Diagnosis not present

## 2018-12-24 DIAGNOSIS — F329 Major depressive disorder, single episode, unspecified: Secondary | ICD-10-CM | POA: Diagnosis not present

## 2018-12-24 DIAGNOSIS — I2510 Atherosclerotic heart disease of native coronary artery without angina pectoris: Secondary | ICD-10-CM | POA: Diagnosis not present

## 2019-01-05 DIAGNOSIS — J9611 Chronic respiratory failure with hypoxia: Secondary | ICD-10-CM | POA: Diagnosis not present

## 2019-01-05 DIAGNOSIS — J449 Chronic obstructive pulmonary disease, unspecified: Secondary | ICD-10-CM | POA: Diagnosis not present

## 2019-01-06 DIAGNOSIS — J9611 Chronic respiratory failure with hypoxia: Secondary | ICD-10-CM | POA: Diagnosis not present

## 2019-01-06 DIAGNOSIS — I10 Essential (primary) hypertension: Secondary | ICD-10-CM | POA: Diagnosis not present

## 2019-01-06 DIAGNOSIS — F329 Major depressive disorder, single episode, unspecified: Secondary | ICD-10-CM | POA: Diagnosis not present

## 2019-01-06 DIAGNOSIS — J449 Chronic obstructive pulmonary disease, unspecified: Secondary | ICD-10-CM | POA: Diagnosis not present

## 2019-01-06 DIAGNOSIS — J9612 Chronic respiratory failure with hypercapnia: Secondary | ICD-10-CM | POA: Diagnosis not present

## 2019-01-06 DIAGNOSIS — G894 Chronic pain syndrome: Secondary | ICD-10-CM | POA: Diagnosis not present

## 2019-01-06 DIAGNOSIS — M545 Low back pain: Secondary | ICD-10-CM | POA: Diagnosis not present

## 2019-01-06 DIAGNOSIS — M4854XG Collapsed vertebra, not elsewhere classified, thoracic region, subsequent encounter for fracture with delayed healing: Secondary | ICD-10-CM | POA: Diagnosis not present

## 2019-01-06 DIAGNOSIS — I2510 Atherosclerotic heart disease of native coronary artery without angina pectoris: Secondary | ICD-10-CM | POA: Diagnosis not present

## 2019-01-06 DIAGNOSIS — G8929 Other chronic pain: Secondary | ICD-10-CM | POA: Diagnosis not present

## 2019-01-19 ENCOUNTER — Telehealth: Payer: Self-pay | Admitting: Cardiovascular Disease

## 2019-01-19 MED ORDER — AMLODIPINE BESYLATE 5 MG PO TABS: 5.0000 mg | ORAL_TABLET | Freq: Every day | ORAL | 2 refills | Status: AC

## 2019-01-19 MED ORDER — METOPROLOL SUCCINATE ER 25 MG PO TB24: 25.0000 mg | ORAL_TABLET | Freq: Every day | ORAL | 2 refills | Status: AC

## 2019-01-19 NOTE — Telephone Encounter (Signed)
° °  1. Which medications need to be refilled? (please list name of each medication and dose if known)  Norvasc 5mg  & Toprol 25 mg   2. Which pharmacy/location (including street and city if local pharmacy) is medication to be sent to?  [facility]   3. Do they need a 30 day or 90 day supply?

## 2019-01-20 ENCOUNTER — Other Ambulatory Visit: Payer: Self-pay

## 2019-01-20 ENCOUNTER — Encounter (HOSPITAL_COMMUNITY): Payer: Self-pay | Admitting: Psychiatry

## 2019-01-20 ENCOUNTER — Ambulatory Visit (INDEPENDENT_AMBULATORY_CARE_PROVIDER_SITE_OTHER): Payer: Medicare Other | Admitting: Psychiatry

## 2019-01-20 DIAGNOSIS — F322 Major depressive disorder, single episode, severe without psychotic features: Secondary | ICD-10-CM | POA: Diagnosis not present

## 2019-01-20 DIAGNOSIS — F419 Anxiety disorder, unspecified: Secondary | ICD-10-CM | POA: Diagnosis not present

## 2019-01-20 DIAGNOSIS — G8929 Other chronic pain: Secondary | ICD-10-CM

## 2019-01-20 DIAGNOSIS — Z87891 Personal history of nicotine dependence: Secondary | ICD-10-CM | POA: Diagnosis not present

## 2019-01-20 DIAGNOSIS — M797 Fibromyalgia: Secondary | ICD-10-CM | POA: Diagnosis not present

## 2019-01-20 MED ORDER — VILAZODONE HCL 40 MG PO TABS: 40.0000 mg | ORAL_TABLET | Freq: Every day | ORAL | 2 refills | Status: DC

## 2019-01-20 MED ORDER — MIRTAZAPINE 15 MG PO TABS: 15.0000 mg | ORAL_TABLET | Freq: Every day | ORAL | 2 refills | Status: DC

## 2019-01-20 MED ORDER — ALPRAZOLAM 1 MG PO TABS: 1.0000 mg | ORAL_TABLET | Freq: Three times a day (TID) | ORAL | 2 refills | Status: DC
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
## 2019-01-29 DIAGNOSIS — J3089 Other allergic rhinitis: Secondary | ICD-10-CM | POA: Diagnosis not present

## 2019-01-29 DIAGNOSIS — J449 Chronic obstructive pulmonary disease, unspecified: Secondary | ICD-10-CM | POA: Diagnosis not present

## 2019-01-29 DIAGNOSIS — J9611 Chronic respiratory failure with hypoxia: Secondary | ICD-10-CM | POA: Diagnosis not present

## 2019-01-29 DIAGNOSIS — J9612 Chronic respiratory failure with hypercapnia: Secondary | ICD-10-CM | POA: Diagnosis not present

## 2019-02-17 DIAGNOSIS — Z299 Encounter for prophylactic measures, unspecified: Secondary | ICD-10-CM | POA: Diagnosis not present

## 2019-02-17 DIAGNOSIS — Z6822 Body mass index (BMI) 22.0-22.9, adult: Secondary | ICD-10-CM | POA: Diagnosis not present

## 2019-02-17 DIAGNOSIS — F329 Major depressive disorder, single episode, unspecified: Secondary | ICD-10-CM | POA: Diagnosis not present

## 2019-02-17 DIAGNOSIS — I10 Essential (primary) hypertension: Secondary | ICD-10-CM | POA: Diagnosis not present

## 2019-02-17 DIAGNOSIS — J449 Chronic obstructive pulmonary disease, unspecified: Secondary | ICD-10-CM | POA: Diagnosis not present

## 2019-02-24 DIAGNOSIS — R062 Wheezing: Secondary | ICD-10-CM | POA: Diagnosis not present

## 2019-02-24 DIAGNOSIS — K219 Gastro-esophageal reflux disease without esophagitis: Secondary | ICD-10-CM | POA: Diagnosis present

## 2019-02-24 DIAGNOSIS — Z9049 Acquired absence of other specified parts of digestive tract: Secondary | ICD-10-CM | POA: Diagnosis not present

## 2019-02-24 DIAGNOSIS — J441 Chronic obstructive pulmonary disease with (acute) exacerbation: Secondary | ICD-10-CM | POA: Diagnosis not present

## 2019-02-24 DIAGNOSIS — R000 Tachycardia, unspecified: Secondary | ICD-10-CM | POA: Diagnosis present

## 2019-02-24 DIAGNOSIS — E860 Dehydration: Secondary | ICD-10-CM | POA: Diagnosis not present

## 2019-02-24 DIAGNOSIS — I10 Essential (primary) hypertension: Secondary | ICD-10-CM | POA: Diagnosis not present

## 2019-02-24 DIAGNOSIS — Z9981 Dependence on supplemental oxygen: Secondary | ICD-10-CM | POA: Diagnosis not present

## 2019-02-24 DIAGNOSIS — J9612 Chronic respiratory failure with hypercapnia: Secondary | ICD-10-CM | POA: Diagnosis not present

## 2019-02-24 DIAGNOSIS — Z9889 Other specified postprocedural states: Secondary | ICD-10-CM | POA: Diagnosis not present

## 2019-02-24 DIAGNOSIS — F329 Major depressive disorder, single episode, unspecified: Secondary | ICD-10-CM | POA: Diagnosis not present

## 2019-02-24 DIAGNOSIS — Z79899 Other long term (current) drug therapy: Secondary | ICD-10-CM | POA: Diagnosis not present

## 2019-02-24 DIAGNOSIS — R069 Unspecified abnormalities of breathing: Secondary | ICD-10-CM | POA: Diagnosis not present

## 2019-02-24 DIAGNOSIS — Z90711 Acquired absence of uterus with remaining cervical stump: Secondary | ICD-10-CM | POA: Diagnosis not present

## 2019-02-24 DIAGNOSIS — J449 Chronic obstructive pulmonary disease, unspecified: Secondary | ICD-10-CM | POA: Diagnosis not present

## 2019-02-24 DIAGNOSIS — Z888 Allergy status to other drugs, medicaments and biological substances status: Secondary | ICD-10-CM | POA: Diagnosis not present

## 2019-02-24 DIAGNOSIS — I2510 Atherosclerotic heart disease of native coronary artery without angina pectoris: Secondary | ICD-10-CM | POA: Diagnosis present

## 2019-02-24 DIAGNOSIS — E785 Hyperlipidemia, unspecified: Secondary | ICD-10-CM | POA: Diagnosis present

## 2019-03-03 DIAGNOSIS — G894 Chronic pain syndrome: Secondary | ICD-10-CM | POA: Diagnosis not present

## 2019-03-03 DIAGNOSIS — G8929 Other chronic pain: Secondary | ICD-10-CM | POA: Diagnosis not present

## 2019-03-03 DIAGNOSIS — M4854XG Collapsed vertebra, not elsewhere classified, thoracic region, subsequent encounter for fracture with delayed healing: Secondary | ICD-10-CM | POA: Diagnosis not present

## 2019-03-03 DIAGNOSIS — M545 Low back pain: Secondary | ICD-10-CM | POA: Diagnosis not present

## 2019-03-05 DIAGNOSIS — Z299 Encounter for prophylactic measures, unspecified: Secondary | ICD-10-CM | POA: Diagnosis not present

## 2019-03-05 DIAGNOSIS — Z6822 Body mass index (BMI) 22.0-22.9, adult: Secondary | ICD-10-CM | POA: Diagnosis not present

## 2019-03-05 DIAGNOSIS — J449 Chronic obstructive pulmonary disease, unspecified: Secondary | ICD-10-CM | POA: Diagnosis not present

## 2019-03-05 DIAGNOSIS — I10 Essential (primary) hypertension: Secondary | ICD-10-CM | POA: Diagnosis not present

## 2019-03-19 ENCOUNTER — Other Ambulatory Visit (HOSPITAL_COMMUNITY): Payer: Self-pay | Admitting: Psychiatry

## 2019-03-19 ENCOUNTER — Telehealth (HOSPITAL_COMMUNITY): Payer: Self-pay

## 2019-03-19 MED ORDER — DULOXETINE HCL 60 MG PO CPEP: 60.0000 mg | ORAL_CAPSULE | Freq: Every day | ORAL | 2 refills | Status: DC

## 2019-03-19 NOTE — Telephone Encounter (Signed)
Medication management - Telephone call with patient to inform Cymbalta was approved and sent into her [employer] by [employer] today.  Patient to call back if any problems with the medication prior to next appt 04/21/19.  Reviewed instructions with patient to verify she was aware of dosage and Dr. Harrington Challenger instructions.

## 2019-03-19 NOTE — Telephone Encounter (Signed)
Medication management - Pt called and stated she did not feel her Viibryd was working well enough and wanted to know if Dr. Harrington Challenger would consider changing her to Cymbalta.  Pt is scheduled next on 04/21/19 and wanted to know if could try prior to appt.

## 2019-03-19 NOTE — Telephone Encounter (Signed)
cymbalta sent in, shes been on it before and it didn't work after awhile, but I'll resend it if that's what she wants to do

## 2019-03-24 DIAGNOSIS — M810 Age-related osteoporosis without current pathological fracture: Secondary | ICD-10-CM | POA: Diagnosis not present

## 2019-03-25 DIAGNOSIS — J9612 Chronic respiratory failure with hypercapnia: Secondary | ICD-10-CM | POA: Diagnosis not present

## 2019-03-25 DIAGNOSIS — J9611 Chronic respiratory failure with hypoxia: Secondary | ICD-10-CM | POA: Diagnosis not present

## 2019-03-25 DIAGNOSIS — J441 Chronic obstructive pulmonary disease with (acute) exacerbation: Secondary | ICD-10-CM | POA: Diagnosis not present

## 2019-03-25 DIAGNOSIS — J309 Allergic rhinitis, unspecified: Secondary | ICD-10-CM | POA: Diagnosis not present

## 2019-04-01 DIAGNOSIS — Z6822 Body mass index (BMI) 22.0-22.9, adult: Secondary | ICD-10-CM | POA: Diagnosis not present

## 2019-04-01 DIAGNOSIS — J449 Chronic obstructive pulmonary disease, unspecified: Secondary | ICD-10-CM | POA: Diagnosis not present

## 2019-04-01 DIAGNOSIS — I10 Essential (primary) hypertension: Secondary | ICD-10-CM | POA: Diagnosis not present

## 2019-04-01 DIAGNOSIS — Z713 Dietary counseling and surveillance: Secondary | ICD-10-CM | POA: Diagnosis not present

## 2019-04-01 DIAGNOSIS — Z299 Encounter for prophylactic measures, unspecified: Secondary | ICD-10-CM | POA: Diagnosis not present

## 2019-04-17 ENCOUNTER — Other Ambulatory Visit (HOSPITAL_COMMUNITY): Payer: Self-pay | Admitting: Psychiatry

## 2019-04-20 DIAGNOSIS — G8929 Other chronic pain: Secondary | ICD-10-CM | POA: Diagnosis not present

## 2019-04-20 DIAGNOSIS — M4854XG Collapsed vertebra, not elsewhere classified, thoracic region, subsequent encounter for fracture with delayed healing: Secondary | ICD-10-CM | POA: Diagnosis not present

## 2019-04-20 DIAGNOSIS — M545 Low back pain: Secondary | ICD-10-CM | POA: Diagnosis not present

## 2019-04-20 DIAGNOSIS — G894 Chronic pain syndrome: Secondary | ICD-10-CM | POA: Diagnosis not present

## 2019-04-21 ENCOUNTER — Ambulatory Visit (INDEPENDENT_AMBULATORY_CARE_PROVIDER_SITE_OTHER): Payer: Medicare Other | Admitting: Psychiatry

## 2019-04-21 ENCOUNTER — Encounter (HOSPITAL_COMMUNITY): Payer: Self-pay | Admitting: Psychiatry

## 2019-04-21 ENCOUNTER — Other Ambulatory Visit: Payer: Self-pay

## 2019-04-21 DIAGNOSIS — F322 Major depressive disorder, single episode, severe without psychotic features: Secondary | ICD-10-CM | POA: Diagnosis not present

## 2019-04-21 MED ORDER — MIRTAZAPINE 15 MG PO TABS: 15.0000 mg | ORAL_TABLET | Freq: Every day | ORAL | 2 refills | Status: DC

## 2019-04-21 MED ORDER — DULOXETINE HCL 60 MG PO CPEP: 60.0000 mg | ORAL_CAPSULE | Freq: Every day | ORAL | 2 refills | Status: DC

## 2019-04-21 MED ORDER — ALPRAZOLAM 1 MG PO TABS: 1.0000 mg | ORAL_TABLET | Freq: Three times a day (TID) | ORAL | 2 refills | Status: DC
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
## 2019-04-22 IMAGING — DX DG CHEST 1V PORT
1 series · 1 of 1 positions shown · non-contrast
Comparison: Radiograph August 28, 2018.

CLINICAL DATA: Respiratory failure.

EXAM:
PORTABLE CHEST 1 VIEW

[chest ap]
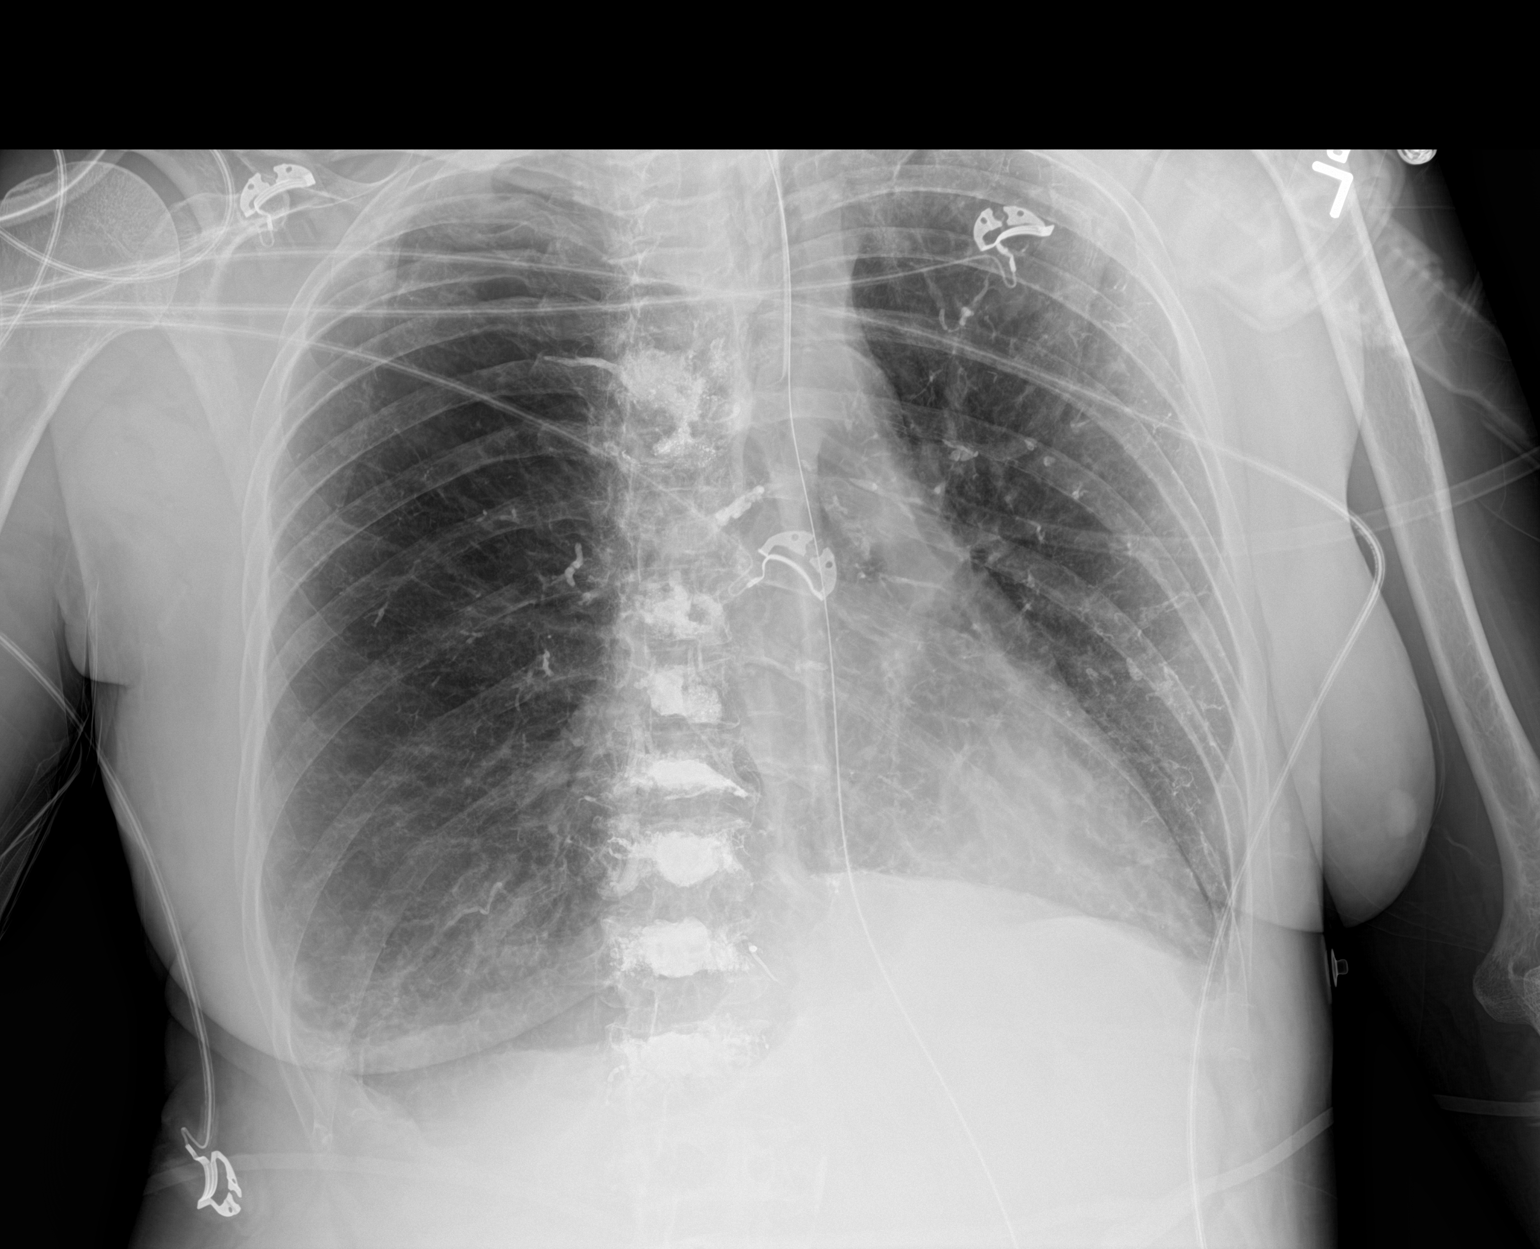

[1 of 1 positions shown; findings below may reference images not displayed]

FINDINGS: Endotracheal tube has been repositioned with distal tip 4 cm above
the carina. Nasogastric tube is seen entering the stomach. No
pneumothorax is noted. Left lung is clear. Minimal right basilar
subsegmental atelectasis is noted with small pleural effusion.
Status post kyphoplasty at multiple levels of the thoracic spine.
IMPRESSION: Endotracheal tube is in grossly good position currently. Nasogastric
tube is stable. Minimal right basilar subsegmental atelectasis is
noted with small right pleural effusion.
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
## 2019-04-27 IMAGING — DX DG CHEST 1V PORT
1 series · 1 of 1 positions shown · non-contrast
Comparison: 09/02/2018

CLINICAL DATA: Respiratory failure

EXAM:
PORTABLE CHEST 1 VIEW

[chest]
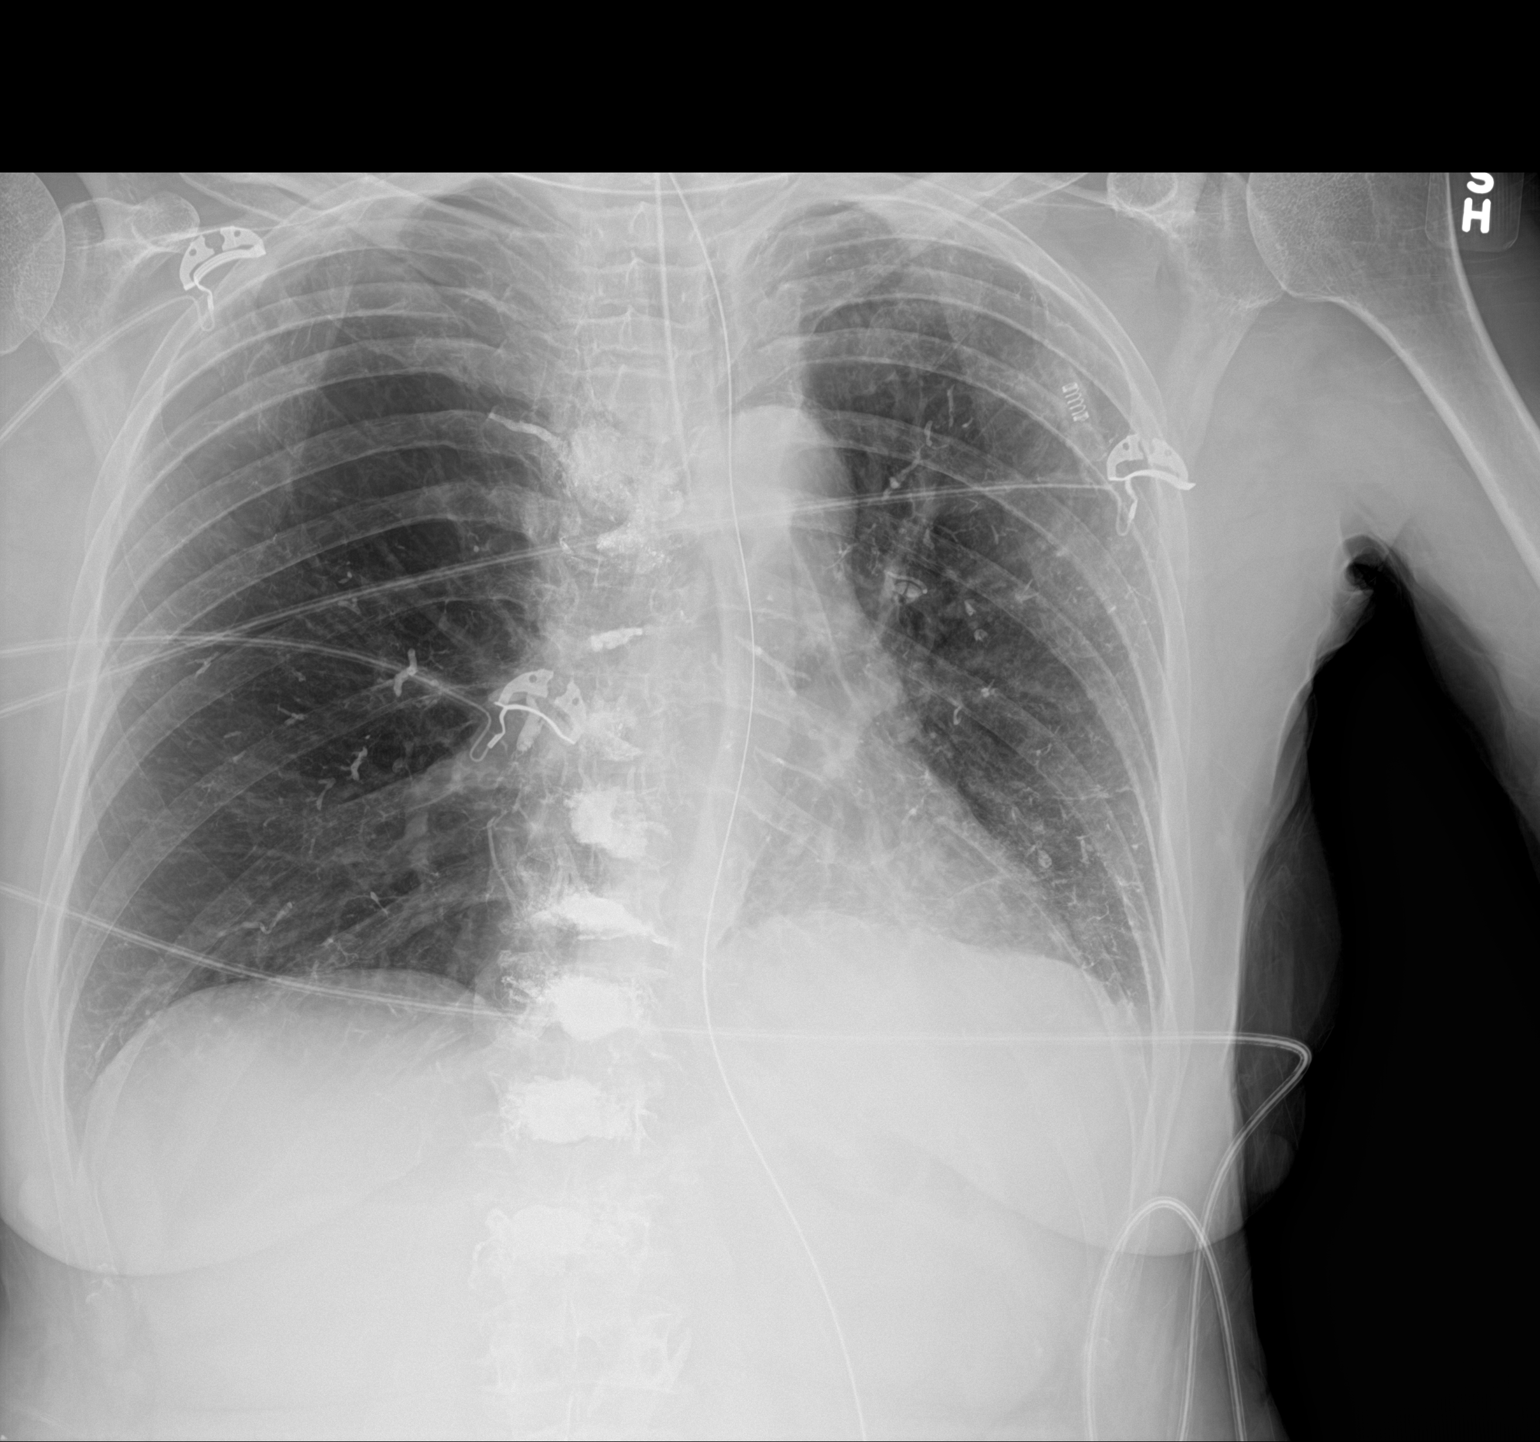

[1 of 1 positions shown; findings below may reference images not displayed]

FINDINGS: Cardiac shadows within normal limits. Changes consistent with
multilevel vertebral augmentation are again noted. Multiple cement
emboli are identified within both lungs. No focal infiltrate or
sizable effusion is seen. No bony abnormality is noted. Endotracheal
tube and nasogastric catheter are again seen and stable.
IMPRESSION: Overall stable appearance of the chest.  No acute abnormality noted.

Diffuse cement embolization related to prior vertebral augmentation.

## 2019-04-28 DIAGNOSIS — N893 Dysplasia of vagina, unspecified: Secondary | ICD-10-CM | POA: Diagnosis not present

## 2019-04-28 DIAGNOSIS — J449 Chronic obstructive pulmonary disease, unspecified: Secondary | ICD-10-CM | POA: Diagnosis not present

## 2019-04-28 DIAGNOSIS — Z87410 Personal history of cervical dysplasia: Secondary | ICD-10-CM | POA: Diagnosis not present

## 2019-04-28 DIAGNOSIS — Z9981 Dependence on supplemental oxygen: Secondary | ICD-10-CM | POA: Diagnosis not present

## 2019-05-20 DIAGNOSIS — I2510 Atherosclerotic heart disease of native coronary artery without angina pectoris: Secondary | ICD-10-CM | POA: Diagnosis not present

## 2019-05-20 DIAGNOSIS — Z6822 Body mass index (BMI) 22.0-22.9, adult: Secondary | ICD-10-CM | POA: Diagnosis not present

## 2019-05-20 DIAGNOSIS — I10 Essential (primary) hypertension: Secondary | ICD-10-CM | POA: Diagnosis not present

## 2019-05-20 DIAGNOSIS — J449 Chronic obstructive pulmonary disease, unspecified: Secondary | ICD-10-CM | POA: Diagnosis not present

## 2019-05-20 DIAGNOSIS — Z299 Encounter for prophylactic measures, unspecified: Secondary | ICD-10-CM | POA: Diagnosis not present

## 2019-05-21 DIAGNOSIS — J449 Chronic obstructive pulmonary disease, unspecified: Secondary | ICD-10-CM | POA: Diagnosis not present

## 2019-06-11 DIAGNOSIS — Z79891 Long term (current) use of opiate analgesic: Secondary | ICD-10-CM | POA: Diagnosis not present

## 2019-06-11 DIAGNOSIS — M545 Low back pain: Secondary | ICD-10-CM | POA: Diagnosis not present

## 2019-06-11 DIAGNOSIS — M4854XG Collapsed vertebra, not elsewhere classified, thoracic region, subsequent encounter for fracture with delayed healing: Secondary | ICD-10-CM | POA: Diagnosis not present

## 2019-06-11 DIAGNOSIS — G8929 Other chronic pain: Secondary | ICD-10-CM | POA: Diagnosis not present

## 2019-06-11 DIAGNOSIS — G894 Chronic pain syndrome: Secondary | ICD-10-CM | POA: Diagnosis not present

## 2019-07-03 DIAGNOSIS — Z1159 Encounter for screening for other viral diseases: Secondary | ICD-10-CM | POA: Diagnosis not present

## 2019-07-08 DIAGNOSIS — G4733 Obstructive sleep apnea (adult) (pediatric): Secondary | ICD-10-CM | POA: Diagnosis not present

## 2019-07-13 ENCOUNTER — Other Ambulatory Visit (HOSPITAL_COMMUNITY): Payer: Self-pay | Admitting: Psychiatry

## 2019-07-27 ENCOUNTER — Ambulatory Visit (INDEPENDENT_AMBULATORY_CARE_PROVIDER_SITE_OTHER): Payer: Medicare Other | Admitting: Psychiatry

## 2019-07-27 ENCOUNTER — Encounter (HOSPITAL_COMMUNITY): Payer: Self-pay | Admitting: Psychiatry

## 2019-07-27 ENCOUNTER — Other Ambulatory Visit: Payer: Self-pay

## 2019-07-27 DIAGNOSIS — F322 Major depressive disorder, single episode, severe without psychotic features: Secondary | ICD-10-CM

## 2019-07-27 MED ORDER — DULOXETINE HCL 60 MG PO CPEP: 60.0000 mg | ORAL_CAPSULE | Freq: Every day | ORAL | 2 refills | Status: DC

## 2019-07-27 MED ORDER — DULOXETINE HCL 30 MG PO CPEP: 30.0000 mg | ORAL_CAPSULE | Freq: Every day | ORAL | 2 refills | Status: DC

## 2019-07-27 MED ORDER — MIRTAZAPINE 15 MG PO TABS: 15.0000 mg | ORAL_TABLET | Freq: Every day | ORAL | 2 refills | Status: DC

## 2019-07-27 MED ORDER — ALPRAZOLAM 1 MG PO TABS: 1.0000 mg | ORAL_TABLET | Freq: Three times a day (TID) | ORAL | 2 refills | Status: DC
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
## 2019-07-30 DIAGNOSIS — N893 Dysplasia of vagina, unspecified: Secondary | ICD-10-CM | POA: Diagnosis not present

## 2019-08-10 DIAGNOSIS — M4854XG Collapsed vertebra, not elsewhere classified, thoracic region, subsequent encounter for fracture with delayed healing: Secondary | ICD-10-CM | POA: Diagnosis not present

## 2019-08-10 DIAGNOSIS — M545 Low back pain: Secondary | ICD-10-CM | POA: Diagnosis not present

## 2019-08-10 DIAGNOSIS — G894 Chronic pain syndrome: Secondary | ICD-10-CM | POA: Diagnosis not present

## 2019-08-10 DIAGNOSIS — G8929 Other chronic pain: Secondary | ICD-10-CM | POA: Diagnosis not present

## 2019-08-24 ENCOUNTER — Ambulatory Visit (INDEPENDENT_AMBULATORY_CARE_PROVIDER_SITE_OTHER): Payer: Medicare Other | Admitting: Psychiatry

## 2019-08-24 ENCOUNTER — Other Ambulatory Visit: Payer: Self-pay

## 2019-08-24 ENCOUNTER — Encounter (HOSPITAL_COMMUNITY): Payer: Self-pay | Admitting: Psychiatry

## 2019-08-24 DIAGNOSIS — F322 Major depressive disorder, single episode, severe without psychotic features: Secondary | ICD-10-CM | POA: Diagnosis not present

## 2019-08-24 MED ORDER — MIRTAZAPINE 15 MG PO TABS: 15.0000 mg | ORAL_TABLET | Freq: Every day | ORAL | 2 refills | Status: DC

## 2019-08-24 MED ORDER — DULOXETINE HCL 60 MG PO CPEP: 60.0000 mg | ORAL_CAPSULE | Freq: Every day | ORAL | 2 refills | Status: DC

## 2019-08-24 MED ORDER — DULOXETINE HCL 30 MG PO CPEP: 30.0000 mg | ORAL_CAPSULE | Freq: Every day | ORAL | 2 refills | Status: DC

## 2019-08-24 MED ORDER — ALPRAZOLAM 1 MG PO TABS: 1.0000 mg | ORAL_TABLET | Freq: Three times a day (TID) | ORAL | 2 refills | Status: DC
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
## 2019-08-27 DIAGNOSIS — J449 Chronic obstructive pulmonary disease, unspecified: Secondary | ICD-10-CM | POA: Diagnosis not present

## 2019-08-27 DIAGNOSIS — J9612 Chronic respiratory failure with hypercapnia: Secondary | ICD-10-CM | POA: Diagnosis not present

## 2019-08-27 DIAGNOSIS — J9611 Chronic respiratory failure with hypoxia: Secondary | ICD-10-CM | POA: Diagnosis not present

## 2019-08-31 DIAGNOSIS — Z79891 Long term (current) use of opiate analgesic: Secondary | ICD-10-CM | POA: Diagnosis not present

## 2019-08-31 DIAGNOSIS — I2510 Atherosclerotic heart disease of native coronary artery without angina pectoris: Secondary | ICD-10-CM | POA: Diagnosis present

## 2019-08-31 DIAGNOSIS — R9431 Abnormal electrocardiogram [ECG] [EKG]: Secondary | ICD-10-CM | POA: Diagnosis not present

## 2019-08-31 DIAGNOSIS — J9621 Acute and chronic respiratory failure with hypoxia: Secondary | ICD-10-CM | POA: Diagnosis present

## 2019-08-31 DIAGNOSIS — Z90711 Acquired absence of uterus with remaining cervical stump: Secondary | ICD-10-CM | POA: Diagnosis not present

## 2019-08-31 DIAGNOSIS — Z9981 Dependence on supplemental oxygen: Secondary | ICD-10-CM | POA: Diagnosis not present

## 2019-08-31 DIAGNOSIS — R0902 Hypoxemia: Secondary | ICD-10-CM | POA: Diagnosis not present

## 2019-08-31 DIAGNOSIS — E785 Hyperlipidemia, unspecified: Secondary | ICD-10-CM | POA: Diagnosis not present

## 2019-08-31 DIAGNOSIS — Z20828 Contact with and (suspected) exposure to other viral communicable diseases: Secondary | ICD-10-CM | POA: Diagnosis present

## 2019-08-31 DIAGNOSIS — J9602 Acute respiratory failure with hypercapnia: Secondary | ICD-10-CM | POA: Diagnosis not present

## 2019-08-31 DIAGNOSIS — J9601 Acute respiratory failure with hypoxia: Secondary | ICD-10-CM | POA: Diagnosis not present

## 2019-08-31 DIAGNOSIS — Z888 Allergy status to other drugs, medicaments and biological substances status: Secondary | ICD-10-CM | POA: Diagnosis not present

## 2019-08-31 DIAGNOSIS — Z87891 Personal history of nicotine dependence: Secondary | ICD-10-CM | POA: Diagnosis not present

## 2019-08-31 DIAGNOSIS — J441 Chronic obstructive pulmonary disease with (acute) exacerbation: Secondary | ICD-10-CM | POA: Diagnosis not present

## 2019-08-31 DIAGNOSIS — K219 Gastro-esophageal reflux disease without esophagitis: Secondary | ICD-10-CM | POA: Diagnosis not present

## 2019-08-31 DIAGNOSIS — E872 Acidosis: Secondary | ICD-10-CM | POA: Diagnosis present

## 2019-08-31 DIAGNOSIS — J449 Chronic obstructive pulmonary disease, unspecified: Secondary | ICD-10-CM | POA: Diagnosis not present

## 2019-08-31 DIAGNOSIS — Z79899 Other long term (current) drug therapy: Secondary | ICD-10-CM | POA: Diagnosis not present

## 2019-08-31 DIAGNOSIS — Z881 Allergy status to other antibiotic agents status: Secondary | ICD-10-CM | POA: Diagnosis not present

## 2019-08-31 DIAGNOSIS — Z9049 Acquired absence of other specified parts of digestive tract: Secondary | ICD-10-CM | POA: Diagnosis not present

## 2019-08-31 DIAGNOSIS — I10 Essential (primary) hypertension: Secondary | ICD-10-CM | POA: Diagnosis not present

## 2019-09-04 DIAGNOSIS — K219 Gastro-esophageal reflux disease without esophagitis: Secondary | ICD-10-CM | POA: Diagnosis not present

## 2019-09-04 DIAGNOSIS — Z9981 Dependence on supplemental oxygen: Secondary | ICD-10-CM | POA: Diagnosis not present

## 2019-09-04 DIAGNOSIS — Z7952 Long term (current) use of systemic steroids: Secondary | ICD-10-CM | POA: Diagnosis not present

## 2019-09-04 DIAGNOSIS — J9621 Acute and chronic respiratory failure with hypoxia: Secondary | ICD-10-CM | POA: Diagnosis not present

## 2019-09-04 DIAGNOSIS — K589 Irritable bowel syndrome without diarrhea: Secondary | ICD-10-CM | POA: Diagnosis not present

## 2019-09-04 DIAGNOSIS — I2510 Atherosclerotic heart disease of native coronary artery without angina pectoris: Secondary | ICD-10-CM | POA: Diagnosis not present

## 2019-09-04 DIAGNOSIS — F329 Major depressive disorder, single episode, unspecified: Secondary | ICD-10-CM | POA: Diagnosis not present

## 2019-09-04 DIAGNOSIS — J441 Chronic obstructive pulmonary disease with (acute) exacerbation: Secondary | ICD-10-CM | POA: Diagnosis not present

## 2019-09-04 DIAGNOSIS — D509 Iron deficiency anemia, unspecified: Secondary | ICD-10-CM | POA: Diagnosis not present

## 2019-09-04 DIAGNOSIS — Z87891 Personal history of nicotine dependence: Secondary | ICD-10-CM | POA: Diagnosis not present

## 2019-09-04 DIAGNOSIS — I10 Essential (primary) hypertension: Secondary | ICD-10-CM | POA: Diagnosis not present

## 2019-09-04 DIAGNOSIS — E785 Hyperlipidemia, unspecified: Secondary | ICD-10-CM | POA: Diagnosis not present

## 2019-09-17 DIAGNOSIS — J449 Chronic obstructive pulmonary disease, unspecified: Secondary | ICD-10-CM | POA: Diagnosis not present

## 2019-10-10 DIAGNOSIS — G9341 Metabolic encephalopathy: Secondary | ICD-10-CM | POA: Diagnosis not present

## 2019-10-10 DIAGNOSIS — R0789 Other chest pain: Secondary | ICD-10-CM | POA: Diagnosis not present

## 2019-10-10 DIAGNOSIS — I10 Essential (primary) hypertension: Secondary | ICD-10-CM | POA: Diagnosis not present

## 2019-10-10 DIAGNOSIS — Z20828 Contact with and (suspected) exposure to other viral communicable diseases: Secondary | ICD-10-CM | POA: Diagnosis not present

## 2019-10-10 DIAGNOSIS — E785 Hyperlipidemia, unspecified: Secondary | ICD-10-CM | POA: Diagnosis not present

## 2019-10-10 DIAGNOSIS — J9622 Acute and chronic respiratory failure with hypercapnia: Secondary | ICD-10-CM | POA: Diagnosis not present

## 2019-10-10 DIAGNOSIS — I2510 Atherosclerotic heart disease of native coronary artery without angina pectoris: Secondary | ICD-10-CM | POA: Diagnosis not present

## 2019-10-10 DIAGNOSIS — J9621 Acute and chronic respiratory failure with hypoxia: Secondary | ICD-10-CM | POA: Diagnosis not present

## 2019-10-10 DIAGNOSIS — J441 Chronic obstructive pulmonary disease with (acute) exacerbation: Secondary | ICD-10-CM | POA: Diagnosis not present

## 2019-10-10 DIAGNOSIS — Z1159 Encounter for screening for other viral diseases: Secondary | ICD-10-CM | POA: Diagnosis not present

## 2019-10-10 DIAGNOSIS — R0602 Shortness of breath: Secondary | ICD-10-CM | POA: Diagnosis not present

## 2019-10-10 DIAGNOSIS — I252 Old myocardial infarction: Secondary | ICD-10-CM | POA: Diagnosis not present

## 2019-10-10 DIAGNOSIS — E872 Acidosis: Secondary | ICD-10-CM | POA: Diagnosis not present

## 2019-10-17 DIAGNOSIS — J449 Chronic obstructive pulmonary disease, unspecified: Secondary | ICD-10-CM | POA: Diagnosis not present

## 2019-10-19 DIAGNOSIS — J449 Chronic obstructive pulmonary disease, unspecified: Secondary | ICD-10-CM | POA: Diagnosis not present

## 2019-10-19 DIAGNOSIS — J9610 Chronic respiratory failure, unspecified whether with hypoxia or hypercapnia: Secondary | ICD-10-CM | POA: Diagnosis not present

## 2019-10-20 DIAGNOSIS — J441 Chronic obstructive pulmonary disease with (acute) exacerbation: Secondary | ICD-10-CM | POA: Diagnosis not present

## 2019-10-20 DIAGNOSIS — K219 Gastro-esophageal reflux disease without esophagitis: Secondary | ICD-10-CM | POA: Diagnosis not present

## 2019-10-20 DIAGNOSIS — D692 Other nonthrombocytopenic purpura: Secondary | ICD-10-CM | POA: Diagnosis not present

## 2019-10-20 DIAGNOSIS — I10 Essential (primary) hypertension: Secondary | ICD-10-CM | POA: Diagnosis not present

## 2019-10-20 DIAGNOSIS — Z87891 Personal history of nicotine dependence: Secondary | ICD-10-CM | POA: Diagnosis not present

## 2019-10-20 DIAGNOSIS — Z6823 Body mass index (BMI) 23.0-23.9, adult: Secondary | ICD-10-CM | POA: Diagnosis not present

## 2019-10-20 DIAGNOSIS — J9611 Chronic respiratory failure with hypoxia: Secondary | ICD-10-CM | POA: Diagnosis not present

## 2019-10-20 DIAGNOSIS — Z299 Encounter for prophylactic measures, unspecified: Secondary | ICD-10-CM | POA: Diagnosis not present

## 2019-10-24 DIAGNOSIS — W19XXXA Unspecified fall, initial encounter: Secondary | ICD-10-CM | POA: Diagnosis not present

## 2019-10-24 DIAGNOSIS — M79631 Pain in right forearm: Secondary | ICD-10-CM | POA: Diagnosis not present

## 2019-10-26 ENCOUNTER — Other Ambulatory Visit (HOSPITAL_COMMUNITY): Payer: Self-pay | Admitting: Psychiatry

## 2019-10-26 ENCOUNTER — Telehealth (HOSPITAL_COMMUNITY): Payer: Self-pay | Admitting: *Deleted

## 2019-10-26 MED ORDER — DULOXETINE HCL 30 MG PO CPEP: 30.0000 mg | ORAL_CAPSULE | Freq: Every day | ORAL | 2 refills | Status: DC

## 2019-10-26 MED ORDER — DULOXETINE HCL 60 MG PO CPEP: 60.0000 mg | ORAL_CAPSULE | Freq: Every day | ORAL | 2 refills | Status: DC

## 2019-10-26 NOTE — Telephone Encounter (Signed)
sent 

## 2019-10-26 NOTE — Telephone Encounter (Signed)
PATIENT LVM REQUESTING REFILLS ON CYMBALTA 30 MG & 60 MG NEXT APPT  11/25/19

## 2019-11-03 DIAGNOSIS — Z881 Allergy status to other antibiotic agents status: Secondary | ICD-10-CM | POA: Diagnosis not present

## 2019-11-03 DIAGNOSIS — S81811A Laceration without foreign body, right lower leg, initial encounter: Secondary | ICD-10-CM | POA: Diagnosis not present

## 2019-11-03 DIAGNOSIS — W268XXA Contact with other sharp object(s), not elsewhere classified, initial encounter: Secondary | ICD-10-CM | POA: Diagnosis not present

## 2019-11-03 DIAGNOSIS — I10 Essential (primary) hypertension: Secondary | ICD-10-CM | POA: Diagnosis not present

## 2019-11-03 DIAGNOSIS — Z79899 Other long term (current) drug therapy: Secondary | ICD-10-CM | POA: Diagnosis not present

## 2019-11-03 DIAGNOSIS — S8991XA Unspecified injury of right lower leg, initial encounter: Secondary | ICD-10-CM | POA: Diagnosis not present

## 2019-11-03 DIAGNOSIS — J439 Emphysema, unspecified: Secondary | ICD-10-CM | POA: Diagnosis not present

## 2019-11-11 DIAGNOSIS — M940 Chondrocostal junction syndrome [Tietze]: Secondary | ICD-10-CM | POA: Diagnosis not present

## 2019-11-11 DIAGNOSIS — J449 Chronic obstructive pulmonary disease, unspecified: Secondary | ICD-10-CM | POA: Diagnosis not present

## 2019-11-11 DIAGNOSIS — J9621 Acute and chronic respiratory failure with hypoxia: Secondary | ICD-10-CM | POA: Diagnosis not present

## 2019-11-11 DIAGNOSIS — J9622 Acute and chronic respiratory failure with hypercapnia: Secondary | ICD-10-CM | POA: Diagnosis not present

## 2019-11-17 DIAGNOSIS — J449 Chronic obstructive pulmonary disease, unspecified: Secondary | ICD-10-CM | POA: Diagnosis not present

## 2019-11-19 DIAGNOSIS — J9610 Chronic respiratory failure, unspecified whether with hypoxia or hypercapnia: Secondary | ICD-10-CM | POA: Diagnosis not present

## 2019-11-19 DIAGNOSIS — J449 Chronic obstructive pulmonary disease, unspecified: Secondary | ICD-10-CM | POA: Diagnosis not present

## 2019-11-23 DIAGNOSIS — L03115 Cellulitis of right lower limb: Secondary | ICD-10-CM | POA: Diagnosis not present

## 2019-11-23 DIAGNOSIS — Z299 Encounter for prophylactic measures, unspecified: Secondary | ICD-10-CM | POA: Diagnosis not present

## 2019-11-23 DIAGNOSIS — J449 Chronic obstructive pulmonary disease, unspecified: Secondary | ICD-10-CM | POA: Diagnosis not present

## 2019-11-23 DIAGNOSIS — J9611 Chronic respiratory failure with hypoxia: Secondary | ICD-10-CM | POA: Diagnosis not present

## 2019-11-23 DIAGNOSIS — Z87891 Personal history of nicotine dependence: Secondary | ICD-10-CM | POA: Diagnosis not present

## 2019-11-23 DIAGNOSIS — I10 Essential (primary) hypertension: Secondary | ICD-10-CM | POA: Diagnosis not present

## 2019-11-23 DIAGNOSIS — Z6823 Body mass index (BMI) 23.0-23.9, adult: Secondary | ICD-10-CM | POA: Diagnosis not present

## 2019-11-23 DIAGNOSIS — Z2821 Immunization not carried out because of patient refusal: Secondary | ICD-10-CM | POA: Diagnosis not present

## 2019-11-25 ENCOUNTER — Other Ambulatory Visit: Payer: Self-pay

## 2019-11-25 ENCOUNTER — Ambulatory Visit (INDEPENDENT_AMBULATORY_CARE_PROVIDER_SITE_OTHER): Payer: Medicare PPO | Admitting: Psychiatry

## 2019-11-25 ENCOUNTER — Encounter (HOSPITAL_COMMUNITY): Payer: Self-pay | Admitting: Psychiatry

## 2019-11-25 DIAGNOSIS — F322 Major depressive disorder, single episode, severe without psychotic features: Secondary | ICD-10-CM

## 2019-11-25 MED ORDER — DULOXETINE HCL 60 MG PO CPEP: 60.0000 mg | ORAL_CAPSULE | Freq: Every day | ORAL | 2 refills | Status: DC

## 2019-11-25 MED ORDER — MIRTAZAPINE 15 MG PO TABS: 15.0000 mg | ORAL_TABLET | Freq: Every day | ORAL | 2 refills | Status: DC

## 2019-11-25 MED ORDER — ALPRAZOLAM 1 MG PO TABS: 1.0000 mg | ORAL_TABLET | Freq: Three times a day (TID) | ORAL | 2 refills | Status: DC

## 2019-11-25 MED ORDER — DULOXETINE HCL 30 MG PO CPEP: 30.0000 mg | ORAL_CAPSULE | Freq: Every day | ORAL | 2 refills | Status: DC
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
## 2019-11-27 DIAGNOSIS — J9622 Acute and chronic respiratory failure with hypercapnia: Secondary | ICD-10-CM | POA: Diagnosis not present

## 2019-11-27 DIAGNOSIS — J9621 Acute and chronic respiratory failure with hypoxia: Secondary | ICD-10-CM | POA: Diagnosis not present

## 2019-11-27 DIAGNOSIS — M940 Chondrocostal junction syndrome [Tietze]: Secondary | ICD-10-CM | POA: Diagnosis not present

## 2019-11-27 DIAGNOSIS — J449 Chronic obstructive pulmonary disease, unspecified: Secondary | ICD-10-CM | POA: Diagnosis not present

## 2019-12-02 DIAGNOSIS — H2511 Age-related nuclear cataract, right eye: Secondary | ICD-10-CM | POA: Diagnosis not present

## 2019-12-02 DIAGNOSIS — H2513 Age-related nuclear cataract, bilateral: Secondary | ICD-10-CM | POA: Diagnosis not present

## 2019-12-03 DIAGNOSIS — J449 Chronic obstructive pulmonary disease, unspecified: Secondary | ICD-10-CM | POA: Diagnosis not present

## 2019-12-03 DIAGNOSIS — J309 Allergic rhinitis, unspecified: Secondary | ICD-10-CM | POA: Diagnosis not present

## 2019-12-03 DIAGNOSIS — J9611 Chronic respiratory failure with hypoxia: Secondary | ICD-10-CM | POA: Diagnosis not present

## 2019-12-03 DIAGNOSIS — Z7952 Long term (current) use of systemic steroids: Secondary | ICD-10-CM | POA: Diagnosis not present

## 2019-12-03 DIAGNOSIS — J9612 Chronic respiratory failure with hypercapnia: Secondary | ICD-10-CM | POA: Diagnosis not present

## 2019-12-07 DIAGNOSIS — M940 Chondrocostal junction syndrome [Tietze]: Secondary | ICD-10-CM | POA: Diagnosis not present

## 2019-12-07 DIAGNOSIS — G894 Chronic pain syndrome: Secondary | ICD-10-CM | POA: Diagnosis not present

## 2019-12-07 DIAGNOSIS — M4856XG Collapsed vertebra, not elsewhere classified, lumbar region, subsequent encounter for fracture with delayed healing: Secondary | ICD-10-CM | POA: Diagnosis not present

## 2019-12-07 DIAGNOSIS — M797 Fibromyalgia: Secondary | ICD-10-CM | POA: Diagnosis not present

## 2019-12-07 DIAGNOSIS — M542 Cervicalgia: Secondary | ICD-10-CM | POA: Diagnosis not present

## 2019-12-07 DIAGNOSIS — G8929 Other chronic pain: Secondary | ICD-10-CM | POA: Diagnosis not present

## 2019-12-07 DIAGNOSIS — M4854XG Collapsed vertebra, not elsewhere classified, thoracic region, subsequent encounter for fracture with delayed healing: Secondary | ICD-10-CM | POA: Diagnosis not present

## 2019-12-07 DIAGNOSIS — M810 Age-related osteoporosis without current pathological fracture: Secondary | ICD-10-CM | POA: Diagnosis not present

## 2019-12-07 DIAGNOSIS — M545 Low back pain: Secondary | ICD-10-CM | POA: Diagnosis not present

## 2019-12-09 DIAGNOSIS — J440 Chronic obstructive pulmonary disease with acute lower respiratory infection: Secondary | ICD-10-CM | POA: Diagnosis not present

## 2019-12-09 DIAGNOSIS — J449 Chronic obstructive pulmonary disease, unspecified: Secondary | ICD-10-CM | POA: Diagnosis not present

## 2019-12-09 DIAGNOSIS — E785 Hyperlipidemia, unspecified: Secondary | ICD-10-CM | POA: Diagnosis not present

## 2019-12-09 DIAGNOSIS — J181 Lobar pneumonia, unspecified organism: Secondary | ICD-10-CM | POA: Diagnosis not present

## 2019-12-09 DIAGNOSIS — J9622 Acute and chronic respiratory failure with hypercapnia: Secondary | ICD-10-CM | POA: Diagnosis not present

## 2019-12-09 DIAGNOSIS — R062 Wheezing: Secondary | ICD-10-CM | POA: Diagnosis not present

## 2019-12-09 DIAGNOSIS — J209 Acute bronchitis, unspecified: Secondary | ICD-10-CM | POA: Diagnosis not present

## 2019-12-09 DIAGNOSIS — R531 Weakness: Secondary | ICD-10-CM | POA: Diagnosis not present

## 2019-12-09 DIAGNOSIS — J441 Chronic obstructive pulmonary disease with (acute) exacerbation: Secondary | ICD-10-CM | POA: Diagnosis not present

## 2019-12-09 DIAGNOSIS — J9621 Acute and chronic respiratory failure with hypoxia: Secondary | ICD-10-CM | POA: Diagnosis not present

## 2019-12-09 DIAGNOSIS — Z299 Encounter for prophylactic measures, unspecified: Secondary | ICD-10-CM | POA: Diagnosis not present

## 2019-12-09 DIAGNOSIS — Z6823 Body mass index (BMI) 23.0-23.9, adult: Secondary | ICD-10-CM | POA: Diagnosis not present

## 2019-12-09 DIAGNOSIS — Z87891 Personal history of nicotine dependence: Secondary | ICD-10-CM | POA: Diagnosis not present

## 2019-12-09 DIAGNOSIS — L03115 Cellulitis of right lower limb: Secondary | ICD-10-CM | POA: Diagnosis not present

## 2019-12-09 DIAGNOSIS — I10 Essential (primary) hypertension: Secondary | ICD-10-CM | POA: Diagnosis not present

## 2019-12-09 DIAGNOSIS — K219 Gastro-esophageal reflux disease without esophagitis: Secondary | ICD-10-CM | POA: Diagnosis not present

## 2019-12-09 DIAGNOSIS — F418 Other specified anxiety disorders: Secondary | ICD-10-CM | POA: Diagnosis not present

## 2019-12-09 DIAGNOSIS — J9611 Chronic respiratory failure with hypoxia: Secondary | ICD-10-CM | POA: Diagnosis not present

## 2019-12-15 DIAGNOSIS — J449 Chronic obstructive pulmonary disease, unspecified: Secondary | ICD-10-CM | POA: Diagnosis not present

## 2019-12-17 DIAGNOSIS — J449 Chronic obstructive pulmonary disease, unspecified: Secondary | ICD-10-CM | POA: Diagnosis not present

## 2019-12-17 DIAGNOSIS — J9610 Chronic respiratory failure, unspecified whether with hypoxia or hypercapnia: Secondary | ICD-10-CM | POA: Diagnosis not present

## 2019-12-18 DIAGNOSIS — Z299 Encounter for prophylactic measures, unspecified: Secondary | ICD-10-CM | POA: Diagnosis not present

## 2019-12-18 DIAGNOSIS — R0602 Shortness of breath: Secondary | ICD-10-CM | POA: Diagnosis not present

## 2019-12-18 DIAGNOSIS — J189 Pneumonia, unspecified organism: Secondary | ICD-10-CM | POA: Diagnosis not present

## 2019-12-18 DIAGNOSIS — J9611 Chronic respiratory failure with hypoxia: Secondary | ICD-10-CM | POA: Diagnosis not present

## 2019-12-18 DIAGNOSIS — I10 Essential (primary) hypertension: Secondary | ICD-10-CM | POA: Diagnosis not present

## 2019-12-18 DIAGNOSIS — K219 Gastro-esophageal reflux disease without esophagitis: Secondary | ICD-10-CM | POA: Diagnosis not present

## 2020-01-07 DIAGNOSIS — Z79891 Long term (current) use of opiate analgesic: Secondary | ICD-10-CM | POA: Diagnosis not present

## 2020-01-07 DIAGNOSIS — Z79899 Other long term (current) drug therapy: Secondary | ICD-10-CM | POA: Diagnosis not present

## 2020-01-07 DIAGNOSIS — J441 Chronic obstructive pulmonary disease with (acute) exacerbation: Secondary | ICD-10-CM | POA: Diagnosis not present

## 2020-01-07 DIAGNOSIS — R6511 Systemic inflammatory response syndrome (SIRS) of non-infectious origin with acute organ dysfunction: Secondary | ICD-10-CM | POA: Diagnosis not present

## 2020-01-07 DIAGNOSIS — J9612 Chronic respiratory failure with hypercapnia: Secondary | ICD-10-CM | POA: Diagnosis not present

## 2020-01-07 DIAGNOSIS — R0602 Shortness of breath: Secondary | ICD-10-CM | POA: Diagnosis not present

## 2020-01-07 DIAGNOSIS — J9611 Chronic respiratory failure with hypoxia: Secondary | ICD-10-CM | POA: Diagnosis not present

## 2020-01-07 DIAGNOSIS — F329 Major depressive disorder, single episode, unspecified: Secondary | ICD-10-CM | POA: Diagnosis not present

## 2020-01-07 DIAGNOSIS — J449 Chronic obstructive pulmonary disease, unspecified: Secondary | ICD-10-CM | POA: Diagnosis not present

## 2020-01-07 DIAGNOSIS — Z7951 Long term (current) use of inhaled steroids: Secondary | ICD-10-CM | POA: Diagnosis not present

## 2020-01-07 DIAGNOSIS — J9622 Acute and chronic respiratory failure with hypercapnia: Secondary | ICD-10-CM | POA: Diagnosis not present

## 2020-01-07 DIAGNOSIS — J9621 Acute and chronic respiratory failure with hypoxia: Secondary | ICD-10-CM | POA: Diagnosis not present

## 2020-01-07 DIAGNOSIS — I10 Essential (primary) hypertension: Secondary | ICD-10-CM | POA: Diagnosis not present

## 2020-01-07 DIAGNOSIS — G629 Polyneuropathy, unspecified: Secondary | ICD-10-CM | POA: Diagnosis not present

## 2020-01-07 DIAGNOSIS — J9601 Acute respiratory failure with hypoxia: Secondary | ICD-10-CM | POA: Diagnosis not present

## 2020-01-15 DIAGNOSIS — I10 Essential (primary) hypertension: Secondary | ICD-10-CM | POA: Diagnosis not present

## 2020-01-15 DIAGNOSIS — J441 Chronic obstructive pulmonary disease with (acute) exacerbation: Secondary | ICD-10-CM | POA: Diagnosis not present

## 2020-01-15 DIAGNOSIS — J449 Chronic obstructive pulmonary disease, unspecified: Secondary | ICD-10-CM | POA: Diagnosis not present

## 2020-01-15 DIAGNOSIS — Z299 Encounter for prophylactic measures, unspecified: Secondary | ICD-10-CM | POA: Diagnosis not present

## 2020-01-17 DIAGNOSIS — J449 Chronic obstructive pulmonary disease, unspecified: Secondary | ICD-10-CM | POA: Diagnosis not present

## 2020-01-17 DIAGNOSIS — J9610 Chronic respiratory failure, unspecified whether with hypoxia or hypercapnia: Secondary | ICD-10-CM | POA: Diagnosis not present

## 2020-01-25 DIAGNOSIS — J449 Chronic obstructive pulmonary disease, unspecified: Secondary | ICD-10-CM | POA: Diagnosis not present

## 2020-01-25 DIAGNOSIS — Z299 Encounter for prophylactic measures, unspecified: Secondary | ICD-10-CM | POA: Diagnosis not present

## 2020-01-25 DIAGNOSIS — I10 Essential (primary) hypertension: Secondary | ICD-10-CM | POA: Diagnosis not present

## 2020-01-25 DIAGNOSIS — I2510 Atherosclerotic heart disease of native coronary artery without angina pectoris: Secondary | ICD-10-CM | POA: Diagnosis not present

## 2020-02-01 DIAGNOSIS — M4854XG Collapsed vertebra, not elsewhere classified, thoracic region, subsequent encounter for fracture with delayed healing: Secondary | ICD-10-CM | POA: Diagnosis not present

## 2020-02-01 DIAGNOSIS — G8929 Other chronic pain: Secondary | ICD-10-CM | POA: Diagnosis not present

## 2020-02-01 DIAGNOSIS — G894 Chronic pain syndrome: Secondary | ICD-10-CM | POA: Diagnosis not present

## 2020-02-01 DIAGNOSIS — M810 Age-related osteoporosis without current pathological fracture: Secondary | ICD-10-CM | POA: Diagnosis not present

## 2020-02-01 DIAGNOSIS — M542 Cervicalgia: Secondary | ICD-10-CM | POA: Diagnosis not present

## 2020-02-01 DIAGNOSIS — M545 Low back pain: Secondary | ICD-10-CM | POA: Diagnosis not present

## 2020-02-01 DIAGNOSIS — M940 Chondrocostal junction syndrome [Tietze]: Secondary | ICD-10-CM | POA: Diagnosis not present

## 2020-02-01 DIAGNOSIS — M797 Fibromyalgia: Secondary | ICD-10-CM | POA: Diagnosis not present

## 2020-02-01 DIAGNOSIS — M4856XG Collapsed vertebra, not elsewhere classified, lumbar region, subsequent encounter for fracture with delayed healing: Secondary | ICD-10-CM | POA: Diagnosis not present

## 2020-02-02 DIAGNOSIS — N893 Dysplasia of vagina, unspecified: Secondary | ICD-10-CM | POA: Diagnosis not present

## 2020-02-02 DIAGNOSIS — R87613 High grade squamous intraepithelial lesion on cytologic smear of cervix (HGSIL): Secondary | ICD-10-CM | POA: Diagnosis not present

## 2020-02-02 DIAGNOSIS — K629 Disease of anus and rectum, unspecified: Secondary | ICD-10-CM | POA: Diagnosis not present

## 2020-02-02 DIAGNOSIS — Z9981 Dependence on supplemental oxygen: Secondary | ICD-10-CM | POA: Diagnosis not present

## 2020-02-02 DIAGNOSIS — R140 Abdominal distension (gaseous): Secondary | ICD-10-CM | POA: Diagnosis not present

## 2020-02-02 DIAGNOSIS — J449 Chronic obstructive pulmonary disease, unspecified: Secondary | ICD-10-CM | POA: Diagnosis not present

## 2020-02-09 ENCOUNTER — Encounter (HOSPITAL_COMMUNITY): Payer: Self-pay | Admitting: Psychiatry

## 2020-02-09 ENCOUNTER — Other Ambulatory Visit: Payer: Self-pay

## 2020-02-09 ENCOUNTER — Telehealth (INDEPENDENT_AMBULATORY_CARE_PROVIDER_SITE_OTHER): Payer: Medicare PPO | Admitting: Psychiatry

## 2020-02-09 DIAGNOSIS — F322 Major depressive disorder, single episode, severe without psychotic features: Secondary | ICD-10-CM

## 2020-02-09 MED ORDER — SERTRALINE HCL 100 MG PO TABS: 100.0000 mg | ORAL_TABLET | Freq: Every day | ORAL | 2 refills | Status: DC

## 2020-02-09 MED ORDER — ALPRAZOLAM 1 MG PO TABS: 1.0000 mg | ORAL_TABLET | Freq: Three times a day (TID) | ORAL | 2 refills | Status: DC

## 2020-02-09 MED ORDER — MIRTAZAPINE 15 MG PO TABS: 15.0000 mg | ORAL_TABLET | Freq: Every day | ORAL | 2 refills | Status: DC
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
## 2020-02-14 DIAGNOSIS — J449 Chronic obstructive pulmonary disease, unspecified: Secondary | ICD-10-CM | POA: Diagnosis not present

## 2020-02-16 DIAGNOSIS — J9610 Chronic respiratory failure, unspecified whether with hypoxia or hypercapnia: Secondary | ICD-10-CM | POA: Diagnosis not present

## 2020-02-16 DIAGNOSIS — J449 Chronic obstructive pulmonary disease, unspecified: Secondary | ICD-10-CM | POA: Diagnosis not present

## 2020-02-22 DIAGNOSIS — R140 Abdominal distension (gaseous): Secondary | ICD-10-CM | POA: Diagnosis not present

## 2020-02-22 DIAGNOSIS — I700 Atherosclerosis of aorta: Secondary | ICD-10-CM | POA: Diagnosis not present

## 2020-02-24 DIAGNOSIS — J441 Chronic obstructive pulmonary disease with (acute) exacerbation: Secondary | ICD-10-CM | POA: Diagnosis not present

## 2020-02-24 DIAGNOSIS — I10 Essential (primary) hypertension: Secondary | ICD-10-CM | POA: Diagnosis not present

## 2020-02-24 DIAGNOSIS — J449 Chronic obstructive pulmonary disease, unspecified: Secondary | ICD-10-CM | POA: Diagnosis not present

## 2020-02-24 DIAGNOSIS — Z299 Encounter for prophylactic measures, unspecified: Secondary | ICD-10-CM | POA: Diagnosis not present

## 2020-02-24 DIAGNOSIS — F339 Major depressive disorder, recurrent, unspecified: Secondary | ICD-10-CM | POA: Diagnosis not present

## 2020-03-04 DIAGNOSIS — Z01818 Encounter for other preprocedural examination: Secondary | ICD-10-CM | POA: Diagnosis not present

## 2020-03-07 DIAGNOSIS — F1320 Sedative, hypnotic or anxiolytic dependence, uncomplicated: Secondary | ICD-10-CM | POA: Diagnosis not present

## 2020-03-07 DIAGNOSIS — R069 Unspecified abnormalities of breathing: Secondary | ICD-10-CM | POA: Diagnosis not present

## 2020-03-07 DIAGNOSIS — J440 Chronic obstructive pulmonary disease with acute lower respiratory infection: Secondary | ICD-10-CM | POA: Diagnosis not present

## 2020-03-07 DIAGNOSIS — I10 Essential (primary) hypertension: Secondary | ICD-10-CM | POA: Diagnosis not present

## 2020-03-07 DIAGNOSIS — R0902 Hypoxemia: Secondary | ICD-10-CM | POA: Diagnosis not present

## 2020-03-07 DIAGNOSIS — J9622 Acute and chronic respiratory failure with hypercapnia: Secondary | ICD-10-CM | POA: Diagnosis not present

## 2020-03-07 DIAGNOSIS — F1120 Opioid dependence, uncomplicated: Secondary | ICD-10-CM | POA: Diagnosis not present

## 2020-03-07 DIAGNOSIS — F419 Anxiety disorder, unspecified: Secondary | ICD-10-CM | POA: Diagnosis not present

## 2020-03-07 DIAGNOSIS — Z20822 Contact with and (suspected) exposure to covid-19: Secondary | ICD-10-CM | POA: Diagnosis not present

## 2020-03-07 DIAGNOSIS — J441 Chronic obstructive pulmonary disease with (acute) exacerbation: Secondary | ICD-10-CM | POA: Diagnosis not present

## 2020-03-07 DIAGNOSIS — J189 Pneumonia, unspecified organism: Secondary | ICD-10-CM | POA: Diagnosis not present

## 2020-03-07 DIAGNOSIS — R0602 Shortness of breath: Secondary | ICD-10-CM | POA: Diagnosis not present

## 2020-03-07 DIAGNOSIS — J9601 Acute respiratory failure with hypoxia: Secondary | ICD-10-CM | POA: Diagnosis not present

## 2020-03-07 DIAGNOSIS — D649 Anemia, unspecified: Secondary | ICD-10-CM | POA: Diagnosis not present

## 2020-03-07 DIAGNOSIS — J9621 Acute and chronic respiratory failure with hypoxia: Secondary | ICD-10-CM | POA: Diagnosis not present

## 2020-03-07 DIAGNOSIS — J9602 Acute respiratory failure with hypercapnia: Secondary | ICD-10-CM | POA: Diagnosis not present

## 2020-03-11 ENCOUNTER — Telehealth (HOSPITAL_COMMUNITY): Payer: Medicare PPO | Admitting: Psychiatry

## 2020-03-15 DIAGNOSIS — J9612 Chronic respiratory failure with hypercapnia: Secondary | ICD-10-CM | POA: Diagnosis not present

## 2020-03-15 DIAGNOSIS — J441 Chronic obstructive pulmonary disease with (acute) exacerbation: Secondary | ICD-10-CM | POA: Diagnosis not present

## 2020-03-15 DIAGNOSIS — I2510 Atherosclerotic heart disease of native coronary artery without angina pectoris: Secondary | ICD-10-CM | POA: Diagnosis not present

## 2020-03-15 DIAGNOSIS — J9611 Chronic respiratory failure with hypoxia: Secondary | ICD-10-CM | POA: Diagnosis not present

## 2020-03-16 DIAGNOSIS — J449 Chronic obstructive pulmonary disease, unspecified: Secondary | ICD-10-CM | POA: Diagnosis not present

## 2020-03-18 DIAGNOSIS — J9610 Chronic respiratory failure, unspecified whether with hypoxia or hypercapnia: Secondary | ICD-10-CM | POA: Diagnosis not present

## 2020-03-18 DIAGNOSIS — J449 Chronic obstructive pulmonary disease, unspecified: Secondary | ICD-10-CM | POA: Diagnosis not present

## 2020-03-24 DIAGNOSIS — M810 Age-related osteoporosis without current pathological fracture: Secondary | ICD-10-CM | POA: Diagnosis not present

## 2020-03-24 DIAGNOSIS — J9611 Chronic respiratory failure with hypoxia: Secondary | ICD-10-CM | POA: Diagnosis not present

## 2020-03-24 DIAGNOSIS — Z299 Encounter for prophylactic measures, unspecified: Secondary | ICD-10-CM | POA: Diagnosis not present

## 2020-03-24 DIAGNOSIS — S81811A Laceration without foreign body, right lower leg, initial encounter: Secondary | ICD-10-CM | POA: Diagnosis not present

## 2020-03-24 DIAGNOSIS — F419 Anxiety disorder, unspecified: Secondary | ICD-10-CM | POA: Diagnosis not present

## 2020-03-24 DIAGNOSIS — F339 Major depressive disorder, recurrent, unspecified: Secondary | ICD-10-CM | POA: Diagnosis not present

## 2020-03-24 DIAGNOSIS — I10 Essential (primary) hypertension: Secondary | ICD-10-CM | POA: Diagnosis not present

## 2020-03-29 DIAGNOSIS — F339 Major depressive disorder, recurrent, unspecified: Secondary | ICD-10-CM | POA: Diagnosis not present

## 2020-03-29 DIAGNOSIS — J449 Chronic obstructive pulmonary disease, unspecified: Secondary | ICD-10-CM | POA: Diagnosis not present

## 2020-03-29 DIAGNOSIS — K219 Gastro-esophageal reflux disease without esophagitis: Secondary | ICD-10-CM | POA: Diagnosis not present

## 2020-03-29 DIAGNOSIS — Z299 Encounter for prophylactic measures, unspecified: Secondary | ICD-10-CM | POA: Diagnosis not present

## 2020-03-31 DIAGNOSIS — M549 Dorsalgia, unspecified: Secondary | ICD-10-CM | POA: Diagnosis not present

## 2020-03-31 DIAGNOSIS — I10 Essential (primary) hypertension: Secondary | ICD-10-CM | POA: Diagnosis not present

## 2020-03-31 DIAGNOSIS — Z20822 Contact with and (suspected) exposure to covid-19: Secondary | ICD-10-CM | POA: Diagnosis not present

## 2020-03-31 DIAGNOSIS — J440 Chronic obstructive pulmonary disease with acute lower respiratory infection: Secondary | ICD-10-CM | POA: Diagnosis not present

## 2020-03-31 DIAGNOSIS — J9612 Chronic respiratory failure with hypercapnia: Secondary | ICD-10-CM | POA: Diagnosis not present

## 2020-03-31 DIAGNOSIS — E875 Hyperkalemia: Secondary | ICD-10-CM | POA: Diagnosis not present

## 2020-03-31 DIAGNOSIS — J9601 Acute respiratory failure with hypoxia: Secondary | ICD-10-CM | POA: Diagnosis not present

## 2020-03-31 DIAGNOSIS — J441 Chronic obstructive pulmonary disease with (acute) exacerbation: Secondary | ICD-10-CM | POA: Diagnosis not present

## 2020-03-31 DIAGNOSIS — A419 Sepsis, unspecified organism: Secondary | ICD-10-CM | POA: Diagnosis not present

## 2020-03-31 DIAGNOSIS — E871 Hypo-osmolality and hyponatremia: Secondary | ICD-10-CM | POA: Diagnosis not present

## 2020-03-31 DIAGNOSIS — G4731 Primary central sleep apnea: Secondary | ICD-10-CM | POA: Diagnosis not present

## 2020-03-31 DIAGNOSIS — R7889 Finding of other specified substances, not normally found in blood: Secondary | ICD-10-CM | POA: Diagnosis not present

## 2020-03-31 DIAGNOSIS — J9621 Acute and chronic respiratory failure with hypoxia: Secondary | ICD-10-CM | POA: Diagnosis not present

## 2020-03-31 DIAGNOSIS — J9622 Acute and chronic respiratory failure with hypercapnia: Secondary | ICD-10-CM | POA: Diagnosis not present

## 2020-03-31 DIAGNOSIS — J9610 Chronic respiratory failure, unspecified whether with hypoxia or hypercapnia: Secondary | ICD-10-CM | POA: Diagnosis not present

## 2020-03-31 DIAGNOSIS — J449 Chronic obstructive pulmonary disease, unspecified: Secondary | ICD-10-CM | POA: Diagnosis not present

## 2020-03-31 DIAGNOSIS — J209 Acute bronchitis, unspecified: Secondary | ICD-10-CM | POA: Diagnosis not present

## 2020-03-31 DIAGNOSIS — G4733 Obstructive sleep apnea (adult) (pediatric): Secondary | ICD-10-CM | POA: Diagnosis not present

## 2020-03-31 DIAGNOSIS — Z9911 Dependence on respirator [ventilator] status: Secondary | ICD-10-CM | POA: Diagnosis not present

## 2020-03-31 DIAGNOSIS — J9611 Chronic respiratory failure with hypoxia: Secondary | ICD-10-CM | POA: Diagnosis not present

## 2020-03-31 DIAGNOSIS — F1120 Opioid dependence, uncomplicated: Secondary | ICD-10-CM | POA: Diagnosis not present

## 2020-04-01 ENCOUNTER — Other Ambulatory Visit: Payer: Self-pay

## 2020-04-01 ENCOUNTER — Telehealth (HOSPITAL_COMMUNITY): Payer: Medicare PPO | Admitting: Psychiatry

## 2020-04-07 ENCOUNTER — Encounter (HOSPITAL_COMMUNITY): Payer: Self-pay | Admitting: Psychiatry

## 2020-04-07 ENCOUNTER — Other Ambulatory Visit: Payer: Self-pay

## 2020-04-07 ENCOUNTER — Telehealth (INDEPENDENT_AMBULATORY_CARE_PROVIDER_SITE_OTHER): Payer: Medicare PPO | Admitting: Psychiatry

## 2020-04-07 DIAGNOSIS — M810 Age-related osteoporosis without current pathological fracture: Secondary | ICD-10-CM | POA: Diagnosis not present

## 2020-04-07 DIAGNOSIS — M4854XG Collapsed vertebra, not elsewhere classified, thoracic region, subsequent encounter for fracture with delayed healing: Secondary | ICD-10-CM | POA: Diagnosis not present

## 2020-04-07 DIAGNOSIS — F322 Major depressive disorder, single episode, severe without psychotic features: Secondary | ICD-10-CM

## 2020-04-07 DIAGNOSIS — M4856XG Collapsed vertebra, not elsewhere classified, lumbar region, subsequent encounter for fracture with delayed healing: Secondary | ICD-10-CM | POA: Diagnosis not present

## 2020-04-07 DIAGNOSIS — M545 Low back pain: Secondary | ICD-10-CM | POA: Diagnosis not present

## 2020-04-07 DIAGNOSIS — G8929 Other chronic pain: Secondary | ICD-10-CM | POA: Diagnosis not present

## 2020-04-07 DIAGNOSIS — M797 Fibromyalgia: Secondary | ICD-10-CM | POA: Diagnosis not present

## 2020-04-07 DIAGNOSIS — M542 Cervicalgia: Secondary | ICD-10-CM | POA: Diagnosis not present

## 2020-04-07 DIAGNOSIS — M940 Chondrocostal junction syndrome [Tietze]: Secondary | ICD-10-CM | POA: Diagnosis not present

## 2020-04-07 DIAGNOSIS — G894 Chronic pain syndrome: Secondary | ICD-10-CM | POA: Diagnosis not present

## 2020-04-07 MED ORDER — SERTRALINE HCL 100 MG PO TABS: 100.0000 mg | ORAL_TABLET | Freq: Every day | ORAL | 2 refills | Status: AC

## 2020-04-07 MED ORDER — MIRTAZAPINE 15 MG PO TABS: 15.0000 mg | ORAL_TABLET | Freq: Every day | ORAL | 2 refills | Status: AC

## 2020-04-07 MED ORDER — ALPRAZOLAM 1 MG PO TABS: 1.0000 mg | ORAL_TABLET | Freq: Three times a day (TID) | ORAL | 2 refills | Status: AC
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
## 2020-04-08 DIAGNOSIS — K219 Gastro-esophageal reflux disease without esophagitis: Secondary | ICD-10-CM | POA: Diagnosis not present

## 2020-04-08 DIAGNOSIS — R531 Weakness: Secondary | ICD-10-CM | POA: Diagnosis not present

## 2020-04-08 DIAGNOSIS — J441 Chronic obstructive pulmonary disease with (acute) exacerbation: Secondary | ICD-10-CM | POA: Diagnosis not present

## 2020-04-08 DIAGNOSIS — M6281 Muscle weakness (generalized): Secondary | ICD-10-CM | POA: Diagnosis not present

## 2020-04-08 DIAGNOSIS — J9602 Acute respiratory failure with hypercapnia: Secondary | ICD-10-CM | POA: Diagnosis not present

## 2020-04-08 DIAGNOSIS — Z7401 Bed confinement status: Secondary | ICD-10-CM | POA: Diagnosis not present

## 2020-04-08 DIAGNOSIS — E46 Unspecified protein-calorie malnutrition: Secondary | ICD-10-CM | POA: Diagnosis not present

## 2020-04-08 DIAGNOSIS — F339 Major depressive disorder, recurrent, unspecified: Secondary | ICD-10-CM | POA: Diagnosis not present

## 2020-04-08 DIAGNOSIS — G8929 Other chronic pain: Secondary | ICD-10-CM | POA: Diagnosis not present

## 2020-04-08 DIAGNOSIS — E785 Hyperlipidemia, unspecified: Secondary | ICD-10-CM | POA: Diagnosis not present

## 2020-04-08 DIAGNOSIS — J449 Chronic obstructive pulmonary disease, unspecified: Secondary | ICD-10-CM | POA: Diagnosis not present

## 2020-04-08 DIAGNOSIS — J189 Pneumonia, unspecified organism: Secondary | ICD-10-CM | POA: Diagnosis not present

## 2020-04-08 DIAGNOSIS — I10 Essential (primary) hypertension: Secondary | ICD-10-CM | POA: Diagnosis not present

## 2020-04-08 DIAGNOSIS — J9622 Acute and chronic respiratory failure with hypercapnia: Secondary | ICD-10-CM | POA: Diagnosis not present

## 2020-04-08 DIAGNOSIS — R0602 Shortness of breath: Secondary | ICD-10-CM | POA: Diagnosis not present

## 2020-04-08 DIAGNOSIS — M545 Low back pain: Secondary | ICD-10-CM | POA: Diagnosis not present

## 2020-04-08 DIAGNOSIS — R410 Disorientation, unspecified: Secondary | ICD-10-CM | POA: Diagnosis not present

## 2020-04-08 DIAGNOSIS — M797 Fibromyalgia: Secondary | ICD-10-CM | POA: Diagnosis not present

## 2020-04-08 DIAGNOSIS — R52 Pain, unspecified: Secondary | ICD-10-CM | POA: Diagnosis not present

## 2020-04-08 DIAGNOSIS — F1320 Sedative, hypnotic or anxiolytic dependence, uncomplicated: Secondary | ICD-10-CM | POA: Diagnosis not present

## 2020-04-08 DIAGNOSIS — M5489 Other dorsalgia: Secondary | ICD-10-CM | POA: Diagnosis not present

## 2020-04-08 DIAGNOSIS — R4182 Altered mental status, unspecified: Secondary | ICD-10-CM | POA: Diagnosis not present

## 2020-04-08 DIAGNOSIS — M940 Chondrocostal junction syndrome [Tietze]: Secondary | ICD-10-CM | POA: Diagnosis not present

## 2020-04-08 DIAGNOSIS — Z7952 Long term (current) use of systemic steroids: Secondary | ICD-10-CM | POA: Diagnosis not present

## 2020-04-08 DIAGNOSIS — J9621 Acute and chronic respiratory failure with hypoxia: Secondary | ICD-10-CM | POA: Diagnosis not present

## 2020-04-08 DIAGNOSIS — F1120 Opioid dependence, uncomplicated: Secondary | ICD-10-CM | POA: Diagnosis not present

## 2020-04-14 DIAGNOSIS — E785 Hyperlipidemia, unspecified: Secondary | ICD-10-CM | POA: Diagnosis not present

## 2020-04-14 DIAGNOSIS — J189 Pneumonia, unspecified organism: Secondary | ICD-10-CM | POA: Diagnosis not present

## 2020-04-14 DIAGNOSIS — M2681 Anterior soft tissue impingement: Secondary | ICD-10-CM | POA: Diagnosis not present

## 2020-04-14 DIAGNOSIS — Z7401 Bed confinement status: Secondary | ICD-10-CM | POA: Diagnosis not present

## 2020-04-14 DIAGNOSIS — J9622 Acute and chronic respiratory failure with hypercapnia: Secondary | ICD-10-CM | POA: Diagnosis not present

## 2020-04-14 DIAGNOSIS — K219 Gastro-esophageal reflux disease without esophagitis: Secondary | ICD-10-CM | POA: Diagnosis not present

## 2020-04-14 DIAGNOSIS — M940 Chondrocostal junction syndrome [Tietze]: Secondary | ICD-10-CM | POA: Diagnosis not present

## 2020-04-14 DIAGNOSIS — D692 Other nonthrombocytopenic purpura: Secondary | ICD-10-CM | POA: Diagnosis not present

## 2020-04-14 DIAGNOSIS — J441 Chronic obstructive pulmonary disease with (acute) exacerbation: Secondary | ICD-10-CM | POA: Diagnosis not present

## 2020-04-14 DIAGNOSIS — M6281 Muscle weakness (generalized): Secondary | ICD-10-CM | POA: Diagnosis not present

## 2020-04-14 DIAGNOSIS — G4733 Obstructive sleep apnea (adult) (pediatric): Secondary | ICD-10-CM | POA: Diagnosis not present

## 2020-04-14 DIAGNOSIS — I10 Essential (primary) hypertension: Secondary | ICD-10-CM | POA: Diagnosis not present

## 2020-04-14 DIAGNOSIS — J9611 Chronic respiratory failure with hypoxia: Secondary | ICD-10-CM | POA: Diagnosis not present

## 2020-04-14 DIAGNOSIS — R4182 Altered mental status, unspecified: Secondary | ICD-10-CM | POA: Diagnosis not present

## 2020-04-14 DIAGNOSIS — R0602 Shortness of breath: Secondary | ICD-10-CM | POA: Diagnosis not present

## 2020-04-14 DIAGNOSIS — J449 Chronic obstructive pulmonary disease, unspecified: Secondary | ICD-10-CM | POA: Diagnosis not present

## 2020-04-14 DIAGNOSIS — K649 Unspecified hemorrhoids: Secondary | ICD-10-CM | POA: Diagnosis not present

## 2020-04-14 DIAGNOSIS — J9621 Acute and chronic respiratory failure with hypoxia: Secondary | ICD-10-CM | POA: Diagnosis not present

## 2020-04-14 DIAGNOSIS — E46 Unspecified protein-calorie malnutrition: Secondary | ICD-10-CM | POA: Diagnosis not present

## 2020-04-14 DIAGNOSIS — Z299 Encounter for prophylactic measures, unspecified: Secondary | ICD-10-CM | POA: Diagnosis not present

## 2020-04-14 DIAGNOSIS — G4731 Primary central sleep apnea: Secondary | ICD-10-CM | POA: Diagnosis not present

## 2020-04-14 DIAGNOSIS — J9610 Chronic respiratory failure, unspecified whether with hypoxia or hypercapnia: Secondary | ICD-10-CM | POA: Diagnosis not present

## 2020-04-14 DIAGNOSIS — F339 Major depressive disorder, recurrent, unspecified: Secondary | ICD-10-CM | POA: Diagnosis not present

## 2020-04-15 ENCOUNTER — Other Ambulatory Visit (HOSPITAL_COMMUNITY): Payer: Self-pay | Admitting: Psychiatry

## 2020-04-18 DIAGNOSIS — K219 Gastro-esophageal reflux disease without esophagitis: Secondary | ICD-10-CM | POA: Diagnosis not present

## 2020-04-18 DIAGNOSIS — F339 Major depressive disorder, recurrent, unspecified: Secondary | ICD-10-CM | POA: Diagnosis not present

## 2020-04-18 DIAGNOSIS — M6281 Muscle weakness (generalized): Secondary | ICD-10-CM | POA: Diagnosis not present

## 2020-04-18 DIAGNOSIS — J449 Chronic obstructive pulmonary disease, unspecified: Secondary | ICD-10-CM | POA: Diagnosis not present

## 2020-04-20 DIAGNOSIS — I10 Essential (primary) hypertension: Secondary | ICD-10-CM | POA: Diagnosis not present

## 2020-04-20 DIAGNOSIS — K219 Gastro-esophageal reflux disease without esophagitis: Secondary | ICD-10-CM | POA: Diagnosis not present

## 2020-04-20 DIAGNOSIS — J449 Chronic obstructive pulmonary disease, unspecified: Secondary | ICD-10-CM | POA: Diagnosis not present

## 2020-04-25 DIAGNOSIS — K219 Gastro-esophageal reflux disease without esophagitis: Secondary | ICD-10-CM | POA: Diagnosis not present

## 2020-04-25 DIAGNOSIS — K649 Unspecified hemorrhoids: Secondary | ICD-10-CM | POA: Diagnosis not present

## 2020-04-25 DIAGNOSIS — M2681 Anterior soft tissue impingement: Secondary | ICD-10-CM | POA: Diagnosis not present

## 2020-04-25 DIAGNOSIS — J449 Chronic obstructive pulmonary disease, unspecified: Secondary | ICD-10-CM | POA: Diagnosis not present

## 2020-05-06 DIAGNOSIS — Z299 Encounter for prophylactic measures, unspecified: Secondary | ICD-10-CM | POA: Diagnosis not present

## 2020-05-06 DIAGNOSIS — D692 Other nonthrombocytopenic purpura: Secondary | ICD-10-CM | POA: Diagnosis not present

## 2020-05-06 DIAGNOSIS — M6281 Muscle weakness (generalized): Secondary | ICD-10-CM | POA: Diagnosis not present

## 2020-05-06 DIAGNOSIS — E46 Unspecified protein-calorie malnutrition: Secondary | ICD-10-CM | POA: Diagnosis not present

## 2020-05-06 DIAGNOSIS — J9611 Chronic respiratory failure with hypoxia: Secondary | ICD-10-CM | POA: Diagnosis not present

## 2020-05-06 DIAGNOSIS — K219 Gastro-esophageal reflux disease without esophagitis: Secondary | ICD-10-CM | POA: Diagnosis not present

## 2020-05-06 DIAGNOSIS — F339 Major depressive disorder, recurrent, unspecified: Secondary | ICD-10-CM | POA: Diagnosis not present

## 2020-05-06 DIAGNOSIS — J449 Chronic obstructive pulmonary disease, unspecified: Secondary | ICD-10-CM | POA: Diagnosis not present

## 2020-05-07 DIAGNOSIS — J449 Chronic obstructive pulmonary disease, unspecified: Secondary | ICD-10-CM | POA: Diagnosis not present

## 2020-05-07 DIAGNOSIS — G8929 Other chronic pain: Secondary | ICD-10-CM | POA: Diagnosis not present

## 2020-05-07 DIAGNOSIS — I10 Essential (primary) hypertension: Secondary | ICD-10-CM | POA: Diagnosis not present

## 2020-05-07 DIAGNOSIS — J9611 Chronic respiratory failure with hypoxia: Secondary | ICD-10-CM | POA: Diagnosis not present

## 2020-05-07 DIAGNOSIS — E46 Unspecified protein-calorie malnutrition: Secondary | ICD-10-CM | POA: Diagnosis not present

## 2020-05-07 DIAGNOSIS — M6281 Muscle weakness (generalized): Secondary | ICD-10-CM | POA: Diagnosis not present

## 2020-05-09 DIAGNOSIS — S81011A Laceration without foreign body, right knee, initial encounter: Secondary | ICD-10-CM | POA: Diagnosis not present

## 2020-05-09 DIAGNOSIS — T8189XD Other complications of procedures, not elsewhere classified, subsequent encounter: Secondary | ICD-10-CM | POA: Diagnosis not present

## 2020-05-09 DIAGNOSIS — Z87891 Personal history of nicotine dependence: Secondary | ICD-10-CM | POA: Diagnosis not present

## 2020-05-10 DIAGNOSIS — J441 Chronic obstructive pulmonary disease with (acute) exacerbation: Secondary | ICD-10-CM | POA: Diagnosis not present

## 2020-05-10 DIAGNOSIS — J440 Chronic obstructive pulmonary disease with acute lower respiratory infection: Secondary | ICD-10-CM | POA: Diagnosis not present

## 2020-05-10 DIAGNOSIS — J9611 Chronic respiratory failure with hypoxia: Secondary | ICD-10-CM | POA: Diagnosis not present

## 2020-05-10 DIAGNOSIS — G8929 Other chronic pain: Secondary | ICD-10-CM | POA: Diagnosis not present

## 2020-05-10 DIAGNOSIS — J188 Other pneumonia, unspecified organism: Secondary | ICD-10-CM | POA: Diagnosis not present

## 2020-05-10 DIAGNOSIS — J449 Chronic obstructive pulmonary disease, unspecified: Secondary | ICD-10-CM | POA: Diagnosis not present

## 2020-05-10 DIAGNOSIS — J9621 Acute and chronic respiratory failure with hypoxia: Secondary | ICD-10-CM | POA: Diagnosis not present

## 2020-05-10 DIAGNOSIS — J9622 Acute and chronic respiratory failure with hypercapnia: Secondary | ICD-10-CM | POA: Diagnosis not present

## 2020-05-10 DIAGNOSIS — M6281 Muscle weakness (generalized): Secondary | ICD-10-CM | POA: Diagnosis not present

## 2020-05-10 DIAGNOSIS — I2510 Atherosclerotic heart disease of native coronary artery without angina pectoris: Secondary | ICD-10-CM | POA: Diagnosis not present

## 2020-05-13 DIAGNOSIS — J441 Chronic obstructive pulmonary disease with (acute) exacerbation: Secondary | ICD-10-CM | POA: Diagnosis not present

## 2020-05-13 DIAGNOSIS — J440 Chronic obstructive pulmonary disease with acute lower respiratory infection: Secondary | ICD-10-CM | POA: Diagnosis not present

## 2020-05-13 DIAGNOSIS — J9621 Acute and chronic respiratory failure with hypoxia: Secondary | ICD-10-CM | POA: Diagnosis not present

## 2020-05-13 DIAGNOSIS — I2510 Atherosclerotic heart disease of native coronary artery without angina pectoris: Secondary | ICD-10-CM | POA: Diagnosis not present

## 2020-05-13 DIAGNOSIS — J9622 Acute and chronic respiratory failure with hypercapnia: Secondary | ICD-10-CM | POA: Diagnosis not present

## 2020-05-13 DIAGNOSIS — J188 Other pneumonia, unspecified organism: Secondary | ICD-10-CM | POA: Diagnosis not present

## 2020-05-16 DIAGNOSIS — J449 Chronic obstructive pulmonary disease, unspecified: Secondary | ICD-10-CM | POA: Diagnosis not present

## 2020-05-17 DIAGNOSIS — Z87891 Personal history of nicotine dependence: Secondary | ICD-10-CM | POA: Diagnosis not present

## 2020-05-17 DIAGNOSIS — J440 Chronic obstructive pulmonary disease with acute lower respiratory infection: Secondary | ICD-10-CM | POA: Diagnosis not present

## 2020-05-17 DIAGNOSIS — J9622 Acute and chronic respiratory failure with hypercapnia: Secondary | ICD-10-CM | POA: Diagnosis not present

## 2020-05-17 DIAGNOSIS — I2510 Atherosclerotic heart disease of native coronary artery without angina pectoris: Secondary | ICD-10-CM | POA: Diagnosis not present

## 2020-05-17 DIAGNOSIS — J188 Other pneumonia, unspecified organism: Secondary | ICD-10-CM | POA: Diagnosis not present

## 2020-05-17 DIAGNOSIS — J449 Chronic obstructive pulmonary disease, unspecified: Secondary | ICD-10-CM | POA: Diagnosis not present

## 2020-05-17 DIAGNOSIS — R5381 Other malaise: Secondary | ICD-10-CM | POA: Diagnosis not present

## 2020-05-17 DIAGNOSIS — J9610 Chronic respiratory failure, unspecified whether with hypoxia or hypercapnia: Secondary | ICD-10-CM | POA: Diagnosis not present

## 2020-05-17 DIAGNOSIS — J441 Chronic obstructive pulmonary disease with (acute) exacerbation: Secondary | ICD-10-CM | POA: Diagnosis not present

## 2020-05-17 DIAGNOSIS — J9621 Acute and chronic respiratory failure with hypoxia: Secondary | ICD-10-CM | POA: Diagnosis not present

## 2020-05-18 DIAGNOSIS — R062 Wheezing: Secondary | ICD-10-CM | POA: Diagnosis not present

## 2020-05-18 DIAGNOSIS — F329 Major depressive disorder, single episode, unspecified: Secondary | ICD-10-CM | POA: Diagnosis not present

## 2020-05-18 DIAGNOSIS — R069 Unspecified abnormalities of breathing: Secondary | ICD-10-CM | POA: Diagnosis not present

## 2020-05-18 DIAGNOSIS — G4733 Obstructive sleep apnea (adult) (pediatric): Secondary | ICD-10-CM | POA: Diagnosis not present

## 2020-05-18 DIAGNOSIS — R0600 Dyspnea, unspecified: Secondary | ICD-10-CM | POA: Diagnosis not present

## 2020-05-18 DIAGNOSIS — I10 Essential (primary) hypertension: Secondary | ICD-10-CM | POA: Diagnosis not present

## 2020-05-18 DIAGNOSIS — J449 Chronic obstructive pulmonary disease, unspecified: Secondary | ICD-10-CM | POA: Diagnosis not present

## 2020-05-18 DIAGNOSIS — J188 Other pneumonia, unspecified organism: Secondary | ICD-10-CM | POA: Diagnosis not present

## 2020-05-18 DIAGNOSIS — M797 Fibromyalgia: Secondary | ICD-10-CM | POA: Diagnosis not present

## 2020-05-18 DIAGNOSIS — D649 Anemia, unspecified: Secondary | ICD-10-CM | POA: Diagnosis not present

## 2020-05-18 DIAGNOSIS — K219 Gastro-esophageal reflux disease without esophagitis: Secondary | ICD-10-CM | POA: Diagnosis not present

## 2020-05-18 DIAGNOSIS — J441 Chronic obstructive pulmonary disease with (acute) exacerbation: Secondary | ICD-10-CM | POA: Diagnosis not present

## 2020-05-18 DIAGNOSIS — J440 Chronic obstructive pulmonary disease with acute lower respiratory infection: Secondary | ICD-10-CM | POA: Diagnosis not present

## 2020-05-18 DIAGNOSIS — J9621 Acute and chronic respiratory failure with hypoxia: Secondary | ICD-10-CM | POA: Diagnosis not present

## 2020-05-18 DIAGNOSIS — J9622 Acute and chronic respiratory failure with hypercapnia: Secondary | ICD-10-CM | POA: Diagnosis not present

## 2020-05-18 DIAGNOSIS — R0602 Shortness of breath: Secondary | ICD-10-CM | POA: Diagnosis not present

## 2020-05-18 DIAGNOSIS — Z9981 Dependence on supplemental oxygen: Secondary | ICD-10-CM | POA: Diagnosis not present

## 2020-05-18 DIAGNOSIS — J9610 Chronic respiratory failure, unspecified whether with hypoxia or hypercapnia: Secondary | ICD-10-CM | POA: Diagnosis not present

## 2020-05-18 DIAGNOSIS — Z20822 Contact with and (suspected) exposure to covid-19: Secondary | ICD-10-CM | POA: Diagnosis not present

## 2020-05-19 DIAGNOSIS — K219 Gastro-esophageal reflux disease without esophagitis: Secondary | ICD-10-CM | POA: Diagnosis not present

## 2020-05-19 DIAGNOSIS — F329 Major depressive disorder, single episode, unspecified: Secondary | ICD-10-CM | POA: Diagnosis not present

## 2020-05-19 DIAGNOSIS — G4733 Obstructive sleep apnea (adult) (pediatric): Secondary | ICD-10-CM | POA: Diagnosis not present

## 2020-05-19 DIAGNOSIS — Z9981 Dependence on supplemental oxygen: Secondary | ICD-10-CM | POA: Diagnosis not present

## 2020-05-19 DIAGNOSIS — J9611 Chronic respiratory failure with hypoxia: Secondary | ICD-10-CM | POA: Diagnosis not present

## 2020-05-19 DIAGNOSIS — M797 Fibromyalgia: Secondary | ICD-10-CM | POA: Diagnosis not present

## 2020-05-19 DIAGNOSIS — F419 Anxiety disorder, unspecified: Secondary | ICD-10-CM | POA: Diagnosis not present

## 2020-05-19 DIAGNOSIS — I10 Essential (primary) hypertension: Secondary | ICD-10-CM | POA: Diagnosis not present

## 2020-05-19 DIAGNOSIS — J441 Chronic obstructive pulmonary disease with (acute) exacerbation: Secondary | ICD-10-CM | POA: Diagnosis not present

## 2020-05-19 DIAGNOSIS — D649 Anemia, unspecified: Secondary | ICD-10-CM | POA: Diagnosis not present

## 2020-05-20 DIAGNOSIS — J441 Chronic obstructive pulmonary disease with (acute) exacerbation: Secondary | ICD-10-CM | POA: Diagnosis not present

## 2020-05-20 DIAGNOSIS — F329 Major depressive disorder, single episode, unspecified: Secondary | ICD-10-CM | POA: Diagnosis not present

## 2020-05-20 DIAGNOSIS — D649 Anemia, unspecified: Secondary | ICD-10-CM | POA: Diagnosis not present

## 2020-05-20 DIAGNOSIS — M797 Fibromyalgia: Secondary | ICD-10-CM | POA: Diagnosis not present

## 2020-05-20 DIAGNOSIS — J9611 Chronic respiratory failure with hypoxia: Secondary | ICD-10-CM | POA: Diagnosis not present

## 2020-05-20 DIAGNOSIS — I10 Essential (primary) hypertension: Secondary | ICD-10-CM | POA: Diagnosis not present

## 2020-05-20 DIAGNOSIS — G4733 Obstructive sleep apnea (adult) (pediatric): Secondary | ICD-10-CM | POA: Diagnosis not present

## 2020-05-20 DIAGNOSIS — F419 Anxiety disorder, unspecified: Secondary | ICD-10-CM | POA: Diagnosis not present

## 2020-05-20 DIAGNOSIS — Z9981 Dependence on supplemental oxygen: Secondary | ICD-10-CM | POA: Diagnosis not present

## 2020-05-20 DIAGNOSIS — K219 Gastro-esophageal reflux disease without esophagitis: Secondary | ICD-10-CM | POA: Diagnosis not present

## 2020-05-21 DIAGNOSIS — G4733 Obstructive sleep apnea (adult) (pediatric): Secondary | ICD-10-CM | POA: Diagnosis not present

## 2020-05-21 DIAGNOSIS — J441 Chronic obstructive pulmonary disease with (acute) exacerbation: Secondary | ICD-10-CM | POA: Diagnosis not present

## 2020-05-21 DIAGNOSIS — Z9981 Dependence on supplemental oxygen: Secondary | ICD-10-CM | POA: Diagnosis not present

## 2020-05-21 DIAGNOSIS — D649 Anemia, unspecified: Secondary | ICD-10-CM | POA: Diagnosis not present

## 2020-05-21 DIAGNOSIS — D72829 Elevated white blood cell count, unspecified: Secondary | ICD-10-CM | POA: Diagnosis not present

## 2020-05-21 DIAGNOSIS — M797 Fibromyalgia: Secondary | ICD-10-CM | POA: Diagnosis not present

## 2020-05-21 DIAGNOSIS — F329 Major depressive disorder, single episode, unspecified: Secondary | ICD-10-CM | POA: Diagnosis not present

## 2020-05-21 DIAGNOSIS — F419 Anxiety disorder, unspecified: Secondary | ICD-10-CM | POA: Diagnosis not present

## 2020-05-21 DIAGNOSIS — K219 Gastro-esophageal reflux disease without esophagitis: Secondary | ICD-10-CM | POA: Diagnosis not present

## 2020-05-21 DIAGNOSIS — J9611 Chronic respiratory failure with hypoxia: Secondary | ICD-10-CM | POA: Diagnosis not present

## 2020-05-21 DIAGNOSIS — I10 Essential (primary) hypertension: Secondary | ICD-10-CM | POA: Diagnosis not present

## 2020-05-22 DIAGNOSIS — F419 Anxiety disorder, unspecified: Secondary | ICD-10-CM | POA: Diagnosis not present

## 2020-05-22 DIAGNOSIS — D649 Anemia, unspecified: Secondary | ICD-10-CM | POA: Diagnosis not present

## 2020-05-22 DIAGNOSIS — F329 Major depressive disorder, single episode, unspecified: Secondary | ICD-10-CM | POA: Diagnosis not present

## 2020-05-22 DIAGNOSIS — K219 Gastro-esophageal reflux disease without esophagitis: Secondary | ICD-10-CM | POA: Diagnosis not present

## 2020-05-22 DIAGNOSIS — J441 Chronic obstructive pulmonary disease with (acute) exacerbation: Secondary | ICD-10-CM | POA: Diagnosis not present

## 2020-05-22 DIAGNOSIS — I10 Essential (primary) hypertension: Secondary | ICD-10-CM | POA: Diagnosis not present

## 2020-05-22 DIAGNOSIS — Z9981 Dependence on supplemental oxygen: Secondary | ICD-10-CM | POA: Diagnosis not present

## 2020-05-22 DIAGNOSIS — J9611 Chronic respiratory failure with hypoxia: Secondary | ICD-10-CM | POA: Diagnosis not present

## 2020-05-22 DIAGNOSIS — G4733 Obstructive sleep apnea (adult) (pediatric): Secondary | ICD-10-CM | POA: Diagnosis not present

## 2020-05-22 DIAGNOSIS — M797 Fibromyalgia: Secondary | ICD-10-CM | POA: Diagnosis not present

## 2020-05-24 DIAGNOSIS — J188 Other pneumonia, unspecified organism: Secondary | ICD-10-CM | POA: Diagnosis not present

## 2020-05-24 DIAGNOSIS — I2510 Atherosclerotic heart disease of native coronary artery without angina pectoris: Secondary | ICD-10-CM | POA: Diagnosis not present

## 2020-05-24 DIAGNOSIS — J9621 Acute and chronic respiratory failure with hypoxia: Secondary | ICD-10-CM | POA: Diagnosis not present

## 2020-05-24 DIAGNOSIS — J9622 Acute and chronic respiratory failure with hypercapnia: Secondary | ICD-10-CM | POA: Diagnosis not present

## 2020-05-24 DIAGNOSIS — J440 Chronic obstructive pulmonary disease with acute lower respiratory infection: Secondary | ICD-10-CM | POA: Diagnosis not present

## 2020-05-24 DIAGNOSIS — J441 Chronic obstructive pulmonary disease with (acute) exacerbation: Secondary | ICD-10-CM | POA: Diagnosis not present

## 2020-05-25 DIAGNOSIS — Z299 Encounter for prophylactic measures, unspecified: Secondary | ICD-10-CM | POA: Diagnosis not present

## 2020-05-25 DIAGNOSIS — F339 Major depressive disorder, recurrent, unspecified: Secondary | ICD-10-CM | POA: Diagnosis not present

## 2020-05-25 DIAGNOSIS — I10 Essential (primary) hypertension: Secondary | ICD-10-CM | POA: Diagnosis not present

## 2020-05-25 DIAGNOSIS — Z87891 Personal history of nicotine dependence: Secondary | ICD-10-CM | POA: Diagnosis not present

## 2020-05-25 DIAGNOSIS — J449 Chronic obstructive pulmonary disease, unspecified: Secondary | ICD-10-CM | POA: Diagnosis not present

## 2020-05-25 DIAGNOSIS — K219 Gastro-esophageal reflux disease without esophagitis: Secondary | ICD-10-CM | POA: Diagnosis not present

## 2020-05-26 DIAGNOSIS — J9621 Acute and chronic respiratory failure with hypoxia: Secondary | ICD-10-CM | POA: Diagnosis not present

## 2020-05-26 DIAGNOSIS — J441 Chronic obstructive pulmonary disease with (acute) exacerbation: Secondary | ICD-10-CM | POA: Diagnosis not present

## 2020-05-26 DIAGNOSIS — J9622 Acute and chronic respiratory failure with hypercapnia: Secondary | ICD-10-CM | POA: Diagnosis not present

## 2020-05-26 DIAGNOSIS — I2510 Atherosclerotic heart disease of native coronary artery without angina pectoris: Secondary | ICD-10-CM | POA: Diagnosis not present

## 2020-05-26 DIAGNOSIS — J440 Chronic obstructive pulmonary disease with acute lower respiratory infection: Secondary | ICD-10-CM | POA: Diagnosis not present

## 2020-05-26 DIAGNOSIS — J188 Other pneumonia, unspecified organism: Secondary | ICD-10-CM | POA: Diagnosis not present

## 2020-05-28 DIAGNOSIS — J441 Chronic obstructive pulmonary disease with (acute) exacerbation: Secondary | ICD-10-CM | POA: Diagnosis not present

## 2020-05-28 DIAGNOSIS — J9621 Acute and chronic respiratory failure with hypoxia: Secondary | ICD-10-CM | POA: Diagnosis not present

## 2020-05-28 DIAGNOSIS — J440 Chronic obstructive pulmonary disease with acute lower respiratory infection: Secondary | ICD-10-CM | POA: Diagnosis not present

## 2020-05-28 DIAGNOSIS — J188 Other pneumonia, unspecified organism: Secondary | ICD-10-CM | POA: Diagnosis not present

## 2020-05-28 DIAGNOSIS — J9622 Acute and chronic respiratory failure with hypercapnia: Secondary | ICD-10-CM | POA: Diagnosis not present

## 2020-05-28 DIAGNOSIS — I2510 Atherosclerotic heart disease of native coronary artery without angina pectoris: Secondary | ICD-10-CM | POA: Diagnosis not present

## 2020-05-30 DIAGNOSIS — J440 Chronic obstructive pulmonary disease with acute lower respiratory infection: Secondary | ICD-10-CM | POA: Diagnosis not present

## 2020-05-30 DIAGNOSIS — I2510 Atherosclerotic heart disease of native coronary artery without angina pectoris: Secondary | ICD-10-CM | POA: Diagnosis not present

## 2020-05-30 DIAGNOSIS — J188 Other pneumonia, unspecified organism: Secondary | ICD-10-CM | POA: Diagnosis not present

## 2020-05-30 DIAGNOSIS — J441 Chronic obstructive pulmonary disease with (acute) exacerbation: Secondary | ICD-10-CM | POA: Diagnosis not present

## 2020-05-30 DIAGNOSIS — J9621 Acute and chronic respiratory failure with hypoxia: Secondary | ICD-10-CM | POA: Diagnosis not present

## 2020-05-30 DIAGNOSIS — J9622 Acute and chronic respiratory failure with hypercapnia: Secondary | ICD-10-CM | POA: Diagnosis not present

## 2020-05-31 DIAGNOSIS — J441 Chronic obstructive pulmonary disease with (acute) exacerbation: Secondary | ICD-10-CM | POA: Diagnosis not present

## 2020-05-31 DIAGNOSIS — J440 Chronic obstructive pulmonary disease with acute lower respiratory infection: Secondary | ICD-10-CM | POA: Diagnosis not present

## 2020-05-31 DIAGNOSIS — J9621 Acute and chronic respiratory failure with hypoxia: Secondary | ICD-10-CM | POA: Diagnosis not present

## 2020-05-31 DIAGNOSIS — J9622 Acute and chronic respiratory failure with hypercapnia: Secondary | ICD-10-CM | POA: Diagnosis not present

## 2020-05-31 DIAGNOSIS — J188 Other pneumonia, unspecified organism: Secondary | ICD-10-CM | POA: Diagnosis not present

## 2020-05-31 DIAGNOSIS — I2510 Atherosclerotic heart disease of native coronary artery without angina pectoris: Secondary | ICD-10-CM | POA: Diagnosis not present

## 2020-06-02 DIAGNOSIS — I2510 Atherosclerotic heart disease of native coronary artery without angina pectoris: Secondary | ICD-10-CM | POA: Diagnosis not present

## 2020-06-02 DIAGNOSIS — J188 Other pneumonia, unspecified organism: Secondary | ICD-10-CM | POA: Diagnosis not present

## 2020-06-02 DIAGNOSIS — J9622 Acute and chronic respiratory failure with hypercapnia: Secondary | ICD-10-CM | POA: Diagnosis not present

## 2020-06-02 DIAGNOSIS — J9621 Acute and chronic respiratory failure with hypoxia: Secondary | ICD-10-CM | POA: Diagnosis not present

## 2020-06-02 DIAGNOSIS — J441 Chronic obstructive pulmonary disease with (acute) exacerbation: Secondary | ICD-10-CM | POA: Diagnosis not present

## 2020-06-02 DIAGNOSIS — J440 Chronic obstructive pulmonary disease with acute lower respiratory infection: Secondary | ICD-10-CM | POA: Diagnosis not present

## 2020-06-03 DIAGNOSIS — J188 Other pneumonia, unspecified organism: Secondary | ICD-10-CM | POA: Diagnosis not present

## 2020-06-03 DIAGNOSIS — J9621 Acute and chronic respiratory failure with hypoxia: Secondary | ICD-10-CM | POA: Diagnosis not present

## 2020-06-03 DIAGNOSIS — J9622 Acute and chronic respiratory failure with hypercapnia: Secondary | ICD-10-CM | POA: Diagnosis not present

## 2020-06-03 DIAGNOSIS — J440 Chronic obstructive pulmonary disease with acute lower respiratory infection: Secondary | ICD-10-CM | POA: Diagnosis not present

## 2020-06-03 DIAGNOSIS — I2510 Atherosclerotic heart disease of native coronary artery without angina pectoris: Secondary | ICD-10-CM | POA: Diagnosis not present

## 2020-06-03 DIAGNOSIS — J441 Chronic obstructive pulmonary disease with (acute) exacerbation: Secondary | ICD-10-CM | POA: Diagnosis not present

## 2020-06-04 DIAGNOSIS — G4733 Obstructive sleep apnea (adult) (pediatric): Secondary | ICD-10-CM | POA: Diagnosis not present

## 2020-06-04 DIAGNOSIS — J441 Chronic obstructive pulmonary disease with (acute) exacerbation: Secondary | ICD-10-CM | POA: Diagnosis not present

## 2020-06-04 DIAGNOSIS — J9621 Acute and chronic respiratory failure with hypoxia: Secondary | ICD-10-CM | POA: Diagnosis not present

## 2020-06-04 DIAGNOSIS — I2510 Atherosclerotic heart disease of native coronary artery without angina pectoris: Secondary | ICD-10-CM | POA: Diagnosis not present

## 2020-06-04 DIAGNOSIS — J188 Other pneumonia, unspecified organism: Secondary | ICD-10-CM | POA: Diagnosis not present

## 2020-06-04 DIAGNOSIS — G4731 Primary central sleep apnea: Secondary | ICD-10-CM | POA: Diagnosis not present

## 2020-06-04 DIAGNOSIS — J440 Chronic obstructive pulmonary disease with acute lower respiratory infection: Secondary | ICD-10-CM | POA: Diagnosis not present

## 2020-06-04 DIAGNOSIS — J9622 Acute and chronic respiratory failure with hypercapnia: Secondary | ICD-10-CM | POA: Diagnosis not present

## 2020-06-06 DIAGNOSIS — J188 Other pneumonia, unspecified organism: Secondary | ICD-10-CM | POA: Diagnosis not present

## 2020-06-06 DIAGNOSIS — J441 Chronic obstructive pulmonary disease with (acute) exacerbation: Secondary | ICD-10-CM | POA: Diagnosis not present

## 2020-06-06 DIAGNOSIS — J9621 Acute and chronic respiratory failure with hypoxia: Secondary | ICD-10-CM | POA: Diagnosis not present

## 2020-06-06 DIAGNOSIS — J9622 Acute and chronic respiratory failure with hypercapnia: Secondary | ICD-10-CM | POA: Diagnosis not present

## 2020-06-06 DIAGNOSIS — I2510 Atherosclerotic heart disease of native coronary artery without angina pectoris: Secondary | ICD-10-CM | POA: Diagnosis not present

## 2020-06-06 DIAGNOSIS — J440 Chronic obstructive pulmonary disease with acute lower respiratory infection: Secondary | ICD-10-CM | POA: Diagnosis not present

## 2020-06-07 DIAGNOSIS — J449 Chronic obstructive pulmonary disease, unspecified: Secondary | ICD-10-CM | POA: Diagnosis not present

## 2020-06-07 DIAGNOSIS — K589 Irritable bowel syndrome without diarrhea: Secondary | ICD-10-CM | POA: Diagnosis not present

## 2020-06-07 DIAGNOSIS — J9602 Acute respiratory failure with hypercapnia: Secondary | ICD-10-CM | POA: Diagnosis not present

## 2020-06-07 DIAGNOSIS — D509 Iron deficiency anemia, unspecified: Secondary | ICD-10-CM | POA: Diagnosis not present

## 2020-06-07 DIAGNOSIS — J441 Chronic obstructive pulmonary disease with (acute) exacerbation: Secondary | ICD-10-CM | POA: Diagnosis not present

## 2020-06-07 DIAGNOSIS — I10 Essential (primary) hypertension: Secondary | ICD-10-CM | POA: Diagnosis not present

## 2020-06-07 DIAGNOSIS — E875 Hyperkalemia: Secondary | ICD-10-CM | POA: Diagnosis not present

## 2020-06-07 DIAGNOSIS — J9622 Acute and chronic respiratory failure with hypercapnia: Secondary | ICD-10-CM | POA: Diagnosis not present

## 2020-06-07 DIAGNOSIS — I959 Hypotension, unspecified: Secondary | ICD-10-CM | POA: Diagnosis not present

## 2020-06-07 DIAGNOSIS — R109 Unspecified abdominal pain: Secondary | ICD-10-CM | POA: Diagnosis not present

## 2020-06-07 DIAGNOSIS — J9621 Acute and chronic respiratory failure with hypoxia: Secondary | ICD-10-CM | POA: Diagnosis not present

## 2020-06-07 DIAGNOSIS — R531 Weakness: Secondary | ICD-10-CM | POA: Diagnosis not present

## 2020-06-07 DIAGNOSIS — Z01810 Encounter for preprocedural cardiovascular examination: Secondary | ICD-10-CM | POA: Diagnosis not present

## 2020-06-07 DIAGNOSIS — Z20822 Contact with and (suspected) exposure to covid-19: Secondary | ICD-10-CM | POA: Diagnosis not present

## 2020-06-07 DIAGNOSIS — R0902 Hypoxemia: Secondary | ICD-10-CM | POA: Diagnosis not present

## 2020-06-07 DIAGNOSIS — R197 Diarrhea, unspecified: Secondary | ICD-10-CM | POA: Diagnosis not present

## 2020-06-17 DIAGNOSIS — D72829 Elevated white blood cell count, unspecified: Secondary | ICD-10-CM | POA: Diagnosis not present

## 2020-06-17 DIAGNOSIS — Z20822 Contact with and (suspected) exposure to covid-19: Secondary | ICD-10-CM | POA: Diagnosis not present

## 2020-06-17 DIAGNOSIS — K219 Gastro-esophageal reflux disease without esophagitis: Secondary | ICD-10-CM | POA: Diagnosis not present

## 2020-06-17 DIAGNOSIS — I10 Essential (primary) hypertension: Secondary | ICD-10-CM | POA: Diagnosis not present

## 2020-06-17 DIAGNOSIS — J9622 Acute and chronic respiratory failure with hypercapnia: Secondary | ICD-10-CM | POA: Diagnosis not present

## 2020-06-17 DIAGNOSIS — R6521 Severe sepsis with septic shock: Secondary | ICD-10-CM | POA: Diagnosis not present

## 2020-06-17 DIAGNOSIS — K5289 Other specified noninfective gastroenteritis and colitis: Secondary | ICD-10-CM | POA: Diagnosis not present

## 2020-06-17 DIAGNOSIS — R680 Hypothermia, not associated with low environmental temperature: Secondary | ICD-10-CM | POA: Diagnosis not present

## 2020-06-17 DIAGNOSIS — A414 Sepsis due to anaerobes: Secondary | ICD-10-CM | POA: Diagnosis not present

## 2020-06-17 DIAGNOSIS — J440 Chronic obstructive pulmonary disease with acute lower respiratory infection: Secondary | ICD-10-CM | POA: Diagnosis not present

## 2020-06-17 DIAGNOSIS — R404 Transient alteration of awareness: Secondary | ICD-10-CM | POA: Diagnosis not present

## 2020-06-17 DIAGNOSIS — R0602 Shortness of breath: Secondary | ICD-10-CM | POA: Diagnosis not present

## 2020-06-17 DIAGNOSIS — J9621 Acute and chronic respiratory failure with hypoxia: Secondary | ICD-10-CM | POA: Diagnosis not present

## 2020-06-17 DIAGNOSIS — R069 Unspecified abnormalities of breathing: Secondary | ICD-10-CM | POA: Diagnosis not present

## 2020-06-17 DIAGNOSIS — R6520 Severe sepsis without septic shock: Secondary | ICD-10-CM | POA: Diagnosis not present

## 2020-06-17 DIAGNOSIS — R0902 Hypoxemia: Secondary | ICD-10-CM | POA: Diagnosis not present

## 2020-06-17 DIAGNOSIS — I469 Cardiac arrest, cause unspecified: Secondary | ICD-10-CM | POA: Diagnosis not present

## 2020-06-17 DIAGNOSIS — R4182 Altered mental status, unspecified: Secondary | ICD-10-CM | POA: Diagnosis not present

## 2020-06-17 DIAGNOSIS — A419 Sepsis, unspecified organism: Secondary | ICD-10-CM | POA: Diagnosis not present

## 2020-06-17 DIAGNOSIS — F329 Major depressive disorder, single episode, unspecified: Secondary | ICD-10-CM | POA: Diagnosis not present

## 2020-06-17 DIAGNOSIS — J9600 Acute respiratory failure, unspecified whether with hypoxia or hypercapnia: Secondary | ICD-10-CM | POA: Diagnosis not present

## 2020-06-17 DIAGNOSIS — I2510 Atherosclerotic heart disease of native coronary artery without angina pectoris: Secondary | ICD-10-CM | POA: Diagnosis not present

## 2020-06-17 DIAGNOSIS — R0689 Other abnormalities of breathing: Secondary | ICD-10-CM | POA: Diagnosis not present

## 2020-06-17 DIAGNOSIS — J439 Emphysema, unspecified: Secondary | ICD-10-CM | POA: Diagnosis not present

## 2020-06-17 DIAGNOSIS — J188 Other pneumonia, unspecified organism: Secondary | ICD-10-CM | POA: Diagnosis not present

## 2020-06-17 DIAGNOSIS — J80 Acute respiratory distress syndrome: Secondary | ICD-10-CM | POA: Diagnosis not present

## 2020-06-17 DIAGNOSIS — A0472 Enterocolitis due to Clostridium difficile, not specified as recurrent: Secondary | ICD-10-CM | POA: Diagnosis not present

## 2020-06-17 DIAGNOSIS — J441 Chronic obstructive pulmonary disease with (acute) exacerbation: Secondary | ICD-10-CM | POA: Diagnosis not present

## 2020-07-04 ENCOUNTER — Telehealth (HOSPITAL_COMMUNITY): Payer: Self-pay | Admitting: Psychiatry

## 2020-07-04 NOTE — Telephone Encounter (Signed)
Called to schedule F/U, left voice message

## 2020-07-08 DEATH — deceased
